# Patient Record
Sex: Female | Born: 1985 | Race: White | Hispanic: No | Marital: Single | Smoking: Former smoker
Health system: Southern US, Community
[De-identification: ages and names within clinical notes are randomized; demographics above are authoritative.]

## PROBLEM LIST (undated history)

## (undated) DIAGNOSIS — N879 Dysplasia of cervix uteri, unspecified: Secondary | ICD-10-CM

## (undated) DIAGNOSIS — IMO0002 Reserved for concepts with insufficient information to code with codable children: Secondary | ICD-10-CM

## (undated) DIAGNOSIS — M1990 Unspecified osteoarthritis, unspecified site: Secondary | ICD-10-CM

## (undated) DIAGNOSIS — N200 Calculus of kidney: Secondary | ICD-10-CM

## (undated) DIAGNOSIS — F32A Depression, unspecified: Secondary | ICD-10-CM

## (undated) DIAGNOSIS — F329 Major depressive disorder, single episode, unspecified: Secondary | ICD-10-CM

## (undated) DIAGNOSIS — D649 Anemia, unspecified: Secondary | ICD-10-CM

## (undated) DIAGNOSIS — B009 Herpesviral infection, unspecified: Secondary | ICD-10-CM

## (undated) DIAGNOSIS — D75839 Thrombocytosis, unspecified: Secondary | ICD-10-CM

## (undated) DIAGNOSIS — F419 Anxiety disorder, unspecified: Secondary | ICD-10-CM

## (undated) HISTORY — DX: Anxiety disorder, unspecified: F41.9

## (undated) HISTORY — DX: Calculus of kidney: N20.0

## (undated) HISTORY — DX: Unspecified osteoarthritis, unspecified site: M19.90

## (undated) HISTORY — DX: Major depressive disorder, single episode, unspecified: F32.9

## (undated) HISTORY — DX: Herpesviral infection, unspecified: B00.9

## (undated) HISTORY — PX: GUM SURGERY: SHX658

## (undated) HISTORY — DX: Reserved for concepts with insufficient information to code with codable children: IMO0002

## (undated) HISTORY — DX: Anemia, unspecified: D64.9

## (undated) HISTORY — DX: Depression, unspecified: F32.A

## (undated) HISTORY — DX: Dysplasia of cervix uteri, unspecified: N87.9

---
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
## 2003-01-27 ENCOUNTER — Other Ambulatory Visit: Admission: RE | Admit: 2003-01-27 | Discharge: 2003-01-27 | Payer: Self-pay | Admitting: Gynecology

## 2004-01-30 ENCOUNTER — Other Ambulatory Visit: Admission: RE | Admit: 2004-01-30 | Discharge: 2004-01-30 | Payer: Self-pay | Admitting: Gynecology

## 2005-02-12 ENCOUNTER — Other Ambulatory Visit: Admission: RE | Admit: 2005-02-12 | Discharge: 2005-02-12 | Payer: Self-pay | Admitting: Gynecology

## 2006-02-13 ENCOUNTER — Other Ambulatory Visit: Admission: RE | Admit: 2006-02-13 | Discharge: 2006-02-13 | Payer: Self-pay | Admitting: Gynecology

## 2006-06-30 ENCOUNTER — Ambulatory Visit: Payer: Self-pay | Admitting: Gastroenterology

## 2006-06-30 ENCOUNTER — Ambulatory Visit: Payer: Self-pay | Admitting: *Deleted

## 2006-06-30 LAB — CONVERTED CEMR LAB
Basophils Relative: 0.1 % (ref 0.0–1.0)
Bilirubin, Direct: 0.1 mg/dL (ref 0.0–0.3)
CO2: 25 meq/L (ref 19–32)
Creatinine, Ser: 0.8 mg/dL (ref 0.4–1.2)
Eosinophils Relative: 0.7 % (ref 0.0–5.0)
GFR calc Af Amer: 114 mL/min
Glucose, Bld: 106 mg/dL — ABNORMAL HIGH (ref 70–99)
HCT: 35.1 % — ABNORMAL LOW (ref 36.0–46.0)
Hemoglobin, Urine: NEGATIVE
Hemoglobin: 12.1 g/dL (ref 12.0–15.0)
Ketones, ur: NEGATIVE mg/dL
Leukocytes, UA: NEGATIVE
Lymphocytes Relative: 36.8 % (ref 12.0–46.0)
Monocytes Absolute: 0.4 10*3/uL (ref 0.2–0.7)
Mucus, UA: NEGATIVE
Neutro Abs: 3.4 10*3/uL (ref 1.4–7.7)
Neutrophils Relative %: 56.3 % (ref 43.0–77.0)
Potassium: 4.1 meq/L (ref 3.5–5.1)
RBC / HPF: NONE SEEN
RDW: 12.1 % (ref 11.5–14.6)
Total Bilirubin: 0.7 mg/dL (ref 0.3–1.2)
Total Protein, Urine: NEGATIVE mg/dL
Total Protein: 6.3 g/dL (ref 6.0–8.3)
Urine Glucose: NEGATIVE mg/dL
Urobilinogen, UA: 0.2 (ref 0.0–1.0)
WBC: 6 10*3/uL (ref 4.5–10.5)
pH: 6.5 (ref 5.0–8.0)

## 2007-03-05 ENCOUNTER — Other Ambulatory Visit: Admission: RE | Admit: 2007-03-05 | Discharge: 2007-03-05 | Payer: Self-pay | Admitting: Gynecology

## 2008-03-10 ENCOUNTER — Encounter: Payer: Self-pay | Admitting: Gynecology

## 2008-03-10 ENCOUNTER — Ambulatory Visit: Payer: Self-pay | Admitting: Gynecology

## 2008-03-10 ENCOUNTER — Other Ambulatory Visit: Admission: RE | Admit: 2008-03-10 | Discharge: 2008-03-10 | Payer: Self-pay | Admitting: Gynecology

## 2008-03-17 ENCOUNTER — Ambulatory Visit: Payer: Self-pay | Admitting: Gynecology

## 2008-03-22 ENCOUNTER — Ambulatory Visit: Payer: Self-pay | Admitting: Gynecology

## 2008-09-14 ENCOUNTER — Encounter: Payer: Self-pay | Admitting: Gynecology

## 2008-09-14 ENCOUNTER — Other Ambulatory Visit: Admission: RE | Admit: 2008-09-14 | Discharge: 2008-09-14 | Payer: Self-pay | Admitting: Gynecology

## 2008-09-14 ENCOUNTER — Ambulatory Visit: Payer: Self-pay | Admitting: Gynecology

## 2009-01-30 ENCOUNTER — Ambulatory Visit: Payer: Self-pay | Admitting: Gynecology

## 2009-03-16 ENCOUNTER — Ambulatory Visit: Payer: Self-pay | Admitting: Gynecology

## 2009-03-16 ENCOUNTER — Other Ambulatory Visit: Admission: RE | Admit: 2009-03-16 | Discharge: 2009-03-16 | Payer: Self-pay | Admitting: Gynecology

## 2009-03-29 ENCOUNTER — Ambulatory Visit: Payer: Self-pay | Admitting: Gynecology

## 2009-08-31 ENCOUNTER — Ambulatory Visit: Payer: Self-pay | Admitting: Gynecology

## 2009-08-31 ENCOUNTER — Other Ambulatory Visit: Admission: RE | Admit: 2009-08-31 | Discharge: 2009-08-31 | Payer: Self-pay | Admitting: Gynecology

## 2009-10-04 ENCOUNTER — Ambulatory Visit: Payer: Self-pay | Admitting: Gynecology

## 2009-10-17 ENCOUNTER — Ambulatory Visit: Payer: Self-pay | Admitting: Gynecology

## 2009-11-20 ENCOUNTER — Ambulatory Visit: Payer: Self-pay | Admitting: Gynecology

## 2010-03-22 ENCOUNTER — Ambulatory Visit: Payer: Self-pay | Admitting: Gynecology

## 2010-03-31 HISTORY — PX: CERVICAL BIOPSY  W/ LOOP ELECTRODE EXCISION: SUR135

## 2010-04-05 ENCOUNTER — Other Ambulatory Visit
Admission: RE | Admit: 2010-04-05 | Discharge: 2010-04-05 | Payer: Self-pay | Source: Home / Self Care | Admitting: Gynecology

## 2010-04-12 ENCOUNTER — Ambulatory Visit
Admission: RE | Admit: 2010-04-12 | Discharge: 2010-04-12 | Payer: Self-pay | Source: Home / Self Care | Attending: Gynecology | Admitting: Gynecology

## 2010-04-25 ENCOUNTER — Other Ambulatory Visit: Payer: Self-pay | Admitting: Gynecology

## 2010-04-25 ENCOUNTER — Ambulatory Visit
Admission: RE | Admit: 2010-04-25 | Discharge: 2010-04-25 | Payer: Self-pay | Source: Home / Self Care | Attending: Gynecology | Admitting: Gynecology

## 2010-04-29 ENCOUNTER — Ambulatory Visit
Admission: RE | Admit: 2010-04-29 | Discharge: 2010-04-29 | Payer: Self-pay | Source: Home / Self Care | Attending: Gynecology | Admitting: Gynecology

## 2010-08-16 NOTE — Assessment & Plan Note (Signed)
[facility]                         GASTROENTEROLOGY OFFICE NOTE   Katie, Green                     MRN:          [id number]  DATE:06/30/2006                            DOB:          02-Dec-1985    REFERRING PHYSICIAN:  Benard Rink. Mackler, D.D.S.   NEW GI CONSULTATION   PRIMARY CARE PHYSICIAN:  Oswaldo Conroy Black, DO   REASON FOR REFERRAL:  Dr. Margaretha Seeds asked me to evaluate Katie Green in  consultation regarding chronic intermittent abdominal pains.   Katie Green is a very pleasant 25 year old woman who has had 3 to 4 years  of intermittent left lower quadrant abdominal pain. She says the pains  are acute, they sometimes double her over. They can last generally for 4  to 5 days. She does not have nausea, vomiting, constipation or diarrhea  around the time of these pains. No fevers or chills. She is not clear if  any certain foods tends to exacerbate it. She has had this looked into  in the past with abdominal ultrasound done November 2007, this was  normal. Pelvic ultrasound done November 2007, was also normal. She tells  me she had a urinalysis at Urgent Care 6 to 8 months ago and that was  negative. During the time of pain, she tries ibuprofen. The pain usually  subsides after 4 to 5 days. There is a suspicion that she may have had  diverticulitis through these, but no definite evidence.   She had a repeat bout of this pain beginning on Saturday. It is her  typical left lower quadrant discomfort, severe pain that caused doubling  over. She was however able to work out the past couple of days including  a lot of abdominal exercises. She has had no fevers, chills, nausea,  vomiting. No constipation or diarrhea. Prior to this visit, she had a  CBC, complete metabolic profile and those were both normal. She had a  stat CT scan done of the abdomen and pelvis with IV and oral contrast  and this was normal without any signs of diverticulitis or  other  abdominal pathology.   She currently says her pain is much improved.   REVIEW OF SYSTEMS:  Is noted for stable weight, but otherwise  essentially normal and is available on her nursing intake sheet.   PAST MEDICAL HISTORY:  Intermittent abdominal pains as above.   CURRENT MEDICATIONS:  Yasmin pill.   ALLERGIES:  No known drug allergies.   SOCIAL HISTORY:  Single. 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 Smokes cigarettes and drinks alcohol periodically.   Last menstrual cycle was one week ago and it was normal.   FAMILY HISTORY:  No colon cancer or colon polyps in family.   PHYSICAL EXAMINATION:  Height 5 feet, 3 inches; 105 pounds. Blood  pressure 100/62, pulse 64.  CONSTITUTIONAL: Generally well-appearing.  NEUROLOGIC: Alert and oriented x3.  EYES: Extraocular movements intact.  MOUTH: Oropharynx moist. No lesions.  NECK: Supple. No lymphadenopathy.  CARDIOVASCULAR: HEART: Regular rate and rhythm.  LUNGS:  Clear to auscultation bilaterally.  ABDOMEN: Soft and nontender,  nondistended. Normal bowel sounds.  EXTREMITIES: No lower extremity edema.  SKIN: No rashes or lesions on visible extremities.   ASSESSMENT/PLAN:  A 25 year old woman with chronic intermittent left  lower quadrant discomfort.   CBC, complete metabolic profile and CT scan today were all normal. I  think it is very unlikely she has anything serious going on and there  are no signs of diverticulitis. She has had pelvic ultrasound that was  essentially normal. Perhaps this is a bowel spasm. What is unusual  though is that she has no other bowel symptoms. She has normal bowel  movement on a daily basis. No constipation, diarrhea, bleeding, nausea  or vomiting. I will try her on Levbid which is a twice daily  antispasmodic medicine to see if that helps. Another possibility is that  she has a left-sided kidney stone. She did have a urinalysis in the  past. She tells me it showed no blood.  I will repeat that for her today.  I think it is likely that these are functional discomforts in the fact  that she was able to do a lot of abdominal exercises with this severe  abdominal pain.     Rachael Fee, MD  Electronically Signed    DPJ/MedQ  DD: 06/30/2006  DT: 06/30/2006  Job #: [id number]   cc:   Carma Leaven, DO

## 2010-10-11 ENCOUNTER — Other Ambulatory Visit: Payer: Self-pay | Admitting: Gynecology

## 2010-10-11 ENCOUNTER — Other Ambulatory Visit (HOSPITAL_COMMUNITY)
Admission: RE | Admit: 2010-10-11 | Discharge: 2010-10-11 | Disposition: A | Discharge status: Home / Self Care | Payer: 59 | Source: Ambulatory Visit | Attending: Gynecology | Admitting: Gynecology

## 2010-10-11 ENCOUNTER — Ambulatory Visit (INDEPENDENT_AMBULATORY_CARE_PROVIDER_SITE_OTHER): Payer: 59 | Admitting: Gynecology

## 2010-10-11 DIAGNOSIS — R87619 Unspecified abnormal cytological findings in specimens from cervix uteri: Secondary | ICD-10-CM | POA: Insufficient documentation

## 2010-10-11 DIAGNOSIS — N871 Moderate cervical dysplasia: Secondary | ICD-10-CM

## 2010-10-11 DIAGNOSIS — Z3041 Encounter for surveillance of contraceptive pills: Secondary | ICD-10-CM

## 2010-10-18 ENCOUNTER — Ambulatory Visit (INDEPENDENT_AMBULATORY_CARE_PROVIDER_SITE_OTHER): Payer: 59 | Admitting: Gynecology

## 2010-10-18 ENCOUNTER — Other Ambulatory Visit: Payer: Self-pay | Admitting: Gynecology

## 2010-10-18 DIAGNOSIS — N9089 Other specified noninflammatory disorders of vulva and perineum: Secondary | ICD-10-CM

## 2010-10-18 DIAGNOSIS — N870 Mild cervical dysplasia: Secondary | ICD-10-CM

## 2010-10-18 DIAGNOSIS — N760 Acute vaginitis: Secondary | ICD-10-CM

## 2010-10-22 ENCOUNTER — Telehealth: Payer: Self-pay | Admitting: *Deleted

## 2010-10-22 NOTE — Telephone Encounter (Signed)
PT INFORMED WITH THE BELOW NOTE AND TO F/U PAP IN . RECALL LETTER IN COMPUTER.

## 2010-10-22 NOTE — Telephone Encounter (Signed)
Message copied by Aura Camps on Tue Oct 22, 2010 10:28 AM ------      Message from: Dara Lords      Created: Tue Oct 22, 2010  9:42 AM       Tell patient that her Pap smear showed low-grade changes and I recommend repeat Pap smear in 6 months.

## 2010-11-07 ENCOUNTER — Telehealth: Payer: Self-pay | Admitting: *Deleted

## 2010-11-07 DIAGNOSIS — B9689 Other specified bacterial agents as the cause of diseases classified elsewhere: Secondary | ICD-10-CM

## 2010-11-07 MED ORDER — TINIDAZOLE 500 MG PO TABS: 2.0000 g | ORAL_TABLET | Freq: Once | ORAL | Status: AC

## 2010-11-07 NOTE — Telephone Encounter (Signed)
Tindamax 2 g daily x2 doses is okay but I do not want to give her refills, I want to know if she is having a recurrence.

## 2010-11-07 NOTE — Telephone Encounter (Signed)
PT INFORMED WITH THE BELOW NOTE. 

## 2010-11-07 NOTE — Telephone Encounter (Signed)
PT CALLED STATING THAT SHE THINKS SHE MAY HAVE AN BV INFECTION C/O BAD ODOR WITH LIGHT BROWNISH DISCHARGE. PT WAS GIVEN TINDAMAX ON LAST OFFICE VISIT FOR THIS ON 10/18/10, PT WOULD LIKE TINDAMAX THIS TIME AS WELL WITH REFILLS IF POSSIBLE PER PT REQUEST PLEASE ADVISE.

## 2011-03-12 ENCOUNTER — Ambulatory Visit (INDEPENDENT_AMBULATORY_CARE_PROVIDER_SITE_OTHER): Payer: 59 | Admitting: Internal Medicine

## 2011-03-12 DIAGNOSIS — M545 Low back pain, unspecified: Secondary | ICD-10-CM

## 2011-03-12 DIAGNOSIS — F341 Dysthymic disorder: Secondary | ICD-10-CM

## 2011-03-12 DIAGNOSIS — M79609 Pain in unspecified limb: Secondary | ICD-10-CM

## 2011-03-12 DIAGNOSIS — G4700 Insomnia, unspecified: Secondary | ICD-10-CM

## 2011-04-07 ENCOUNTER — Other Ambulatory Visit (HOSPITAL_COMMUNITY)
Admission: RE | Admit: 2011-04-07 | Discharge: 2011-04-07 | Disposition: A | Discharge status: Home / Self Care | Payer: BC Managed Care – PPO | Source: Ambulatory Visit | Attending: Gynecology | Admitting: Gynecology

## 2011-04-07 ENCOUNTER — Other Ambulatory Visit: Payer: Self-pay | Admitting: Gynecology

## 2011-04-07 ENCOUNTER — Encounter: Payer: Self-pay | Admitting: Gynecology

## 2011-04-07 ENCOUNTER — Ambulatory Visit (INDEPENDENT_AMBULATORY_CARE_PROVIDER_SITE_OTHER): Payer: BC Managed Care – PPO | Admitting: Gynecology

## 2011-04-07 VITALS — BP 112/66 | Ht 63.0 in | Wt 93.0 lb

## 2011-04-07 DIAGNOSIS — Z01419 Encounter for gynecological examination (general) (routine) without abnormal findings: Secondary | ICD-10-CM | POA: Insufficient documentation

## 2011-04-07 DIAGNOSIS — F419 Anxiety disorder, unspecified: Secondary | ICD-10-CM | POA: Insufficient documentation

## 2011-04-07 DIAGNOSIS — IMO0002 Reserved for concepts with insufficient information to code with codable children: Secondary | ICD-10-CM

## 2011-04-07 DIAGNOSIS — Z3041 Encounter for surveillance of contraceptive pills: Secondary | ICD-10-CM

## 2011-04-07 DIAGNOSIS — N879 Dysplasia of cervix uteri, unspecified: Secondary | ICD-10-CM

## 2011-04-07 DIAGNOSIS — B009 Herpesviral infection, unspecified: Secondary | ICD-10-CM | POA: Insufficient documentation

## 2011-04-07 DIAGNOSIS — Z131 Encounter for screening for diabetes mellitus: Secondary | ICD-10-CM

## 2011-04-07 DIAGNOSIS — R87612 Low grade squamous intraepithelial lesion on cytologic smear of cervix (LGSIL): Secondary | ICD-10-CM

## 2011-04-07 DIAGNOSIS — Z13220 Encounter for screening for lipoid disorders: Secondary | ICD-10-CM

## 2011-04-07 DIAGNOSIS — Z1159 Encounter for screening for other viral diseases: Secondary | ICD-10-CM | POA: Insufficient documentation

## 2011-04-07 LAB — GLUCOSE, RANDOM: Glucose, Bld: 93 mg/dL (ref 70–99)

## 2011-04-07 LAB — URINALYSIS, MICROSCOPIC ONLY
Casts: NONE SEEN
Crystals: NONE SEEN

## 2011-04-07 LAB — URINALYSIS, ROUTINE W REFLEX MICROSCOPIC
Hgb urine dipstick: NEGATIVE
Leukocytes, UA: NEGATIVE
Nitrite: NEGATIVE
Protein, ur: 30 mg/dL — AB
Urobilinogen, UA: 0.2 mg/dL (ref 0.0–1.0)

## 2011-04-07 LAB — LIPID PANEL: Total CHOL/HDL Ratio: 2.5 Ratio

## 2011-04-07 MED ORDER — NORETHINDRONE ACET-ETHINYL EST 1-20 MG-MCG PO TABS: 1.0000 | ORAL_TABLET | Freq: Every day | ORAL | Status: DC

## 2011-04-07 NOTE — Patient Instructions (Signed)
Follow up for Pap smear in 6 months. If any problems switching over to the new birth control pill call the office.

## 2011-04-07 NOTE — Progress Notes (Signed)
Katie Green [birth year] [id number]        26 y.o.  for annual exam.  Several issues noted below.  Past medical history,surgical history, medications, allergies, family history and social history were all reviewed and documented in the EPIC chart. ROS:  Was performed and pertinent positives and negatives are included in the history.  Exam: chaperone present Filed Vitals:   04/07/11 1154  BP: 112/66   General appearance  Normal Skin grossly normal Head/Neck normal with no cervical or supraclavicular adenopathy thyroid normal Lungs  clear Cardiac RR, without RMG Abdominal  soft, nontender, without masses, organomegaly or hernia Breasts  examined lying and sitting without masses, retractions, discharge or axillary adenopathy. Pelvic  Ext/BUS/vagina  normal   Cervix  normal  Pap done  Uterus  anteverted, normal size, shape and contour, midline and mobile nontender   Adnexa  Without masses or tenderness    Anus and perineum  normal     Assessment/Plan:  26 y.o. female for annual exam.    1. Cervical dysplasia. Patient has history of LEEP January 2012 showing high-grade dysplasia with clear margins. Her first follow up Pap smear July showed low-grade SIL. Colposcopy and biopsy showed low-grade SIL. Pap was done today. If normal or low-grade we'll plan expectant management with repeat Pap smear in 6 months the patient knows importance of follow up. 2. Birth control. Patient is on Yasmin. She does continue to smoke. I discussed the thrombosis risk to include stroke heart attack DVT. After a lengthy discussion we both agree to switch to a different pill I prescribed the Loestrin 120 equivalent x1 year. Back up contraception first pack was reviewed. 3. Cigarette smoking. I again discussed stop smoking with her. She is very stressed this point and does not want to consider that. 4. Breast health. SBE monthly reviewed. 5. HSV. Patient is having some outbreaks of HSV have been using  intermittent Valtrex. I recommended daily Valtrex. She has a has a prescription through Dr. Merla Riches and will start on daily suppressive therapy. She continues to have breakthrough outbreaks she'll let me know. 6. Anxiety. She is on Celexa and when necessary Xanax per Dr. Merla Riches and will continue follow up with him in reference to this. 7. Health maintenance. Baseline CBC, glucose, lipid profile, urinalysis ordered. Assuming Pap smear low grade or normal then she will follow up in 6 months for repeat Pap smear.    Dara Lords MD, 12:27 PM 04/07/2011

## 2011-04-08 LAB — CBC WITH DIFFERENTIAL/PLATELET
Basophils Absolute: 0 10*3/uL (ref 0.0–0.1)
HCT: 36.9 % (ref 36.0–46.0)
Lymphocytes Relative: 43 % (ref 12–46)
Monocytes Absolute: 0.4 10*3/uL (ref 0.1–1.0)
Neutro Abs: 4 10*3/uL (ref 1.7–7.7)
RDW: 13.1 % (ref 11.5–15.5)
WBC: 7.6 10*3/uL (ref 4.0–10.5)

## 2011-04-14 ENCOUNTER — Other Ambulatory Visit: Payer: Self-pay | Admitting: Gynecology

## 2011-04-15 ENCOUNTER — Telehealth: Payer: Self-pay | Admitting: *Deleted

## 2011-04-15 ENCOUNTER — Ambulatory Visit: Payer: 59 | Admitting: Sports Medicine

## 2011-04-15 ENCOUNTER — Encounter: Payer: Self-pay | Admitting: Gynecology

## 2011-04-15 NOTE — Telephone Encounter (Signed)
Pt calling wanting recent pap results. Please advise

## 2011-04-15 NOTE — Telephone Encounter (Signed)
Tell patient that Pap smear showed ASCUS with negative high-risk HPV. This is less atypia than her last Pap smear. Recommend repeat Pap smear in 6 months.

## 2011-04-15 NOTE — Telephone Encounter (Signed)
Pt informed with the below note, recall letter in computer. 

## 2011-04-22 ENCOUNTER — Ambulatory Visit: Payer: 59 | Admitting: Sports Medicine

## 2011-05-02 ENCOUNTER — Telehealth: Payer: Self-pay

## 2011-05-02 DIAGNOSIS — M79672 Pain in left foot: Secondary | ICD-10-CM

## 2011-05-02 NOTE — Telephone Encounter (Signed)
.  [facility] PT IN NEED OF HER PERCOCET. PLEASE CALL [phone] WHEN READY FOR P/U

## 2011-05-05 ENCOUNTER — Ambulatory Visit: Payer: Self-pay | Admitting: Sports Medicine

## 2011-05-06 ENCOUNTER — Telehealth: Payer: Self-pay

## 2011-05-06 NOTE — Telephone Encounter (Signed)
PT STATES CALLED Friday 05/01/10 FOR RX REFILL ON PERVOCET, NOW SHE IS COMPLETELY OUT PLEASE CALL WHEN READY

## 2011-05-07 ENCOUNTER — Encounter: Payer: Self-pay | Admitting: Internal Medicine

## 2011-05-07 MED ORDER — OXYCODONE-ACETAMINOPHEN 5-325 MG PO TABS: 1.0000 | ORAL_TABLET | Freq: Two times a day (BID) | ORAL | Status: AC | PRN

## 2011-05-07 NOTE — Telephone Encounter (Signed)
Dr. Merla Riches, This chart is in your box.   Thanks, csj

## 2011-05-07 NOTE — Telephone Encounter (Signed)
Pt has been called that Rx is ready to pick up.

## 2011-05-07 NOTE — Telephone Encounter (Signed)
LMOM THAT RX IS READY FOR PICKUP 

## 2011-05-07 NOTE — Telephone Encounter (Signed)
Written 5/325 #60 left ....have asst leave at front and notify pat

## 2011-05-07 NOTE — Telephone Encounter (Signed)
Please pull this chart. csj 

## 2011-05-12 ENCOUNTER — Ambulatory Visit: Payer: Self-pay | Admitting: Sports Medicine

## 2011-05-12 ENCOUNTER — Other Ambulatory Visit: Payer: Self-pay | Admitting: Family Medicine

## 2011-05-12 MED ORDER — HYDROCODONE-IBUPROFEN 7.5-200 MG PO TABS: 1.0000 | ORAL_TABLET | Freq: Two times a day (BID) | ORAL | Status: DC

## 2011-05-13 ENCOUNTER — Other Ambulatory Visit: Payer: Self-pay | Admitting: Physician Assistant

## 2011-05-13 MED ORDER — HYDROCODONE-IBUPROFEN 7.5-200 MG PO TABS: 1.0000 | ORAL_TABLET | Freq: Two times a day (BID) | ORAL | Status: AC

## 2011-05-16 ENCOUNTER — Telehealth: Payer: Self-pay

## 2011-05-16 DIAGNOSIS — G8929 Other chronic pain: Secondary | ICD-10-CM

## 2011-05-16 NOTE — Telephone Encounter (Signed)
Pt would like a refill on her Vicoprofen

## 2011-05-21 NOTE — Telephone Encounter (Signed)
Patient calling again concerning her rx refill on vicoprofen she say no one has returned her call since last week.

## 2011-05-23 MED ORDER — OXYCODONE-ACETAMINOPHEN 5-325 MG PO TABS: 1.0000 | ORAL_TABLET | ORAL | Status: AC

## 2011-05-23 MED ORDER — HYDROCODONE-IBUPROFEN 5-200 MG PO TABS
1.0000 | ORAL_TABLET | Freq: Four times a day (QID) | ORAL | Status: AC | PRN
Start: 2011-05-23 — End: 2011-06-02

## 2011-05-23 NOTE — Telephone Encounter (Signed)
Dr. Merla Riches last saw this patient 03/12/11.  She was referred for her pain and I do not see that he intended to refill the Vicoprofen and Percocet beyond the refill 03/21/2011.  I'm forwarding this to him for review.

## 2011-05-23 NOTE — Telephone Encounter (Signed)
Pt stated that she received her rx for vicoprofen and the pharmacy sent in an old script for BID and pt is currently taking 1 QID.  Also she wants a refill on her Percocet.  Chart is at nurses station

## 2011-05-23 NOTE — Telephone Encounter (Signed)
LMOM that Rxs ready for p//up

## 2011-05-23 NOTE — Telephone Encounter (Signed)
Hasn't gotten to DrFields yet. appt next week Ok to refill Vicopr at qid and give percocet for after workday but should see me before further meds so tell her to mak appt at 104 I printed 2.

## 2011-05-30 ENCOUNTER — Other Ambulatory Visit: Payer: Self-pay | Admitting: Family Medicine

## 2011-05-30 MED ORDER — ALPRAZOLAM 0.5 MG PO TABS: 0.5000 mg | ORAL_TABLET | Freq: Three times a day (TID) | ORAL | Status: AC | PRN

## 2011-06-11 ENCOUNTER — Ambulatory Visit: Payer: 59 | Admitting: Internal Medicine

## 2011-06-21 ENCOUNTER — Telehealth: Payer: Self-pay

## 2011-06-21 MED ORDER — HYDROCODONE-IBUPROFEN 7.5-200 MG PO TABS: 1.0000 | ORAL_TABLET | Freq: Four times a day (QID) | ORAL | Status: DC | PRN

## 2011-06-21 NOTE — Telephone Encounter (Signed)
One of her prescription refills was sent here on Thursday from the pharmacy and they still have not heard from [place].  Please call

## 2011-06-21 NOTE — Telephone Encounter (Signed)
Advised pt that she need an office visit. Pt has an appt with Dr. Merla Riches on 07/02/11.

## 2011-06-21 NOTE — Telephone Encounter (Signed)
Informed pt that rx is sent in

## 2011-06-27 ENCOUNTER — Telehealth: Payer: Self-pay

## 2011-06-27 NOTE — Telephone Encounter (Signed)
Dr. Merla Riches: Patient came in today to see you but the wait was too long. She has an appt with you on Wednesday but needs to speak with you about a few things before then if possible.

## 2011-06-28 NOTE — Telephone Encounter (Signed)
Pt called back again today states she tried to come in today to be eval. For a broken toe on her other foot and the wait was 3 hours and she could not wait she needs to speak with dr Merla Riches about getting hydrocodone called in for her and would like percocet on top of that because of the pain she is in, she cannot tolerate pain in both feet and needs something stronger than we already called in for her

## 2011-06-29 ENCOUNTER — Telehealth: Payer: Self-pay

## 2011-06-29 MED ORDER — HYDROCODONE-IBUPROFEN 7.5-200 MG PO TABS: 1.0000 | ORAL_TABLET | Freq: Four times a day (QID) | ORAL | Status: DC | PRN

## 2011-06-29 MED ORDER — OXYCODONE-ACETAMINOPHEN 5-325 MG PO TABS: 1.0000 | ORAL_TABLET | Freq: Three times a day (TID) | ORAL | Status: DC | PRN

## 2011-06-29 NOTE — Telephone Encounter (Signed)
She has an appointment in 3 days and so I will give her medication until she can get in for further evaluation/she had been scheduled for sports medicine evaluation and I will check on that as well.

## 2011-07-01 ENCOUNTER — Encounter: Payer: Self-pay | Admitting: Sports Medicine

## 2011-07-01 ENCOUNTER — Ambulatory Visit (INDEPENDENT_AMBULATORY_CARE_PROVIDER_SITE_OTHER): Payer: BC Managed Care – PPO | Admitting: Sports Medicine

## 2011-07-01 VITALS — BP 128/86 | HR 108 | Ht 63.0 in | Wt 96.0 lb

## 2011-07-01 DIAGNOSIS — M79609 Pain in unspecified limb: Secondary | ICD-10-CM

## 2011-07-01 DIAGNOSIS — M2170 Unequal limb length (acquired), unspecified site: Secondary | ICD-10-CM | POA: Insufficient documentation

## 2011-07-01 DIAGNOSIS — M79672 Pain in left foot: Secondary | ICD-10-CM | POA: Insufficient documentation

## 2011-07-01 NOTE — Assessment & Plan Note (Signed)
We will fit her for an arch strap  Sports and soles that provide some cushioning to her arch Her orthotics that were made before felt like they were 2 firm  I would like her to try this for 4-6 weeks and see how the foot is doing at that time.

## 2011-07-01 NOTE — Assessment & Plan Note (Signed)
This may be enough to add some stress to her left foot considering her height  We will add a correction to the sports and soles and she should use some correction for any shoes when she exercises or walks or stands for prolonged time

## 2011-07-01 NOTE — Patient Instructions (Signed)
Please use green insoles as much as possible to correct leg length difference  Use arch strap on left foot over painful area when you are on feet a lot - ok to take strap off if it gets too tight  Please follow up in 6 weeks  Thank you for seeing [place] today!

## 2011-07-01 NOTE — Progress Notes (Signed)
  Subjective:    Patient ID: Katie Green, female    DOB: 22-Feb-1986, 26 y.o.   MRN: [id number]  HPI  Pt presents to clinic for evaluation of lt mid foot pain since 05/2010.   Injured when she twisted and stepped awkwardly going downstairs.  She has been evaluated by Dr. Dion Saucier and [place] [facility] and had x-rays that were negative and MRI showed bone bruise.  Has midfoot pain constantly, worse with increased walking. Works as a Sales executive, on feet all day.  Takes vicoprofen and percocet prn for foot pain.  Unable to do normal exercise activities 2/2 pain.   While this is not as severe as when she first injured the foot she continues to have some swelling more in the lateral dorsum of the midfoot on almost every day that she works   Review of Systems     Objective:   Physical Exam Pleasant, thin and in no acute distress  Mid foot swelling on lt more lat than med Lt leg 1 cm shorter than rt- lt 86cm, rt 85 cm No scoliosis   Lt ankle: Exam normal  AT normal  Mid foot squeeze painful There is some moderate swelling over the lis franc joint laterally Cuboid is not unstable Movement of forefoot while stabilizing the midfoot is painful  Forefoot squeeze non painful Good preservation of arches bilat   MSK ultrasound   in the lis franc joint there is a small calcification and some slight increase of hypoechoic fluid   the lateral TMT joints are normal The fourth metatarsal shaft show some possible callus No sign of stress fracture is noted Ankle ligaments were scanned and are normal as are the tendons medially and laterally      Assessment & Plan:

## 2011-07-02 ENCOUNTER — Ambulatory Visit (INDEPENDENT_AMBULATORY_CARE_PROVIDER_SITE_OTHER): Payer: BC Managed Care – PPO | Admitting: Internal Medicine

## 2011-07-02 VITALS — BP 125/79 | HR 112 | Temp 98.0°F | Resp 22 | Ht 64.0 in | Wt 97.6 lb

## 2011-07-02 DIAGNOSIS — F17200 Nicotine dependence, unspecified, uncomplicated: Secondary | ICD-10-CM | POA: Insufficient documentation

## 2011-07-02 DIAGNOSIS — F341 Dysthymic disorder: Secondary | ICD-10-CM

## 2011-07-02 DIAGNOSIS — F419 Anxiety disorder, unspecified: Secondary | ICD-10-CM

## 2011-07-02 DIAGNOSIS — M79672 Pain in left foot: Secondary | ICD-10-CM

## 2011-07-02 MED ORDER — CITALOPRAM HYDROBROMIDE 40 MG PO TABS: 40.0000 mg | ORAL_TABLET | Freq: Every day | ORAL | Status: DC

## 2011-07-02 MED ORDER — HYDROCODONE-IBUPROFEN 7.5-200 MG PO TABS: 1.0000 | ORAL_TABLET | Freq: Four times a day (QID) | ORAL | Status: AC | PRN

## 2011-07-02 MED ORDER — ALPRAZOLAM 0.5 MG PO TABS: 0.5000 mg | ORAL_TABLET | Freq: Three times a day (TID) | ORAL | Status: DC | PRN

## 2011-07-02 NOTE — Progress Notes (Signed)
  Subjective:    Patient ID: Katie Green, female    DOB: 1985-05-16, 26 y.o.   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 Darrick Penna yesterday who has a diagnosis for her chronic foot pain and has started her on therapy-for this she is very grateful She has a recent toe fracture and some problems with her roommate moving out to represent new stressors but for the most part is in the best shape she's been in over the last year She needs alprazolam occasionally for sleep and occasionally in more stressful daytime events Celexa is still working well She continues with Vicoprofen for her foot pain/she has used occasional Percocet in addition but it is hopeful that now we will get away from pain meds altogether over the next 6 months   Review of Systems     Objective:   Physical Exam Vital signs are stable in particular weight Foot exam is as outlined in Dr. Darrick Penna noted for the left foot The right foot has a resolving third toe fracture that is in good position Mood is stable and affect is appropriate      Assessment & Plan:  Problem #1 depression with anxiety and insomnia Plan refill Celexa 40 #90 one daily with 3 refills Refill alprazolam 0.5 mg #90 one 3 times a day when necessary with 5 refills Increase activities and areas of enjoyment to try to establish new relationships  Problem #2 chronic foot pain Refill Vicoprofen #120 one 4 times a day when necessary with reducing doses as improving refill x2  Followup in 3 months sooner if worse/hopefully we can reduce smoking

## 2011-07-09 ENCOUNTER — Telehealth: Payer: Self-pay

## 2011-07-09 MED ORDER — OXYCODONE-ACETAMINOPHEN 5-325 MG PO TABS: 1.0000 | ORAL_TABLET | Freq: Two times a day (BID) | ORAL | Status: AC | PRN

## 2011-07-09 NOTE — Telephone Encounter (Signed)
Percocet refilled to use one twice a day when necessary in addition to Vicoprofen  Following her toe fracture #30/printed

## 2011-07-09 NOTE — Telephone Encounter (Signed)
Dr. Merla Riches would you please review this request.

## 2011-07-09 NOTE — Telephone Encounter (Signed)
LMOM THAT RX IS READY FOR PICKUP 

## 2011-07-09 NOTE — Telephone Encounter (Signed)
PT IN NEED OF HER PERCOCET. PLEASE CALL [phone] WHEN READY FOR P/U

## 2011-07-23 ENCOUNTER — Telehealth: Payer: Self-pay

## 2011-07-23 NOTE — Telephone Encounter (Signed)
Can we do this rx? 

## 2011-07-23 NOTE — Telephone Encounter (Signed)
Cannot refill without OV, she was given percocet for her foot pain in addition to Vicoprofen (not back pain).

## 2011-07-23 NOTE — Telephone Encounter (Signed)
PT REQUESTING RX REFILL ON PERCOSET, PT BACK IS TERRIBLE SHE HAS APPT TO SEE DR Estill Bamberg.

## 2011-07-24 NOTE — Telephone Encounter (Signed)
Spoke with patient and let her know that we cannot refill the percocet without her rtc.

## 2011-07-24 NOTE — Telephone Encounter (Signed)
LMOM to call back

## 2011-07-28 ENCOUNTER — Telehealth: Payer: Self-pay

## 2011-07-28 NOTE — Telephone Encounter (Signed)
No. No refills without OV. If she is in that much pain she needs evaluation.

## 2011-07-28 NOTE — Telephone Encounter (Signed)
Can we refill this.  I seen on 4/24 that we told her no refills without an office visit?

## 2011-07-28 NOTE — Telephone Encounter (Signed)
Pt requesting refill for percocet,states has an appt with dr Clyda Hurdle around may 10.    Best phone [phone]

## 2011-07-29 NOTE — Telephone Encounter (Signed)
Spoke with patient.  She is going to come see Dr Merla Riches today.

## 2011-07-29 NOTE — Telephone Encounter (Signed)
LMOM to call back

## 2011-08-05 ENCOUNTER — Other Ambulatory Visit: Payer: Self-pay | Admitting: Internal Medicine

## 2011-08-12 ENCOUNTER — Ambulatory Visit: Payer: BC Managed Care – PPO | Admitting: Sports Medicine

## 2011-08-16 ENCOUNTER — Other Ambulatory Visit: Payer: Self-pay | Admitting: Internal Medicine

## 2011-08-16 DIAGNOSIS — G8929 Other chronic pain: Secondary | ICD-10-CM

## 2011-08-16 MED ORDER — OXYCODONE-ACETAMINOPHEN 5-325 MG PO TABS: 1.0000 | ORAL_TABLET | Freq: Every day | ORAL | Status: AC

## 2011-08-16 NOTE — Progress Notes (Signed)
Ongoing evaluation with Dr. Darrick Penna in sports medicine and with PT Ellamae Sia She has one refill left on Vicoprofen Will refill Percocet for use at the end of the day her sleep #30 Followup with me before next meds

## 2011-09-05 ENCOUNTER — Other Ambulatory Visit: Payer: Self-pay | Admitting: Internal Medicine

## 2011-09-11 ENCOUNTER — Ambulatory Visit (INDEPENDENT_AMBULATORY_CARE_PROVIDER_SITE_OTHER): Payer: No Typology Code available for payment source | Admitting: Sports Medicine

## 2011-09-11 VITALS — BP 110/60

## 2011-09-11 DIAGNOSIS — M25579 Pain in unspecified ankle and joints of unspecified foot: Secondary | ICD-10-CM

## 2011-09-11 DIAGNOSIS — G90529 Complex regional pain syndrome I of unspecified lower limb: Secondary | ICD-10-CM

## 2011-09-11 DIAGNOSIS — M79609 Pain in unspecified limb: Secondary | ICD-10-CM

## 2011-09-11 DIAGNOSIS — M79672 Pain in left foot: Secondary | ICD-10-CM

## 2011-09-11 DIAGNOSIS — M7750 Other enthesopathy of unspecified foot: Secondary | ICD-10-CM

## 2011-09-11 NOTE — Progress Notes (Signed)
  Subjective:    Patient ID: Katie Green, female    DOB: 01/21/1986, 26 y.o.   MRN: [id number]  HPI  Katie Green is coming back to f/u on her left foot dorsal pain for 1 1/2 year, she is not better. She has been seen since then by dr. Dion Saucier, had x rays, MRI which showed bone brusing, she has been in a cast as well. She was also seen by Dr. Victorino Dike ( foot / ankle) which recommended her to use insoles. She has been taking vicoprofen 4-5 tablets a day. Dr Darrick Penna saw a calcification on the Lisfranc joint in a previous U/S  Patient Active Problem List  Diagnosis  . Cervical dysplasia  . HSV-2 (herpes simplex virus 2) infection  . Anxiety  . Foot pain, left  . Leg length inequality  . Nicotine dependence   Current Outpatient Prescriptions on File Prior to Visit  Medication Sig Dispense Refill  . ALPRAZolam (XANAX) 0.5 MG tablet Take 1 tablet (0.5 mg total) by mouth 3 (three) times daily as needed.  90 tablet  5  . amphetamine-dextroamphetamine (ADDERALL) 20 MG tablet       . citalopram (CELEXA) 40 MG tablet Take 1 tablet (40 mg total) by mouth daily.  90 tablet  1  . ferrous sulfate 325 (65 FE) MG tablet Take 325 mg by mouth daily with breakfast.        . ibuprofen (ADVIL,MOTRIN) 200 MG tablet Take 200 mg by mouth every 6 (six) hours as needed.        Marland Kitchen LIDODERM 5 % APPLY EVERY DAY AS NEEDED  30 each  1  . Multiple Vitamin (MULTIVITAMIN) capsule Take 1 capsule by mouth daily.        . norethindrone-ethinyl estradiol (MICROGESTIN,JUNEL,LOESTRIN) 1-20 MG-MCG tablet Take 1 tablet by mouth daily.  1 Package  11  . valACYclovir (VALTREX) 500 MG tablet Take 500 mg by mouth daily.        No Known Allergies     Review of Systems  Constitutional: Negative for fever, chills, diaphoresis and fatigue.  Musculoskeletal: Positive for gait problem.  Neurological: Negative for weakness and numbness.       Objective:   Physical Exam  Constitutional: She is oriented to person, place, and time. She  appears well-developed and well-nourished.       BP 110/60   Pulmonary/Chest: Effort normal.  Musculoskeletal:       Left foot pain in the lateral dorsum of the foot. TTP over the sinus tarsi. TTP in the anterior syndesmosis.   Neurological: She is alert and oriented to person, place, and time.  Skin: Skin is warm. No rash noted. No erythema.  Psychiatric: She has a normal mood and affect. Her behavior is normal. Thought content normal.    MSK U/S of both sinus tarsi with no pathology.      Assessment & Plan:   1. Foot pain, left   2. RSD lower limb   3. Sinus tarsi syndrome    amitriptyline 25mg  at bed time for RSD Body hellix ankle strap We could try a sinus tarsi injection if the elavil fails. F/U in 4 weeks.

## 2011-09-12 ENCOUNTER — Telehealth: Payer: Self-pay

## 2011-09-12 NOTE — Telephone Encounter (Signed)
PATIENT WAS REFERRED BY [facility]OL[facility] TO DR. FIELDS FOR HER FOOT.  IT IS STILL NOT BETTER.  DR. FIELDS WOULD RATHER [facility] REFILL HER PERCOCET.  CAN YOU PLEASE REFILL FOR HER.

## 2011-09-15 ENCOUNTER — Encounter: Payer: Self-pay | Admitting: Radiology

## 2011-09-15 ENCOUNTER — Telehealth: Payer: Self-pay

## 2011-09-15 NOTE — Telephone Encounter (Signed)
Patient still waiting for Rx refill. Has been waiting since Friday and is in a lot of pain. Please call asap.

## 2011-09-15 NOTE — Telephone Encounter (Signed)
Dr. 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

## 2011-09-15 NOTE — Telephone Encounter (Signed)
Left message for patient to call back  

## 2011-09-15 NOTE — Telephone Encounter (Signed)
Not getting any relief with the vicoprofen. I did tell her she needs to be stepped off the medications per Dr. Darrick Penna. I did tell her that she will eventually be off all these meds all together. She wonders if there is anything over the counter she can take with the vicoprofen.

## 2011-09-16 NOTE — Telephone Encounter (Signed)
Encounter made in error. 

## 2011-09-17 NOTE — Telephone Encounter (Signed)
LMOM to call back

## 2011-09-17 NOTE — Telephone Encounter (Signed)
With reflex sympathetic dystrophy causing the pain the pain slowly improves as amitriptyline is slowly increased Can add tylenol 2 tabs 4 x a day OTC. Try to be patient-this next period will be a little tough but she should start to see improvement by the time she sees dr fields again

## 2011-09-18 NOTE — Telephone Encounter (Signed)
LMOM TO CB 

## 2011-09-19 NOTE — Telephone Encounter (Signed)
LMOM for pt w/ instr's from Dr Merla Riches and message about when to expect improvement. Asked for CB w/any further ?s/concerns

## 2011-09-19 NOTE — Telephone Encounter (Signed)
Pt CB bc she had not listened to my VM, and agreed to plan although she is still having a lot of pain. Pt asked about RF of Vicoprofen bc when she went to p/up last RF on 5/24, she only got #100 of the #120 d/t ins change. Then when she went to get last RF, she was only given #20 on 09/14/11 and has no further RFs. Verified w/pharmacy that orig Rx was on 4/6 for #120 w/ 2 RFs. Pt got another #120 on 4/29, then #100 on 5/24 and #20 on 6/16, so she has used up her RFs. Dr Merla Riches do you want to write new Rx for more vicoprofen for pt?

## 2011-09-23 MED ORDER — HYDROCODONE-IBUPROFEN 7.5-200 MG PO TABS: 1.0000 | ORAL_TABLET | Freq: Four times a day (QID) | ORAL | Status: DC | PRN

## 2011-09-23 NOTE — Telephone Encounter (Signed)
So next rx by schedule is 7/3 or so I'll print it

## 2011-09-23 NOTE — Addendum Note (Signed)
Addended by: Tonye Pearson on: 09/23/2011 02:14 PM   Modules accepted: Orders

## 2011-09-24 NOTE — Telephone Encounter (Signed)
Okay to call the pharmacy and allow early refill/but only this one time

## 2011-09-24 NOTE — Telephone Encounter (Signed)
Pt CB and apologized for having to CB but she stated that the Rx is written for her to take Q6 hrs, and she is having to take it that often to keep pain level down - she gets up at night to take her dose. So the #20 she got on 6/16 only lasts 5 days and she can not p/up new RF until 7/3. Can we call to allow her to fill now?

## 2011-09-24 NOTE — Telephone Encounter (Signed)
LMOM for pt that Rx is on hold at pharmacy to be filled 7/3 or after.

## 2011-09-25 NOTE — Telephone Encounter (Signed)
LMOM for pt that pharmacy called to give OK to fill early, but only this time - should have enough for a month. [facility] now.

## 2011-10-01 ENCOUNTER — Encounter: Payer: Self-pay | Admitting: Internal Medicine

## 2011-10-01 ENCOUNTER — Ambulatory Visit (INDEPENDENT_AMBULATORY_CARE_PROVIDER_SITE_OTHER): Payer: No Typology Code available for payment source | Admitting: Internal Medicine

## 2011-10-01 VITALS — BP 124/74 | HR 137 | Temp 98.7°F | Resp 16 | Ht 63.0 in | Wt 97.4 lb

## 2011-10-01 DIAGNOSIS — F17200 Nicotine dependence, unspecified, uncomplicated: Secondary | ICD-10-CM

## 2011-10-01 DIAGNOSIS — F419 Anxiety disorder, unspecified: Secondary | ICD-10-CM

## 2011-10-01 DIAGNOSIS — M79673 Pain in unspecified foot: Secondary | ICD-10-CM

## 2011-10-01 DIAGNOSIS — F32A Depression, unspecified: Secondary | ICD-10-CM | POA: Insufficient documentation

## 2011-10-01 DIAGNOSIS — M79672 Pain in left foot: Secondary | ICD-10-CM

## 2011-10-01 DIAGNOSIS — F329 Major depressive disorder, single episode, unspecified: Secondary | ICD-10-CM

## 2011-10-01 DIAGNOSIS — F411 Generalized anxiety disorder: Secondary | ICD-10-CM

## 2011-10-01 DIAGNOSIS — F341 Dysthymic disorder: Secondary | ICD-10-CM

## 2011-10-01 DIAGNOSIS — M79609 Pain in unspecified limb: Secondary | ICD-10-CM

## 2011-10-01 MED ORDER — OXYCODONE-ACETAMINOPHEN 5-325 MG PO TABS: 1.0000 | ORAL_TABLET | Freq: Every day | ORAL | Status: DC

## 2011-10-01 MED ORDER — PIROXICAM 20 MG PO CAPS: 20.0000 mg | ORAL_CAPSULE | Freq: Every day | ORAL | Status: AC

## 2011-10-01 MED ORDER — HYDROCODONE-IBUPROFEN 7.5-200 MG PO TABS: 1.0000 | ORAL_TABLET | Freq: Four times a day (QID) | ORAL | Status: DC | PRN

## 2011-10-01 MED ORDER — HYDROCODONE-ACETAMINOPHEN 10-325 MG PO TABS: 1.0000 | ORAL_TABLET | Freq: Four times a day (QID) | ORAL | Status: AC | PRN

## 2011-10-01 MED ORDER — LIDOCAINE 5 % EX PTCH: 1.0000 | MEDICATED_PATCH | CUTANEOUS | Status: DC

## 2011-10-01 NOTE — Progress Notes (Signed)
Here for three-month followup Patient Active Problem List  Diagnosis     . 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 Anxiety-Still requires Xanax 3 times a day even know her new job is much more suited to her and she has had a very good time there  . Chronic foot pain with RSD-see office notes from sports medicine clinic/she is very optimistic about her involvement there and her potential for getting well-she still requires Vicoprofen 3 or 4 times a day to get through the day at work and is in a tremendous amount of pain when she gets home requiring 1 Percocet at bedtime/she has been able to decrease her use of Percocet/we discussed stopping this entirely the cause of the potential for more dependence and with hydrocodone/amitriptyline does leave her sleepy in the morning and she is reluctant to increase her dose  . Leg length inequality  . Nicotine dependence-Now is not the time to completely quit but she is going to try gradual reduction in the cigarettes    -  Depression-reactive-Very difficult time over the past few months because of her grandfathers aortic valve replacement and the family expecting her to pick up all the pieces with his care at night and on the weekends//For the first time in many months she is spending time with friends over the next week-she realizes she has to make a Change in her relationship with her family/they're all very close/for the most part supportive/her sister has become a problem for her it is no longer her best friend/her sister's children are important to her but she has less contact now   She also complains of a lot of back pain and stiffness in other joints both early in the morning and during the  workday  Exam Vital signs including weight stable Lots of tears during the exam as we discussed her depression and anxiety revolving around recent family events/mood is improved however and affect is appropriate Neurological is  intact   Impression #1 depression and anxiety-continue Celexa and alprazolam/she understands the risk for dependence We'll continue to increase in involvement and positive activities/  #2 chronic foot pain-continue amitriptyline/when this current combination of ibuprofen and Percocet and 6 at the end of this month we'll change her to Norco and stop the Percocet and also use piroxicam 20 mg/she is considering an injection w/ sports medicine  Meds ordered this encounter  Medications  .       . lidocaine (LIDODERM) 5 %    Sig: Place 1 patch onto the skin daily. Remove & Discard patch within 12 hours or as directed by MD    Dispense:  30 patch    Refill:  5  . She has refills of Celexa and Xanax for the next 3 months/              . piroxicam (FELDENE) 20 MG capsule    Sig: Take 1 capsule (20 mg total) by mouth daily.    Dispense:  30 capsule    Refill:  2      to begin 09/23/2011  . HYDROcodone-acetaminophen (NORCO) 10-325 MG per tablet    Sig: Take 1 tablet by mouth every 6 (six) hours as needed for pain.    Dispense:  120 tablet    Refill:  2        to begin 09/23/2011  . oxyCODONE-acetaminophen (PERCOCET) 5-325 MG per tablet    Sig: Take 1 tablet by mouth at bedtime.  Dispense:  25 tablet    Refill:  0        We'll discontinue this on 09/23/2011   Followup 3 months

## 2011-10-07 ENCOUNTER — Other Ambulatory Visit (HOSPITAL_COMMUNITY)
Admission: RE | Admit: 2011-10-07 | Discharge: 2011-10-07 | Disposition: A | Discharge status: Home / Self Care | Payer: No Typology Code available for payment source | Source: Ambulatory Visit | Attending: Gynecology | Admitting: Gynecology

## 2011-10-07 ENCOUNTER — Encounter: Payer: Self-pay | Admitting: Gynecology

## 2011-10-07 ENCOUNTER — Ambulatory Visit (INDEPENDENT_AMBULATORY_CARE_PROVIDER_SITE_OTHER): Payer: No Typology Code available for payment source | Admitting: Gynecology

## 2011-10-07 DIAGNOSIS — R87610 Atypical squamous cells of undetermined significance on cytologic smear of cervix (ASC-US): Secondary | ICD-10-CM

## 2011-10-07 DIAGNOSIS — IMO0001 Reserved for inherently not codable concepts without codable children: Secondary | ICD-10-CM

## 2011-10-07 DIAGNOSIS — Z01419 Encounter for gynecological examination (general) (routine) without abnormal findings: Secondary | ICD-10-CM | POA: Insufficient documentation

## 2011-10-07 NOTE — Patient Instructions (Addendum)
Office will call you with Pap smear results. Assuming normal or low-grade changes and plan repeat in 6 months at your annual exam.

## 2011-10-07 NOTE — Progress Notes (Signed)
Patient has history of LEEP January 2012 showing high-grade dysplasia with clear margins. Her first follow up Pap smear July showed low-grade SIL. Colposcopy and biopsy showed low-grade SIL.  Follow up Pap smear January 2013 showed ASCUS with negative high-risk HPV. Patient here for follow up Pap smear. She is without complaints.  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 Cervix normal. Uterus normal size midline mobile nontender. Adnexa without masses or tenderness.  Assessment and plan: Low grade dysplasia, persistent. Pap done. Assuming low-grade are normal the and plan expected management with repeat Pap in 6 months at her annual exam. Otherwise triage based on results.

## 2011-10-15 ENCOUNTER — Encounter: Payer: Self-pay | Admitting: *Deleted

## 2011-10-15 NOTE — Progress Notes (Signed)
Patient ID: Katie Green, female   DOB: December 10, 1985, 26 y.o.   MRN: [id number] Pt informed of normal recent pap results.

## 2011-10-16 ENCOUNTER — Other Ambulatory Visit: Payer: Self-pay | Admitting: Family Medicine

## 2011-10-16 MED ORDER — HYDROCODONE-IBUPROFEN 7.5-200 MG PO TABS: 1.0000 | ORAL_TABLET | Freq: Four times a day (QID) | ORAL | Status: AC | PRN

## 2011-10-23 ENCOUNTER — Ambulatory Visit: Payer: No Typology Code available for payment source | Admitting: Sports Medicine

## 2011-11-03 ENCOUNTER — Ambulatory Visit (INDEPENDENT_AMBULATORY_CARE_PROVIDER_SITE_OTHER): Payer: No Typology Code available for payment source | Admitting: Sports Medicine

## 2011-11-03 ENCOUNTER — Encounter: Payer: Self-pay | Admitting: Sports Medicine

## 2011-11-03 VITALS — BP 110/60

## 2011-11-03 DIAGNOSIS — M25579 Pain in unspecified ankle and joints of unspecified foot: Secondary | ICD-10-CM

## 2011-11-03 NOTE — Progress Notes (Signed)
  Subjective:    Patient ID: Katie Green, female    DOB: 07-29-1985, 26 y.o.   MRN: [id number]  HPI patient comes in today for followup on her left ankle. She has been followed by Dr. Darrick Penna for this issue. Patient states she was unable to take the Elavil do to drowsiness. Still having pain across the dorsum of her foot, digitally with activity. It is felt to be related to a mild form of complex regional pain syndrome. She's had x-rays and MRI scans which were inconclusive. She was given a body helix ankle wrap which she found uncomfortable. Most comfortable thing she has found is an off the shelf ankle wrap with heat. She denies any swelling.    Review of Systems     Objective:   Physical Exam Well-developed, well-nourished. Left ankle shows full motion without effusion. No soft tissue swelling. No erythema. Some tenderness diffusely across the dorsum of the ankle but nothing focal. Good ligamentous stability. No tenderness over the sinus tarsi. She is neurovascular intact distally and walking without significant limp.       Assessment & Plan:  Chronic left ankle pain-question mild complex regional pain syndrome  I discussed the possibility of trying oral gabapentin, but we will try a topical combination gabapentin/anti-inflammatory medication. She will apply 3 times daily. Explained to her the importance of continued physical activity as a part of her treatment for CRPS. Followup with me in 3-4 weeks. If topical medication is ineffective, we will reconsider oral gabapentin.

## 2011-11-25 ENCOUNTER — Ambulatory Visit: Payer: No Typology Code available for payment source | Admitting: Family Medicine

## 2011-12-12 ENCOUNTER — Other Ambulatory Visit: Payer: Self-pay | Admitting: Internal Medicine

## 2011-12-23 ENCOUNTER — Ambulatory Visit: Payer: No Typology Code available for payment source | Admitting: Family Medicine

## 2011-12-26 ENCOUNTER — Other Ambulatory Visit: Payer: Self-pay | Admitting: Internal Medicine

## 2011-12-26 NOTE — Telephone Encounter (Signed)
Dr. Merla Riches is this refill ok?

## 2011-12-28 NOTE — Telephone Encounter (Signed)
[facility] database inquiry please

## 2011-12-29 ENCOUNTER — Other Ambulatory Visit: Payer: Self-pay | Admitting: Internal Medicine

## 2011-12-31 ENCOUNTER — Encounter: Payer: Self-pay | Admitting: Internal Medicine

## 2011-12-31 ENCOUNTER — Ambulatory Visit (INDEPENDENT_AMBULATORY_CARE_PROVIDER_SITE_OTHER): Payer: No Typology Code available for payment source | Admitting: Internal Medicine

## 2011-12-31 VITALS — BP 108/72 | HR 91 | Temp 98.8°F | Resp 16 | Ht 62.5 in | Wt 98.0 lb

## 2011-12-31 DIAGNOSIS — F329 Major depressive disorder, single episode, unspecified: Secondary | ICD-10-CM

## 2011-12-31 DIAGNOSIS — F341 Dysthymic disorder: Secondary | ICD-10-CM

## 2011-12-31 DIAGNOSIS — J069 Acute upper respiratory infection, unspecified: Secondary | ICD-10-CM

## 2011-12-31 DIAGNOSIS — Z23 Encounter for immunization: Secondary | ICD-10-CM

## 2011-12-31 DIAGNOSIS — M79672 Pain in left foot: Secondary | ICD-10-CM

## 2011-12-31 DIAGNOSIS — F419 Anxiety disorder, unspecified: Secondary | ICD-10-CM

## 2011-12-31 DIAGNOSIS — F411 Generalized anxiety disorder: Secondary | ICD-10-CM

## 2011-12-31 DIAGNOSIS — M79609 Pain in unspecified limb: Secondary | ICD-10-CM

## 2011-12-31 MED ORDER — CITALOPRAM HYDROBROMIDE 40 MG PO TABS: 40.0000 mg | ORAL_TABLET | Freq: Every day | ORAL | Status: DC

## 2011-12-31 MED ORDER — ALPRAZOLAM 0.5 MG PO TABS: 0.5000 mg | ORAL_TABLET | Freq: Three times a day (TID) | ORAL | Status: DC | PRN

## 2011-12-31 MED ORDER — HYDROCODONE-ACETAMINOPHEN 10-325 MG PO TABS: 1.0000 | ORAL_TABLET | Freq: Four times a day (QID) | ORAL | Status: DC | PRN

## 2011-12-31 NOTE — Progress Notes (Signed)
  Subjective:    Patient ID: Katie Green, female    DOB: Jun 25, 1985, 26 y.o.   MRN: [id number]  HPIProblem #1 chronic pain after foot injury foot pain/walks differently so back pain--often wakes in tears with back and foot Pain without any meds incapacitating 10/10 Pain 3/10 with meds-worse as day goes on C. Recent office visit with Dr. Margaretha Sheffield  Problem #2 depression with anxiety and insomnia Still getting a good response from alprazolam during the daytime plus Celexa Sleep is fair to good and sometimes interrupted by pain but rarely by anxiety New job as a much better situation but she is overloaded personally as usual  Problem #3 woke with sore throat today/slight cough/no fever/no running nose/no history of allergies in the fall    Review of Systems Noncontributory No other recent illnesses    Objective:   Physical Exam Vital signs stable TMs and nose clear Throat slightly red with no exudate/no a.c. Or PC nodes Chest clear to auscultation Back mildly tender over the lumbar area with straight spine/is wearing Lidoderm patch on low back Straight leg raise within normal limits past 90 bilaterally Deep tendon reflexes normal Foot not examined Mood stable Judgment sound       Assessment & Plan:  Problem #1 chronic foot pain with back pain likely secondary to altered gait/muscular in origin There is a role for her inactivity as possible etiology We discussed the eventual referral to a chronic pain treatment facility if being aggressive with a sports medicine approach over the next 6 months does not get her completely off narcotics. She will see Dr. Margaretha Sheffield again soon  Problem #2 depression with anxiety and insomnia stable Meds refilled  Problem #3 viral URI-OTC meds Meds ordered this encounter  Medications         . lidocaine (LIDODERM) 5 %    Sig: Place 1 patch onto the skin daily. NEED REFILLS   Remove & Discard patch within 12 hours or as directed by MD  .  oxyCODONE-acetaminophen (PERCOCET/ROXICET) 5-325 MG per tablet    Sig: Take 1 tablet by mouth at bedtime. NEED REFILLS  . valACYclovir (VALTREX) 1000 MG tablet    She has this/may call for refills  . ALPRAZolam (XANAX) 0.5 MG tablet    Sig: Take 1 tablet (0.5 mg total) by mouth 3 (three) times daily as needed.    Dispense:  90 tablet    Refill:  5  . citalopram (CELEXA) 40 MG tablet    Sig: Take 1 tablet (40 mg total) by mouth daily.    Dispense:  90 tablet    Refill:  3  . HYDROcodone-acetaminophen (NORCO) 10-325 MG per tablet    Sig: Take 1 tablet by mouth every 6 (six) hours as needed for pain.    Dispense:  120 tablet    Refill:  5   Followup 6 months

## 2012-01-07 ENCOUNTER — Telehealth: Payer: Self-pay

## 2012-01-07 NOTE — Telephone Encounter (Signed)
Pt needing a rx refill on her percacits? [phone]

## 2012-01-08 NOTE — Telephone Encounter (Signed)
Patient called today for Percocet, looks like you have changed to Hydrocodone. Please advise.

## 2012-01-09 MED ORDER — PREDNISONE 10 MG PO TABS: 10.0000 mg | ORAL_TABLET | Freq: Every day | ORAL | Status: DC

## 2012-01-09 NOTE — Telephone Encounter (Signed)
She has appt on Monday with Dr Margaretha Sheffield. She states the Hydrocodone not effective. She wants to try the Prednisone, so I have sent this in for her. She can not come in tomorrow because she has family trip planned to [facility].

## 2012-01-09 NOTE — Telephone Encounter (Signed)
Her foot pain that is chronic and now thought to be due to complex regional pain syndrome Consultants(Dr fields and Dr Margaretha Sheffield) have suggested stopping oxycod and beginning the trial of those things that might work to put this in remission Elavil made her too sleepy, so they switched to a topical neurontin If 40mg  a day hydrocodone plus the topical is not effective then the next step is prednisone 10mg  tid for 5days, bid for 5days and then qd for 5 days If it would be better, I can see her Sat 8-2 to go over this (her last appt was cut short by time constraints)

## 2012-01-09 NOTE — Telephone Encounter (Signed)
Left message for her to call me back so I can discuss with her.

## 2012-01-11 NOTE — Telephone Encounter (Signed)
Will you please do a database inquiry?

## 2012-01-12 ENCOUNTER — Ambulatory Visit: Payer: No Typology Code available for payment source | Admitting: Sports Medicine

## 2012-01-12 NOTE — Telephone Encounter (Signed)
Database is in your box

## 2012-01-14 ENCOUNTER — Telehealth: Payer: Self-pay

## 2012-01-14 NOTE — Telephone Encounter (Signed)
Pt is breaking out from the predniSONE (DELTASONE) 10 MG tablet Please call [phone]

## 2012-01-14 NOTE — Telephone Encounter (Signed)
She has facial redness, it feels hot. I advised her this is side effect from the Prednisone, but is not an allergy. She said she is having trouble with this dose, she is advised to only take bid, since she has side effects from the prednisone. Dr Theotis Barrio really wants her to try this medication, to see if it will help her foot and decrease her narcotic use.

## 2012-01-14 NOTE — Telephone Encounter (Signed)
Noted. Please call patient and get an update prior to close.

## 2012-01-14 NOTE — Telephone Encounter (Signed)
Called pt, LMOM to CB. 

## 2012-01-15 NOTE — Telephone Encounter (Signed)
Patient called just after 5 to speak to Amy but she was leaving. Patient would like to just speak to clinical staff about her status.

## 2012-01-15 NOTE — Telephone Encounter (Signed)
Called patient, I left at 5:30 , got message this am. Have left message with patient to see how she is feeling.

## 2012-01-16 ENCOUNTER — Ambulatory Visit (INDEPENDENT_AMBULATORY_CARE_PROVIDER_SITE_OTHER): Payer: No Typology Code available for payment source | Admitting: Sports Medicine

## 2012-01-16 VITALS — BP 130/70 | Ht 63.0 in | Wt 98.0 lb

## 2012-01-16 DIAGNOSIS — M79609 Pain in unspecified limb: Secondary | ICD-10-CM

## 2012-01-16 DIAGNOSIS — M79672 Pain in left foot: Secondary | ICD-10-CM

## 2012-01-16 MED ORDER — GABAPENTIN 100 MG PO CAPS: 100.0000 mg | ORAL_CAPSULE | Freq: Three times a day (TID) | ORAL | Status: DC

## 2012-01-16 NOTE — Telephone Encounter (Signed)
LMOM to CB. 

## 2012-01-16 NOTE — Patient Instructions (Addendum)
Thank you for coming in today. Take the gabapentin at night.  After 1-2 weeks add a lunch time dose.  After 3-4 weeks add the breakfast dose.  You can increase you night time dose to 3 pills at night if you want also.   Let us know how this goes.  Also try ... Capsaicin cream. Dont get it in your eyes.   Come back in 4-6 weeks or sooner if needed.

## 2012-01-16 NOTE — Progress Notes (Signed)
Katie Green is a 26 y.o. female who presents to [facility] today for followup left foot pain.  Chronic dorsal left foot pain following an inversion injury about a year ago.  She has had several MRIs and x-rays all not showing significant structural abnormality.  She is thought to have complex regional pain syndrome.   She has been tried on opiates, amitriptyline and a topical compounded cream containing ketoprofen, baclofen, cyclobenzaprine, gabapentin, and lidocaine.  This helped however she has to apply it frequently and it is inconvenient at work.  She feels well otherwise without any radiating pain weakness or numbness.  Her primary care provider recently started her on a trial of prednisone for this.  She has not had gabapentin or Cymbalta yet.  She is able to work and go about her activities of daily living however it is bothersome.  Activity in contact with her skin worsen her pain.    PMH reviewed. Anxiety History  Substance Use Topics  . Smoking status: Current Every Day Smoker -- 0.5 packs/day    Types: Cigarettes  . Smokeless tobacco: Never Used  . Alcohol Use: Yes     rare   ROS as above otherwise neg   Exam:  BP 130/70  Ht 5\' 3"  (1.6 m)  Wt 98 lb (44.453 kg)  BMI 17.36 kg/m2  LMP 12/10/2011 Gen: Well NAD MSK: Left foot. Normal appearing normal motion mildly tender over the dorsal aspect of her foot.  Skin is sensitive to touch.  We'll foot motion strength capillary refill and pulses.

## 2012-01-16 NOTE — Assessment & Plan Note (Signed)
Discussed options with patient. Diagnosis at this time appears to be slightly uncertain, however leaning to complex regional pain syndrome.   Plan: Gabapentin starting a low dose with titration. Followup in 6 weeks.  Additionally discussed capsaicin cream.   May also consider Cymbalta in the future

## 2012-01-19 ENCOUNTER — Telehealth: Payer: Self-pay | Admitting: Family Medicine

## 2012-01-19 NOTE — Telephone Encounter (Signed)
Left message on machine to call back  

## 2012-01-20 NOTE — Telephone Encounter (Signed)
Pt called and ask to speak with Amy. [phone]

## 2012-01-20 NOTE — Telephone Encounter (Signed)
Called patient again, about the facial flushing with the prednisone. Left detailed message for her to call back if she is still symptomatic.

## 2012-01-21 ENCOUNTER — Telehealth: Payer: Self-pay | Admitting: Family Medicine

## 2012-01-21 NOTE — Telephone Encounter (Signed)
Spoke with patient patient will just come back in to be evaluated because she just cant take the pain any more

## 2012-01-25 ENCOUNTER — Other Ambulatory Visit: Payer: Self-pay | Admitting: Internal Medicine

## 2012-02-04 ENCOUNTER — Telehealth: Payer: Self-pay

## 2012-02-04 NOTE — Telephone Encounter (Signed)
Pt needs pain meds to get her to the ninth.  Insurance will not cover the HYDROcodone-acetaminophen (NORCO) 10-325 MG per tablet It was filled on the 13th, only covered for 25 days.  [facility]      CBN: [id number]

## 2012-02-04 NOTE — Telephone Encounter (Signed)
Left message for patient to return call.

## 2012-02-04 NOTE — Telephone Encounter (Signed)
I am a little confused - she was given #120 on 10/3.  Please clarify for me.

## 2012-02-05 ENCOUNTER — Telehealth: Payer: Self-pay

## 2012-02-05 NOTE — Telephone Encounter (Signed)
Left message for patient to return.

## 2012-02-05 NOTE — Telephone Encounter (Signed)
PATIENT CALLED WANTING TO SPEAK TO AMY ABOUT NEEDING THE DR. TO VERIFY A PRESCRIPTION FILLED AT THE PHARMACY. THANK YOU! PLEASE CALL BACK AS SOON AS YOU CAN.

## 2012-02-05 NOTE — Telephone Encounter (Signed)
Gave pt info and she will call pharmacy back to see if they misquoted her the price and/or if the rest of Rx could be transferred to another pharmacy that wouldn't cost so much to pay OOP. Pt stated she will CB if she can't pay OOP and ask [place] to send request to Dr Merla Riches to see if he will Rx another lower strength or anything else that ins might cover for the next two days.

## 2012-02-05 NOTE — Telephone Encounter (Signed)
Advised pt of 2 options and she is not able to pay the $90 + it was going to cost to pay OOP for the rest. 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 If it takes a day or two to get a quantity override done it won't help her tonight and tomorrow. Pt asks if there is any other medication or different strength that we could call in for her for the two days?

## 2012-02-05 NOTE — Telephone Encounter (Signed)
Branded Norco should cost about $70 for #30, generic about $18 for #30.  She should find another pharmacy, or question hers.  Sounds like their charges are out of line.

## 2012-02-05 NOTE — Telephone Encounter (Signed)
Pt Katie Green and said that it will only cost her about $12 for #15 tablets and thought that we would have to call in another Rx for that. I advised pt that pharmacy should be able to fill up to #20 tablets for her off of the Rx they already have since they only gave her #100 of them. Pt thanked [place] and will contact them. She will Katie Green if they can not do that.

## 2012-02-05 NOTE — Telephone Encounter (Signed)
Pt returned call and wanted [place] to document that she was still having prescription issues.    [phone]

## 2012-02-05 NOTE — Telephone Encounter (Signed)
Pt called back and LM on nurse VM explaining that her insurance only covers #100 hydrocodone at a time so that is all she gets out of the #120 we Rx, which only lasts her 25 days. Her insurance will not cover her next RF until 02/07/12 and she stated that she can not go that long w/out some type of pain medication and that she is barely able to tolerate the pain w/the medication. Pt requests that we send in something for her to help her pain until the 9th. [facility] and verified that all of this information is correct.

## 2012-02-05 NOTE — Telephone Encounter (Signed)
1. She can pay out of pocket for the remaining 20 tabs 2. She can contact her insurance to see if we can get authorization for a higher quantity than they allow.

## 2012-02-06 NOTE — Telephone Encounter (Signed)
Pt LM on nurse VM stating that she tried to get the pharm to fill some of the remaining tablets of hydrocodone that was on her Rx and pay OOP for them and was told that we needed to call and give them permission. [facility] and gave them permission. See notes under last phone message. Notified pt on VM

## 2012-02-09 ENCOUNTER — Other Ambulatory Visit: Payer: Self-pay | Admitting: Radiology

## 2012-02-25 ENCOUNTER — Ambulatory Visit: Payer: No Typology Code available for payment source | Admitting: Sports Medicine

## 2012-03-04 ENCOUNTER — Ambulatory Visit (INDEPENDENT_AMBULATORY_CARE_PROVIDER_SITE_OTHER): Payer: No Typology Code available for payment source | Admitting: Family Medicine

## 2012-03-04 VITALS — BP 114/72 | HR 108 | Temp 98.4°F | Resp 14 | Ht 63.0 in | Wt 103.8 lb

## 2012-03-04 DIAGNOSIS — S37019A Minor contusion of unspecified kidney, initial encounter: Secondary | ICD-10-CM

## 2012-03-04 DIAGNOSIS — S37011A Minor contusion of right kidney, initial encounter: Secondary | ICD-10-CM

## 2012-03-04 DIAGNOSIS — R319 Hematuria, unspecified: Secondary | ICD-10-CM

## 2012-03-04 DIAGNOSIS — M549 Dorsalgia, unspecified: Secondary | ICD-10-CM

## 2012-03-04 LAB — POCT URINALYSIS DIPSTICK
Ketones, UA: NEGATIVE
Leukocytes, UA: NEGATIVE
pH, UA: 6.5

## 2012-03-04 LAB — POCT UA - MICROSCOPIC ONLY
Casts, Ur, LPF, POC: NEGATIVE
Crystals, Ur, HPF, POC: NEGATIVE
Mucus, UA: NEGATIVE
Yeast, UA: NEGATIVE

## 2012-03-04 MED ORDER — OXYCODONE-ACETAMINOPHEN 5-325 MG PO TABS: ORAL_TABLET | ORAL | Status: DC
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
## 2012-03-04 NOTE — Patient Instructions (Addendum)
Percocet for severe pain  Drink lots of fluids to keep lotion your kidneys out well  Renal ultrasound will be scheduled for you as soon as we can.

## 2012-03-05 ENCOUNTER — Ambulatory Visit
Admission: RE | Admit: 2012-03-05 | Discharge: 2012-03-05 | Disposition: A | Discharge status: Home / Self Care | Payer: No Typology Code available for payment source | Source: Ambulatory Visit | Attending: Family Medicine | Admitting: Family Medicine

## 2012-03-05 DIAGNOSIS — S37011A Minor contusion of right kidney, initial encounter: Secondary | ICD-10-CM

## 2012-03-05 DIAGNOSIS — R319 Hematuria, unspecified: Secondary | ICD-10-CM

## 2012-03-05 DIAGNOSIS — M549 Dorsalgia, unspecified: Secondary | ICD-10-CM

## 2012-03-09 ENCOUNTER — Telehealth: Payer: Self-pay

## 2012-03-09 DIAGNOSIS — R319 Hematuria, unspecified: Secondary | ICD-10-CM

## 2012-03-09 NOTE — Telephone Encounter (Signed)
Left message for her to call back, advised [place] normal she needs to return, AND call me

## 2012-03-09 NOTE — Telephone Encounter (Signed)
PT STATES SHE HAD AN ULTRA SOUND DONE AND WOULD LIKE TO KNOW RESULTS PLEASE CALL [phone]. STATES SHE IS STILL IN PAIN

## 2012-03-09 NOTE — Telephone Encounter (Signed)
PATIENT CALLING us BACK PLEASE CALL HER AT [phone]

## 2012-03-09 NOTE — Telephone Encounter (Signed)
Call patient. U/S is normal.  RTC if not improving.  To ER if pain has worsened.

## 2012-03-10 NOTE — Telephone Encounter (Signed)
Dr Alwyn Ren, pt called back and reports that her back pain is about the same. She has not seen any blood in her urine lately, but since [place] was normal, she is requesting that we go ahead and refer her to a specialist to have this checked as soon as possible. Your OV notes had indicated that you wanted to do this, do you want to refer her to a urologist?

## 2012-03-11 ENCOUNTER — Telehealth: Payer: Self-pay

## 2012-03-11 DIAGNOSIS — R319 Hematuria, unspecified: Secondary | ICD-10-CM

## 2012-03-11 NOTE — Telephone Encounter (Signed)
The patient called, very upset, at the way her case has been handled.  The patient stated that she had been seen on 03/04/12 and was sent for renal utlrasound.  She stated she was not called with results, she had to call to get results herself, then she has not heard anything regarding any referrals and she is still in pain.  The patient wants pain medication to treat this pain and she wants the necessary referrals.  Please call the patient at [phone].  The patient states she does not feel that her original issues have been addressed.

## 2012-03-11 NOTE — Telephone Encounter (Signed)
Can someone else please address this message (see phone message from 12/10 also)? I sent message to Dr Alwyn Ren yesterday, but he has not responded yet.

## 2012-03-13 NOTE — Telephone Encounter (Signed)
Per Dr. Frederik Pear note in telephone encounter, pt to be referred for urology consult.  Not sure if this order was put into epic, so I have done so.  Please let pt know to expect a phone call about scheduling this

## 2012-03-13 NOTE — Telephone Encounter (Signed)
Yes.  Refer on to urology.

## 2012-03-15 NOTE — Telephone Encounter (Signed)
Patient has been advised of referral to urology, I have left message, she does not answer the phone. I will be happy to call her back or speak to her if she calls back.

## 2012-03-15 NOTE — Telephone Encounter (Signed)
Called pt advised Urology referral pending

## 2012-03-17 ENCOUNTER — Telehealth: Payer: Self-pay | Admitting: Radiology

## 2012-03-17 NOTE — Telephone Encounter (Signed)
I would like patient rechecked before more is prescribed, either here or at urology.

## 2012-03-17 NOTE — Telephone Encounter (Signed)
I have spoken to patient again. She states she is still painful. I have given her the information to call [facility]. She is asking for a refill of Percocet, I advised her this is normally a one time only prescription, for severe pain. I advised her Dr Alwyn Ren is not in the office and this request must go to  Him only. Please advise.   She states she will call urology.

## 2012-03-18 NOTE — Telephone Encounter (Signed)
patient returned call and notified. She voiced understanding.

## 2012-03-18 NOTE — Telephone Encounter (Signed)
I have called her back to advise, have left message for her to call me back.

## 2012-03-19 ENCOUNTER — Other Ambulatory Visit: Payer: Self-pay | Admitting: Gynecology

## 2012-04-16 ENCOUNTER — Encounter: Payer: Self-pay | Admitting: Gynecology

## 2012-04-16 ENCOUNTER — Other Ambulatory Visit (HOSPITAL_COMMUNITY)
Admission: RE | Admit: 2012-04-16 | Discharge: 2012-04-16 | Disposition: A | Discharge status: Home / Self Care | Payer: No Typology Code available for payment source | Source: Ambulatory Visit | Attending: Gynecology | Admitting: Gynecology

## 2012-04-16 ENCOUNTER — Ambulatory Visit (INDEPENDENT_AMBULATORY_CARE_PROVIDER_SITE_OTHER): Payer: No Typology Code available for payment source | Admitting: Gynecology

## 2012-04-16 VITALS — BP 120/78 | Ht 63.0 in | Wt 98.0 lb

## 2012-04-16 DIAGNOSIS — Z113 Encounter for screening for infections with a predominantly sexual mode of transmission: Secondary | ICD-10-CM

## 2012-04-16 DIAGNOSIS — N879 Dysplasia of cervix uteri, unspecified: Secondary | ICD-10-CM

## 2012-04-16 DIAGNOSIS — Z01419 Encounter for gynecological examination (general) (routine) without abnormal findings: Secondary | ICD-10-CM

## 2012-04-16 DIAGNOSIS — Z13220 Encounter for screening for lipoid disorders: Secondary | ICD-10-CM

## 2012-04-16 DIAGNOSIS — Z23 Encounter for immunization: Secondary | ICD-10-CM

## 2012-04-16 DIAGNOSIS — Z131 Encounter for screening for diabetes mellitus: Secondary | ICD-10-CM

## 2012-04-16 LAB — CBC WITH DIFFERENTIAL/PLATELET
Basophils Absolute: 0 10*3/uL (ref 0.0–0.1)
Basophils Relative: 1 % (ref 0–1)
Eosinophils Absolute: 0.1 10*3/uL (ref 0.0–0.7)
Eosinophils Relative: 1 % (ref 0–5)
HCT: 40.2 % (ref 36.0–46.0)
MCH: 31.8 pg (ref 26.0–34.0)
MCHC: 33.8 g/dL (ref 30.0–36.0)
MCV: 93.9 fL (ref 78.0–100.0)
Monocytes Absolute: 0.4 10*3/uL (ref 0.1–1.0)
Neutro Abs: 5.4 10*3/uL (ref 1.7–7.7)
RDW: 13.4 % (ref 11.5–15.5)

## 2012-04-16 MED ORDER — NORETHINDRONE ACET-ETHINYL EST 1-20 MG-MCG PO TABS: 1.0000 | ORAL_TABLET | Freq: Every day | ORAL | Status: DC

## 2012-04-16 NOTE — Patient Instructions (Signed)
Office will contact you with Pap smear results and then we'll determine when follow up as needed. Otherwise call if you need anything this coming year.

## 2012-04-16 NOTE — Addendum Note (Signed)
Addended by: Dayna Barker on: 04/16/2012 12:44 PM   Modules accepted: Orders

## 2012-04-16 NOTE — Progress Notes (Addendum)
Katie Green [birth year] [id number]        27 y.o.  G0P0 for annual exam.  Several issues noted below.  Past medical history,surgical history, medications, allergies, family history and social history were all reviewed and documented in the EPIC chart. ROS:  Was performed and pertinent positives and negatives are included in the history.  Exam: Kim assistant Filed Vitals:   04/16/12 1149  BP: 120/78  Height: 5\' 3"  (1.6 m)  Weight: 98 lb (44.453 kg)   General appearance  Normal Skin grossly normal Head/Neck normal with no cervical or supraclavicular adenopathy thyroid normal Lungs  clear Cardiac RR, without RMG Abdominal  soft, nontender, without masses, organomegaly or hernia Breasts  examined lying and sitting without masses, retractions, discharge or axillary adenopathy. Pelvic  Ext/BUS/vagina  normal   Cervix  normal Pap  Uterus  anteverted, normal size, shape and contour, midline and mobile nontender   Adnexa  Without masses or tenderness    Anus and perineum  normal      Assessment/Plan:  27 y.o. G0P0 female for annual exam.   1. History of cervical dysplasia.  LEEP January 2012 showing high-grade dysplasia with clear margins. Her first follow up Pap smear July showed low-grade SIL. Colposcopy and biopsy showed low-grade SIL. Follow up Pap smear January 2013 showed ASCUS with negative high-risk HPV.  Pap July 2013 should benign changes.  Pap done today. Follow up for results. 2. Birth control. Patient on Junel doing well and wants to continue. Risks of stroke heart attack DVT reviewed and accepted. Refill x1 year. 3. STD screening. GC Chlamydia screen done at her request. No known exposure. Serum screening declined. 4. Breast health periods SBE monthly reviewed. 5. 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 Will call if she needs refills. 6. Stop smoking. I again encouraged her to stop smoking and reviewed strategies. The association with cervical dysplasia again  reviewed. 7. Health maintenance. Baseline CBC lipid profile glucose urinalysis ordered. Follow up 4 Pap smear results. We'll triage follow up based on these results.    Dara Lords MD, 12:21 PM 04/16/2012

## 2012-04-17 LAB — LIPID PANEL
LDL Cholesterol: 113 mg/dL — ABNORMAL HIGH (ref 0–99)
VLDL: 32 mg/dL (ref 0–40)

## 2012-04-17 LAB — URINALYSIS W MICROSCOPIC + REFLEX CULTURE
Bilirubin Urine: NEGATIVE
Casts: NONE SEEN
Crystals: NONE SEEN
Glucose, UA: NEGATIVE mg/dL
Leukocytes, UA: NEGATIVE
Specific Gravity, Urine: 1.016 (ref 1.005–1.030)
Squamous Epithelial / LPF: NONE SEEN
pH: 7 (ref 5.0–8.0)

## 2012-04-18 LAB — GC/CHLAMYDIA PROBE AMP: GC Probe RNA: NEGATIVE

## 2012-04-19 ENCOUNTER — Other Ambulatory Visit: Payer: Self-pay | Admitting: Gynecology

## 2012-04-19 DIAGNOSIS — E785 Hyperlipidemia, unspecified: Secondary | ICD-10-CM

## 2012-04-23 ENCOUNTER — Telehealth: Payer: Self-pay

## 2012-04-23 NOTE — Telephone Encounter (Signed)
Patient called for pap results. Informed pap normal/negative.

## 2012-05-07 ENCOUNTER — Other Ambulatory Visit: Payer: Self-pay | Admitting: Gynecology

## 2012-06-09 ENCOUNTER — Ambulatory Visit: Payer: No Typology Code available for payment source | Admitting: Internal Medicine

## 2012-06-16 ENCOUNTER — Ambulatory Visit: Payer: No Typology Code available for payment source | Admitting: Internal Medicine

## 2012-07-03 ENCOUNTER — Other Ambulatory Visit: Payer: Self-pay | Admitting: Internal Medicine

## 2012-07-04 NOTE — Telephone Encounter (Signed)
Needs f/u Ok 1 month Last seen 10/13 Meds ordered this encounter  Medications  . HYDROcodone-acetaminophen (NORCO) 10-325 MG per tablet    Sig: TAKE 1 TABLET BY MOUTH EVERY 6 HOURS AS NEEDED FOR PAIN    Dispense:  120 tablet    Refill:  0

## 2012-07-05 NOTE — Telephone Encounter (Signed)
i am not aware that she is on adderall RPD

## 2012-07-05 NOTE — Telephone Encounter (Signed)
Dr Merla Riches, have you done the refill of Adderall, or do you need to wait until you are in office tomorrow?

## 2012-07-05 NOTE — Telephone Encounter (Signed)
Called [facility] and they advised that they do not have the RF req for Adderall in their system and thinks it must have been put in in error and then taken out. I will refuse RF.

## 2012-07-09 ENCOUNTER — Telehealth: Payer: Self-pay

## 2012-07-09 NOTE — Telephone Encounter (Signed)
Called her, she should probably come in for this.. Left message, she is advised to call me back.

## 2012-07-09 NOTE — Telephone Encounter (Signed)
Spoke to patient, she has increased foot pain, swelling, getting worse since last week. States she is scheduled for April 23rd. Stepped wrong last night, now severe. States hydrocodone no relief, states she got fired from her job yesterday. She is tearful, upset.

## 2012-07-09 NOTE — Telephone Encounter (Signed)
PT STATES SHE HAVE HURT HER FOOT AND WOULD LIKE DR DOOLITTLE TO CALL HER IN SOME PERCOCET. PLEASE CALL [phone]   [facility] [address]

## 2012-07-10 ENCOUNTER — Telehealth: Payer: Self-pay

## 2012-07-10 NOTE — Telephone Encounter (Signed)
Implies new injury to old problem/needs evaluation , possible xray

## 2012-07-10 NOTE — Telephone Encounter (Signed)
PATIENT WANTS DOOLITTLE TO KNOW THAT ITS NOT A NEW INJURY TO AN OLD PRE-EXISTING ONE. SHE WANTS TO KNOW IF SHE NEEDS TO COME IN OR HAVE HER RX FILLED? CAN SOMEONE LET HER KNOW. THANK YOU!

## 2012-07-12 NOTE — Telephone Encounter (Signed)
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

## 2012-07-12 NOTE — Telephone Encounter (Signed)
Called her to advise. Left message for her to call me back.  

## 2012-07-13 NOTE — Telephone Encounter (Signed)
She called again , after our conversation, called back left message for her, unsure why she does not understand need for office visit. She is asked about referral to pain management. She hopefully will call back about this.

## 2012-07-13 NOTE — Telephone Encounter (Signed)
Spoke to her, again

## 2012-07-14 ENCOUNTER — Telehealth: Payer: Self-pay | Admitting: *Deleted

## 2012-07-14 DIAGNOSIS — G894 Chronic pain syndrome: Secondary | ICD-10-CM

## 2012-07-14 NOTE — Telephone Encounter (Signed)
Pt returned call and stated that she will go to pain management

## 2012-07-21 ENCOUNTER — Encounter: Payer: Self-pay | Admitting: Internal Medicine

## 2012-07-21 ENCOUNTER — Ambulatory Visit (INDEPENDENT_AMBULATORY_CARE_PROVIDER_SITE_OTHER): Payer: No Typology Code available for payment source | Admitting: Internal Medicine

## 2012-07-21 VITALS — BP 116/76 | HR 101 | Temp 98.4°F | Resp 18 | Ht 63.5 in | Wt 99.0 lb

## 2012-07-21 DIAGNOSIS — F411 Generalized anxiety disorder: Secondary | ICD-10-CM

## 2012-07-21 DIAGNOSIS — IMO0002 Reserved for concepts with insufficient information to code with codable children: Secondary | ICD-10-CM

## 2012-07-21 DIAGNOSIS — F329 Major depressive disorder, single episode, unspecified: Secondary | ICD-10-CM

## 2012-07-21 DIAGNOSIS — F17200 Nicotine dependence, unspecified, uncomplicated: Secondary | ICD-10-CM

## 2012-07-21 DIAGNOSIS — G589 Mononeuropathy, unspecified: Secondary | ICD-10-CM

## 2012-07-21 DIAGNOSIS — M79672 Pain in left foot: Secondary | ICD-10-CM

## 2012-07-21 DIAGNOSIS — F419 Anxiety disorder, unspecified: Secondary | ICD-10-CM

## 2012-07-21 DIAGNOSIS — F341 Dysthymic disorder: Secondary | ICD-10-CM

## 2012-07-21 MED ORDER — ALPRAZOLAM 0.5 MG PO TABS: 0.5000 mg | ORAL_TABLET | Freq: Three times a day (TID) | ORAL | Status: DC | PRN

## 2012-07-21 MED ORDER — CITALOPRAM HYDROBROMIDE 40 MG PO TABS: 40.0000 mg | ORAL_TABLET | Freq: Every day | ORAL | Status: DC

## 2012-07-21 MED ORDER — HYDROCODONE-ACETAMINOPHEN 10-325 MG PO TABS: 1.0000 | ORAL_TABLET | Freq: Four times a day (QID) | ORAL | Status: DC | PRN

## 2012-07-21 MED ORDER — GABAPENTIN 100 MG PO CAPS: ORAL_CAPSULE | ORAL | Status: DC

## 2012-07-21 MED ORDER — OXYCODONE-ACETAMINOPHEN 10-325 MG PO TABS: 1.0000 | ORAL_TABLET | Freq: Every evening | ORAL | Status: DC | PRN

## 2012-07-22 DIAGNOSIS — IMO0002 Reserved for concepts with insufficient information to code with codable children: Secondary | ICD-10-CM | POA: Insufficient documentation

## 2012-07-22 DIAGNOSIS — G5640 Causalgia of unspecified upper limb: Secondary | ICD-10-CM | POA: Insufficient documentation

## 2012-07-22 NOTE — Progress Notes (Signed)
F/u Patient Active Problem List  Diagnosis  . Cervical dysplasia--see gyn note  . HSV-2 (herpes simplex virus 2) infection--none recent  . Anxiety  . Foot pain, left-see sports med notes/rx/now advanced to neurontin 100tid for last 2 months and still no effect on chronic foot pain--their likely dx=Complex regional pain syndrome///also on HC qid with some relief but can't sleep in recent 8 weeks due to pain and burning at night  . Leg length inequality--see sp med note??  . 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 Depression, reactive--incr sxtoms since job loss 2 weeks ago  .    Stable very good new relationship No new health complaints  Exam BP 116/76  Pulse 101  Temp(Src) 98.4 F (36.9 C)  Resp 18  Ht 5' 3.5" (1.613 m)  Wt 99 lb (44.906 kg)  BMI 17.26 kg/m2 HEENT clear Mood fragile/tears at times Affect down NoSI/HI Thought content appropr  IMP Needs eval by Dr Katheran Awe for potential CRPS to find better pain relief Will incr neurontin  To 100/100/300hs in meantime and allow percoct at hs prn for next 2 weeks  Meds ordered this encounter  Medications  . ALPRAZolam (XANAX) 0.5 MG tablet    Sig: Take 1 tablet (0.5 mg total) by mouth 3 (three) times daily as needed.    Dispense:  90 tablet    Refill:  5  . citalopram (CELEXA) 40 MG tablet    Sig: Take 1 tablet (40 mg total) by mouth daily.    Dispense:  90 tablet    Refill:  3  . HYDROcodone-acetaminophen (NORCO) 10-325 MG per tablet    Sig: Take 1 tablet by mouth every 6 (six) hours as needed for pain.    Dispense:  120 tablet    Refill:  5  . gabapentin (NEURONTIN) 100 MG capsule    Sig: 1 am, 1 miday and 3 at hs    Dispense:  150 capsule    Refill:  5  . oxyCODONE-acetaminophen (PERCOCET) 10-325 MG per tablet    Sig: Take 1 tablet by mouth at bedtime as needed for pain.    Dispense:  14 tablet    Refill:  0

## 2012-07-28 ENCOUNTER — Other Ambulatory Visit: Payer: Self-pay | Admitting: Internal Medicine

## 2012-07-28 NOTE — Telephone Encounter (Signed)
Done at appt 4/23

## 2012-07-28 NOTE — Telephone Encounter (Signed)
Done at appt last week.

## 2012-07-29 ENCOUNTER — Other Ambulatory Visit: Payer: Self-pay | Admitting: Internal Medicine

## 2012-08-10 ENCOUNTER — Telehealth: Payer: Self-pay

## 2012-08-10 DIAGNOSIS — IMO0002 Reserved for concepts with insufficient information to code with codable children: Secondary | ICD-10-CM

## 2012-08-10 NOTE — Telephone Encounter (Signed)
Will you check on the referral for pain management?

## 2012-08-10 NOTE — Telephone Encounter (Signed)
PT STATES SHE WAS TO GO [facility], BUT DR DOOLITTLE WANTED TO SEND HER SOMEWHERE ELSE AND SHE HASN'T HEARD FROM ANYONE. WANTED TO KNOW IF SHE NEED TO GO TO HEGE. IS ALSO OUT OF HER PERCOCET AND WANTED TO GET SOME CALLED IN SINCE SHE ISN'T SURE WHEN APPT IS PLEASE CALL [phone]    [facility] [address]

## 2012-08-13 NOTE — Telephone Encounter (Signed)
Pt called today to follow up on a call made back on 05.13.14. I informed her that we are researching the referral for pain management. She then stated that she would like a call back at [phone]. Thanks!

## 2012-08-18 NOTE — Telephone Encounter (Signed)
ok 

## 2012-08-18 NOTE — Telephone Encounter (Signed)
Pt CB to check on status of referral to pain clinic and also request for RF of Percocet to cover her until her appt. I checked w/Susie in Referrals and she reported that the referral had been sent to Dr Vear Clock at [facility] on 07/22/12 and they were supposed to call pt to set up appt. I gave pt phone # to [facility] and advised her to call and see if they can go ahead and schedule her appt. Asked pt to CB to give [place] date of appt if she has success in setting up appt today.  Dr Merla Riches, do you want to RF pt's Percocet?

## 2012-08-20 MED ORDER — OXYCODONE-ACETAMINOPHEN 10-325 MG PO TABS: 1.0000 | ORAL_TABLET | Freq: Every evening | ORAL | Status: DC | PRN

## 2012-08-20 NOTE — Telephone Encounter (Signed)
Pt also reported that she tried to call [facility] to see if she can set up the appt since she has not heard from them yet, and they were closed. She will try to call them back again Tue after the holiday.  LMOM for pt that Rx is ready for p/up.  Dr Merla Riches, [place] Childes. Also I could not close this encounter because your note below was incomplete.

## 2012-08-20 NOTE — Telephone Encounter (Signed)
Ok I sent the Rx per Dr Merla Riches order.

## 2012-08-20 NOTE — Telephone Encounter (Signed)
Dr Florence Canner this Rx but did not print and sign it. Can anyone else do this w/his documented authorization?

## 2012-09-16 ENCOUNTER — Other Ambulatory Visit: Payer: Self-pay

## 2012-09-16 MED ORDER — NORETHINDRONE ACET-ETHINYL EST 1-20 MG-MCG PO TABS: ORAL_TABLET | ORAL | Status: DC

## 2012-09-26 ENCOUNTER — Encounter: Payer: Self-pay | Admitting: Internal Medicine

## 2012-09-27 ENCOUNTER — Telehealth: Payer: Self-pay

## 2012-09-27 NOTE — Telephone Encounter (Signed)
Pt LM again on Vm and stated that it was the original [facility] in [place] that had tried to reach pt, but Dr Merla Riches had decided to send her to Dr Vear Clock at [facility] instead, and this is the office that has not returned pt's calls. Checked w/Donna who will call and ask them to return pt's call to schedule appt.  Dr Merla Riches, please still advise on Percocet RF.

## 2012-09-27 NOTE — Telephone Encounter (Signed)
Pt called and LM on my VM stating that she has called the pain clinic 4-5 times a week and they have not returned her call to set up an appt. I checked w/Referrals and Lupita Leash reported that the pain clinic faxed [place] a notice stating they have not been able to reach the pt, but Lupita Leash stated that the [facility] location is the one that schedules appts for the [place] locations as well. LMOM for pt w/[place]'s phone # to call to see if she can get the appt set up, and asked for CB w/date of appt.  Dr Merla Riches, pt asked if she can have another RF of the Percocet since she has not had any luck getting into the pain clinic yet?

## 2012-09-28 MED ORDER — OXYCODONE-ACETAMINOPHEN 10-325 MG PO TABS: 1.0000 | ORAL_TABLET | Freq: Every evening | ORAL | Status: DC | PRN

## 2012-09-28 NOTE — Telephone Encounter (Signed)
Patient advised; Rx at front desk

## 2012-09-28 NOTE — Telephone Encounter (Signed)
Meds ordered this encounter  Medications  . oxyCODONE-acetaminophen (PERCOCET) 10-325 MG per tablet    Sig: Take 1 tablet by mouth at bedtime as needed for pain.    Dispense:  14 tablet    Refill:  0   Dr Vear Clock would be best

## 2012-09-30 ENCOUNTER — Other Ambulatory Visit: Payer: Self-pay

## 2012-09-30 DIAGNOSIS — G894 Chronic pain syndrome: Secondary | ICD-10-CM

## 2012-10-13 ENCOUNTER — Telehealth: Payer: Self-pay | Admitting: *Deleted

## 2012-10-13 NOTE — Telephone Encounter (Signed)
Pt asked did you want her to come back in for 6 month pap recheck? Annual in Jan 2014, she is scheduled on 10/27/12 for 6 month follow up pap. She couldn't remember if you told her to continue every 6 months. Please advise

## 2012-10-13 NOTE — Telephone Encounter (Signed)
Pap smear January 2015 should be fine one year from her last Pap smear which was normal.

## 2012-10-14 NOTE — Telephone Encounter (Signed)
Left message on pt voicemail, appt. Will be canceled for 10/27/12

## 2012-10-19 ENCOUNTER — Encounter: Payer: Self-pay | Admitting: Internal Medicine

## 2012-10-20 ENCOUNTER — Other Ambulatory Visit: Payer: Self-pay | Admitting: Internal Medicine

## 2012-10-21 MED ORDER — OXYCODONE-ACETAMINOPHEN 10-325 MG PO TABS: 1.0000 | ORAL_TABLET | Freq: Every evening | ORAL | Status: DC | PRN

## 2012-10-26 MED ORDER — OXYCODONE-ACETAMINOPHEN 10-325 MG PO TABS: 1.0000 | ORAL_TABLET | Freq: Every evening | ORAL | Status: DC | PRN

## 2012-10-27 ENCOUNTER — Ambulatory Visit: Payer: No Typology Code available for payment source | Admitting: Gynecology

## 2012-11-02 ENCOUNTER — Other Ambulatory Visit: Payer: Self-pay | Admitting: Radiology

## 2012-11-02 MED ORDER — CITALOPRAM HYDROBROMIDE 40 MG PO TABS: 40.0000 mg | ORAL_TABLET | Freq: Every day | ORAL | Status: DC

## 2012-11-02 NOTE — Telephone Encounter (Signed)
[facility]

## 2012-11-20 ENCOUNTER — Other Ambulatory Visit: Payer: Self-pay | Admitting: Internal Medicine

## 2012-11-22 ENCOUNTER — Encounter: Payer: Self-pay | Admitting: Internal Medicine

## 2012-11-22 NOTE — Telephone Encounter (Signed)
Pended please advise.  

## 2012-11-23 ENCOUNTER — Telehealth: Payer: Self-pay

## 2012-11-23 MED ORDER — OXYCODONE-ACETAMINOPHEN 10-325 MG PO TABS: 1.0000 | ORAL_TABLET | Freq: Every evening | ORAL | Status: DC | PRN

## 2012-11-23 NOTE — Telephone Encounter (Signed)
Please check on status of pain management referral.

## 2012-11-23 NOTE — Telephone Encounter (Signed)
Left message on machine Rx is ready for pick up.

## 2012-12-14 ENCOUNTER — Encounter: Payer: Self-pay | Admitting: Internal Medicine

## 2012-12-15 MED ORDER — OXYCODONE-ACETAMINOPHEN 10-325 MG PO TABS: 1.0000 | ORAL_TABLET | Freq: Three times a day (TID) | ORAL | Status: DC | PRN

## 2012-12-15 NOTE — Telephone Encounter (Signed)
Meds ordered this encounter  Medications  . oxyCODONE-acetaminophen (PERCOCET) 10-325 MG per tablet    Sig: Take 1 tablet by mouth every 8 (eight) hours as needed for pain.    Dispense:  60 tablet    Refill:  0

## 2012-12-16 ENCOUNTER — Telehealth: Payer: Self-pay | Admitting: Radiology

## 2012-12-16 NOTE — Telephone Encounter (Signed)
Katie Green will you check status of the pain management?

## 2012-12-17 NOTE — Telephone Encounter (Signed)
Dr. Merla Riches would like to know why we have not gotten her into pain management yet?  He said he would chang to a different place if needed.  Please send message back to Dr. Merla Riches.

## 2012-12-21 ENCOUNTER — Other Ambulatory Visit: Payer: Self-pay | Admitting: Physician Assistant

## 2013-01-04 ENCOUNTER — Telehealth: Payer: Self-pay | Admitting: Internal Medicine

## 2013-01-04 DIAGNOSIS — IMO0002 Reserved for concepts with insufficient information to code with codable children: Secondary | ICD-10-CM

## 2013-01-04 DIAGNOSIS — M79672 Pain in left foot: Secondary | ICD-10-CM

## 2013-01-04 MED ORDER — OXYCODONE-ACETAMINOPHEN 10-325 MG PO TABS: 1.0000 | ORAL_TABLET | Freq: Three times a day (TID) | ORAL | Status: DC | PRN

## 2013-01-04 NOTE — Telephone Encounter (Signed)
Meds ordered this encounter  Medications  . oxyCODONE-acetaminophen (PERCOCET) 10-325 MG per tablet    Sig: Take 1 tablet by mouth every 8 (eight) hours as needed for pain.    Dispense:  60 tablet    Refill:  0   Complex regional pain syndrome  Foot pain, left  ? Chronic pain eval set yet

## 2013-01-05 NOTE — Telephone Encounter (Signed)
Please advise on status of pain management

## 2013-01-10 ENCOUNTER — Encounter: Payer: Self-pay | Admitting: Internal Medicine

## 2013-01-11 NOTE — Telephone Encounter (Signed)
Will review at appt 10/22

## 2013-01-19 ENCOUNTER — Ambulatory Visit (INDEPENDENT_AMBULATORY_CARE_PROVIDER_SITE_OTHER): Payer: No Typology Code available for payment source | Admitting: Internal Medicine

## 2013-01-19 VITALS — BP 116/82 | HR 105 | Temp 99.5°F | Resp 16 | Ht 63.5 in | Wt 100.0 lb

## 2013-01-19 DIAGNOSIS — Z23 Encounter for immunization: Secondary | ICD-10-CM

## 2013-01-19 DIAGNOSIS — H6121 Impacted cerumen, right ear: Secondary | ICD-10-CM

## 2013-01-19 DIAGNOSIS — F411 Generalized anxiety disorder: Secondary | ICD-10-CM

## 2013-01-19 DIAGNOSIS — F341 Dysthymic disorder: Secondary | ICD-10-CM

## 2013-01-19 DIAGNOSIS — F329 Major depressive disorder, single episode, unspecified: Secondary | ICD-10-CM

## 2013-01-19 DIAGNOSIS — G589 Mononeuropathy, unspecified: Secondary | ICD-10-CM

## 2013-01-19 DIAGNOSIS — F419 Anxiety disorder, unspecified: Secondary | ICD-10-CM

## 2013-01-19 DIAGNOSIS — H6120 Impacted cerumen, unspecified ear: Secondary | ICD-10-CM

## 2013-01-19 DIAGNOSIS — IMO0002 Reserved for concepts with insufficient information to code with codable children: Secondary | ICD-10-CM

## 2013-01-19 DIAGNOSIS — M79672 Pain in left foot: Secondary | ICD-10-CM

## 2013-01-19 MED ORDER — HYDROCODONE-ACETAMINOPHEN 10-325 MG PO TABS: 1.0000 | ORAL_TABLET | Freq: Four times a day (QID) | ORAL | Status: DC | PRN

## 2013-01-19 MED ORDER — OXYCODONE-ACETAMINOPHEN 10-325 MG PO TABS: 1.0000 | ORAL_TABLET | Freq: Three times a day (TID) | ORAL | Status: DC | PRN

## 2013-01-19 MED ORDER — GABAPENTIN 300 MG PO CAPS: 300.0000 mg | ORAL_CAPSULE | Freq: Every day | ORAL | Status: DC

## 2013-01-19 NOTE — Progress Notes (Signed)
  Subjective:    Patient ID: Katie Green, female    DOB: February 06, 1986, 27 y.o.   MRN: [id number]  HPI Patient presents for follow up. Patient Active Problem List   Diagnosis Date Noted  . Complex regional pain syndrome 07/22/2012  . Depression, reactive 10/01/2011  . Nicotine dependence 07/02/2011  . Foot pain, left 07/01/2011  . 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 Cervical dysplasia   . HSV-2 (herpes simplex virus 2) infection   . Anxiety     Still looking for employment. Was offered job at lower pay and after 4 weeks, was very unhappy and resigned. 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   Has not been able to get an appointment with Dr. Vear Clock- has not heard from their office. Continues to need daily pain meds and 4x10mg  HC not adequate--using 2 x 10 oxy in addition  Incr of gabapentin to tid had no effect  Has low grade fever today and feels hot. Right ear with fullness, feeling of water for 2 days.  Grandfather and uncles with fatal brain aneurysm. She is requesting CT angiogram for screening.  anx stable on meds/depr also  Review of Systems     Objective:   Physical Exam BP 116/82  Pulse 105  Temp(Src) 99.5 F (37.5 C)  Resp 16  Ht 5' 3.5" (1.613 m)  Wt 100 lb (45.36 kg)  BMI 17.43 kg/m2 HEENT clear exc R canal full of wax No nodes or thyromeg Mood good /aff appr  Irrigation resolved cerumen    Assessment & Plan:  Cerumen impaction, right  Complex regional pain syndrome -Foot pain, left      Plan:needs CPM consult//  decr gaba to hs only  Cont others  Need for prophylactic vaccination and inoculation against influenza - Plan: Flu Vaccine QUAD 36+ mos IM  Anxiety  Depression, reactive   Meds ordered this encounter  Medications  . HYDROcodone-acetaminophen (NORCO) 10-325 MG per tablet    Sig: Take 1 tablet by mouth every 6 (six) hours as needed for pain.    Dispense:  120 tablet    Refill:  0  .  oxyCODONE-acetaminophen (PERCOCET) 10-325 MG per tablet    Sig: Take 1 tablet by mouth every 8 (eight) hours as needed for pain.    Dispense:  60 tablet    Refill:  0  . gabapentin (NEURONTIN) 300 MG capsule    Sig: Take 1 capsule (300 mg total) by mouth at bedtime. 1 am, 1 miday and 3 at hs    Dispense:  30 capsule    Refill:  5  Call if out of others//f/u 6 mos

## 2013-01-23 ENCOUNTER — Other Ambulatory Visit: Payer: Self-pay | Admitting: Internal Medicine

## 2013-01-25 ENCOUNTER — Other Ambulatory Visit: Payer: Self-pay | Admitting: Internal Medicine

## 2013-02-07 ENCOUNTER — Other Ambulatory Visit: Payer: Self-pay | Admitting: Family Medicine

## 2013-02-09 ENCOUNTER — Other Ambulatory Visit: Payer: Self-pay | Admitting: Internal Medicine

## 2013-02-09 ENCOUNTER — Encounter: Payer: Self-pay | Admitting: Internal Medicine

## 2013-02-09 ENCOUNTER — Other Ambulatory Visit: Payer: Self-pay | Admitting: Family Medicine

## 2013-02-09 NOTE — Telephone Encounter (Signed)
PCP is Merla Riches; will forward request to him.

## 2013-02-10 ENCOUNTER — Encounter: Payer: Self-pay | Admitting: Internal Medicine

## 2013-02-10 MED ORDER — OXYCODONE-ACETAMINOPHEN 10-325 MG PO TABS: 1.0000 | ORAL_TABLET | Freq: Three times a day (TID) | ORAL | Status: DC | PRN

## 2013-02-10 NOTE — Telephone Encounter (Signed)
Meds ordered this encounter  Medications  . oxyCODONE-acetaminophen (PERCOCET) 10-325 MG per tablet    Sig: Take 1 tablet by mouth every 8 (eight) hours as needed for pain.    Dispense:  60 tablet    Refill:  0

## 2013-02-10 NOTE — Telephone Encounter (Signed)
Lupita Leash will check on pain management referral, was sent to [facility].

## 2013-02-22 ENCOUNTER — Other Ambulatory Visit: Payer: Self-pay | Admitting: Internal Medicine

## 2013-02-22 ENCOUNTER — Other Ambulatory Visit: Payer: Self-pay | Admitting: Family Medicine

## 2013-02-22 DIAGNOSIS — IMO0002 Reserved for concepts with insufficient information to code with codable children: Secondary | ICD-10-CM

## 2013-02-23 MED ORDER — OXYCODONE-ACETAMINOPHEN 10-325 MG PO TABS: 1.0000 | ORAL_TABLET | Freq: Three times a day (TID) | ORAL | Status: DC | PRN

## 2013-02-23 MED ORDER — HYDROCODONE-ACETAMINOPHEN 10-325 MG PO TABS: 1.0000 | ORAL_TABLET | Freq: Four times a day (QID) | ORAL | Status: DC | PRN

## 2013-02-23 NOTE — Telephone Encounter (Signed)
Meds ordered this encounter  Medications  . oxyCODONE-acetaminophen (PERCOCET) 10-325 MG per tablet    Sig: Take 1 tablet by mouth every 8 (eight) hours as needed for pain.    Dispense:  60 tablet    Refill:  0  . HYDROcodone-acetaminophen (NORCO) 10-325 MG per tablet    Sig: Take 1 tablet by mouth every 6 (six) hours as needed.    Dispense:  120 tablet    Refill:  0    

## 2013-02-25 NOTE — Telephone Encounter (Signed)
Patient not known to me; Dr. Merla Riches is PCP; will forward to him.

## 2013-02-26 NOTE — Telephone Encounter (Signed)
Did both last week

## 2013-03-07 ENCOUNTER — Other Ambulatory Visit: Payer: Self-pay | Admitting: Internal Medicine

## 2013-03-08 ENCOUNTER — Other Ambulatory Visit: Payer: Self-pay

## 2013-03-15 ENCOUNTER — Telehealth: Payer: Self-pay | Admitting: Internal Medicine

## 2013-03-15 DIAGNOSIS — IMO0002 Reserved for concepts with insufficient information to code with codable children: Secondary | ICD-10-CM

## 2013-03-16 MED ORDER — OXYCODONE-ACETAMINOPHEN 10-325 MG PO TABS: 1.0000 | ORAL_TABLET | Freq: Three times a day (TID) | ORAL | Status: DC | PRN

## 2013-03-16 MED ORDER — HYDROCODONE-ACETAMINOPHEN 10-325 MG PO TABS: 1.0000 | ORAL_TABLET | Freq: Four times a day (QID) | ORAL | Status: DC | PRN

## 2013-03-16 NOTE — Telephone Encounter (Signed)
Meds ordered this encounter  Medications  . oxyCODONE-acetaminophen (PERCOCET) 10-325 MG per tablet    Sig: Take 1 tablet by mouth every 8 (eight) hours as needed for pain. For 03/23/13 or after    Dispense:  60 tablet    Refill:  0  . HYDROcodone-acetaminophen (NORCO) 10-325 MG per tablet    Sig: Take 1 tablet by mouth every 6 (six) hours as needed. For 03/23/13 or after    Dispense:  120 tablet    Refill:  0

## 2013-03-17 NOTE — Telephone Encounter (Signed)
Patient has returned Rx she wants to fill this now.

## 2013-03-18 ENCOUNTER — Other Ambulatory Visit: Payer: Self-pay

## 2013-03-18 DIAGNOSIS — IMO0002 Reserved for concepts with insufficient information to code with codable children: Secondary | ICD-10-CM

## 2013-03-18 NOTE — Telephone Encounter (Signed)
Patient calling to speak to someone to have her RX's written date fixed because her  insurance only fills 100 for 25 day supply- hydrocodone and for the Oxy - 20 day supply. Both fill dates are supposed to be written for the 20th of December not the 24th.  Please advise. Patient was on hold for someone clinical but no one had called her from our office today that Im aware of.   [phone]

## 2013-03-18 NOTE — Telephone Encounter (Signed)
Can change if verified that this is what is needed--call pharm if necessary---25 d supply unusual and I don't remember doing this before///has appt dr Mickie Bail '14

## 2013-03-19 NOTE — Telephone Encounter (Signed)
Pt came in to check on Rx. She states Amy took the original Rx and she needs another written Rx to be written for 25 day supply. Can you re-write this, pharmacy will not let me change because she gave the Rx back to Amy.

## 2013-03-20 NOTE — Telephone Encounter (Signed)
rx given to amy is in my box-- Call her pharmacy to see why the 25 d restriction I'll be in at 8am monday

## 2013-03-21 MED ORDER — OXYCODONE-ACETAMINOPHEN 10-325 MG PO TABS: 1.0000 | ORAL_TABLET | Freq: Three times a day (TID) | ORAL | Status: DC | PRN

## 2013-03-21 MED ORDER — HYDROCODONE-ACETAMINOPHEN 10-325 MG PO TABS: 1.0000 | ORAL_TABLET | Freq: Four times a day (QID) | ORAL | Status: DC | PRN

## 2013-03-21 NOTE — Telephone Encounter (Signed)
[facility]. Patient now using [facility], called them, and she received #60 oxycodone on 02/28/13 #100 of the Hydrocodone on 02/23/13. She indicates #100 is the maximum she can get with her insurance pended both, without a date restriction. She does state she is okay with Oxycodone but ran out of hydrocodone.

## 2013-03-21 NOTE — Telephone Encounter (Signed)
Meds ordered this encounter  Medications  . oxyCODONE-acetaminophen (PERCOCET) 10-325 MG per tablet    Sig: Take 1 tablet by mouth every 8 (eight) hours as needed for pain.    Dispense:  60 tablet    Refill:  0  . HYDROcodone-acetaminophen (NORCO) 10-325 MG per tablet    Sig: Take 1 tablet by mouth every 6 (six) hours as needed.    Dispense:  100 tablet    Refill:  0

## 2013-03-23 ENCOUNTER — Other Ambulatory Visit: Payer: Self-pay | Admitting: Gynecology

## 2013-04-11 ENCOUNTER — Other Ambulatory Visit: Payer: Self-pay | Admitting: Internal Medicine

## 2013-04-11 DIAGNOSIS — IMO0002 Reserved for concepts with insufficient information to code with codable children: Secondary | ICD-10-CM

## 2013-04-13 ENCOUNTER — Other Ambulatory Visit: Payer: Self-pay | Admitting: Internal Medicine

## 2013-04-13 DIAGNOSIS — IMO0002 Reserved for concepts with insufficient information to code with codable children: Secondary | ICD-10-CM

## 2013-04-14 MED ORDER — HYDROCODONE-ACETAMINOPHEN 10-325 MG PO TABS: 1.0000 | ORAL_TABLET | Freq: Four times a day (QID) | ORAL | Status: DC | PRN

## 2013-04-14 MED ORDER — OXYCODONE-ACETAMINOPHEN 10-325 MG PO TABS: 1.0000 | ORAL_TABLET | Freq: Three times a day (TID) | ORAL | Status: DC | PRN

## 2013-04-14 NOTE — Telephone Encounter (Signed)
I have contacted patient through my chart.  It appears that the Rx is a little earlier than 1 month but I have filled in his absence.  Please put the Rx up front for the patient to pick up.

## 2013-04-20 ENCOUNTER — Encounter: Payer: No Typology Code available for payment source | Admitting: Gynecology

## 2013-04-26 ENCOUNTER — Encounter: Payer: Self-pay | Admitting: Gynecology

## 2013-04-26 ENCOUNTER — Other Ambulatory Visit (HOSPITAL_COMMUNITY)
Admission: RE | Admit: 2013-04-26 | Payer: No Typology Code available for payment source | Source: Ambulatory Visit | Admitting: Gynecology

## 2013-04-26 ENCOUNTER — Ambulatory Visit (INDEPENDENT_AMBULATORY_CARE_PROVIDER_SITE_OTHER): Payer: No Typology Code available for payment source | Admitting: Gynecology

## 2013-04-26 VITALS — BP 116/70 | Ht 63.0 in | Wt 102.0 lb

## 2013-04-26 DIAGNOSIS — Z01419 Encounter for gynecological examination (general) (routine) without abnormal findings: Secondary | ICD-10-CM | POA: Insufficient documentation

## 2013-04-26 DIAGNOSIS — Z113 Encounter for screening for infections with a predominantly sexual mode of transmission: Secondary | ICD-10-CM

## 2013-04-26 DIAGNOSIS — Z1151 Encounter for screening for human papillomavirus (HPV): Secondary | ICD-10-CM | POA: Insufficient documentation

## 2013-04-26 DIAGNOSIS — Z309 Encounter for contraceptive management, unspecified: Secondary | ICD-10-CM

## 2013-04-26 LAB — CBC WITH DIFFERENTIAL/PLATELET
Basophils Absolute: 0 10*3/uL (ref 0.0–0.1)
Basophils Relative: 1 % (ref 0–1)
Eosinophils Absolute: 0.1 10*3/uL (ref 0.0–0.7)
Eosinophils Relative: 1 % (ref 0–5)
HCT: 38.6 % (ref 36.0–46.0)
Hemoglobin: 13 g/dL (ref 12.0–15.0)
LYMPHS ABS: 3 10*3/uL (ref 0.7–4.0)
LYMPHS PCT: 46 % (ref 12–46)
MCH: 32.7 pg (ref 26.0–34.0)
MCHC: 33.7 g/dL (ref 30.0–36.0)
MCV: 97.2 fL (ref 78.0–100.0)
MONOS PCT: 9 % (ref 3–12)
Monocytes Absolute: 0.6 10*3/uL (ref 0.1–1.0)
NEUTROS ABS: 2.9 10*3/uL (ref 1.7–7.7)
NEUTROS PCT: 43 % (ref 43–77)
PLATELETS: 375 10*3/uL (ref 150–400)
RBC: 3.97 MIL/uL (ref 3.87–5.11)
RDW: 13.5 % (ref 11.5–15.5)
WBC: 6.5 10*3/uL (ref 4.0–10.5)

## 2013-04-26 MED ORDER — NORETHINDRONE ACET-ETHINYL EST 1-20 MG-MCG PO TABS: ORAL_TABLET | ORAL | Status: DC

## 2013-04-26 NOTE — Progress Notes (Signed)
Katie MainsMelissa A Green 23-Apr-1985 [id number]        28 y.o.  G0P0 for annual exam.  Several issues noted below.  Past medical history,surgical history, problem list, medications, allergies, family history and social history were all reviewed and documented in the EPIC chart.  ROS:  Performed and pertinent positives and negatives are included in the history, assessment and plan .  Exam: Kim assistant Filed Vitals:   04/26/13 1450  BP: 116/70  Height: 5\' 3"  (1.6 m)  Weight: 102 lb (46.267 kg)   General appearance  Normal Skin grossly normal Head/Neck normal with no cervical or supraclavicular adenopathy thyroid normal Lungs  clear Cardiac RR, without RMG Abdominal  soft, nontender, without masses, organomegaly or hernia Breasts  examined lying and sitting without masses, retractions, discharge or axillary adenopathy. Pelvic  Ext/BUS/vagina  Normal  Cervix  Normal. Pap/HPV, GC/Chlamydia done  Uterus  anteverted, normal size, shape and contour, midline and mobile nontender   Adnexa  Without masses or tenderness    Anus and perineum  Normal   Rectovaginal  Normal sphincter tone without palpated masses or tenderness.    Assessment/Plan:  28 y.o. G0P0 female for annual exam regular menses, oral contraceptives..   #1  Pap smear 03/2012. Pap/HPV today.  History of: January 2012 LEEP  showing high-grade dysplasia with clear margins.  July 2012  Pap smear showed low-grade SIL. Colposcopy and biopsy showed low-grade SIL.  January 2013  Pap smear January 2013 showed ASCUS with negative high-risk HPV.   July 2013  Pap smear showed benign reactive changes. January 2014 Pap smear normal #2 Contraceptive management. Patient on Loestrin 120 equivalent and wants to continue. Doing well without complaints. Refill x1 year. #3 Breast health. SBE monthly reviewed. #4 STD screening. Patient requests GC/chlamydia screening. No known exposure but wants to be screened. Condoms to help decrease STD risks  despite being on birth control pills. #5 HSV 2. Was having increased outbreaks and started Valtrex 500 mg daily per Dr. Merla Richesoolittle. Will continue to do so now as having good results with no outbreaks. #6 Stop smoking strategies reviewed and encouraged. #7 Health maintenance.  Baseline CBC comprehensive metabolic panel lipid profile urinalysis ordered. Followup one year, sooner as needed.      Note: This document was prepared with digital dictation and possible smart phrase technology. Any transcriptional errors that result from this process are unintentional.   Dara LordsFONTAINE,Owen Pratte P MD, 3:29 PM 04/26/2013

## 2013-04-26 NOTE — Addendum Note (Signed)
Addended by: Dayna BarkerGARDNER, KIMBERLY K on: 04/26/2013 03:43 PM   Modules accepted: Orders

## 2013-04-26 NOTE — Patient Instructions (Signed)
Followup in one year for annual exam, sooner as needed. 

## 2013-04-27 ENCOUNTER — Other Ambulatory Visit: Payer: Self-pay | Admitting: Gynecology

## 2013-04-27 DIAGNOSIS — R3129 Other microscopic hematuria: Secondary | ICD-10-CM

## 2013-04-27 LAB — COMPREHENSIVE METABOLIC PANEL
ALT: 18 U/L (ref 0–35)
AST: 19 U/L (ref 0–37)
Albumin: 4.4 g/dL (ref 3.5–5.2)
Alkaline Phosphatase: 32 U/L — ABNORMAL LOW (ref 39–117)
BILIRUBIN TOTAL: 0.2 mg/dL — AB (ref 0.3–1.2)
BUN: 8 mg/dL (ref 6–23)
CHLORIDE: 101 meq/L (ref 96–112)
CO2: 25 meq/L (ref 19–32)
Calcium: 9.2 mg/dL (ref 8.4–10.5)
Creat: 0.68 mg/dL (ref 0.50–1.10)
Glucose, Bld: 89 mg/dL (ref 70–99)
POTASSIUM: 4.2 meq/L (ref 3.5–5.3)
SODIUM: 136 meq/L (ref 135–145)
TOTAL PROTEIN: 6.6 g/dL (ref 6.0–8.3)

## 2013-04-27 LAB — URINALYSIS W MICROSCOPIC + REFLEX CULTURE
BILIRUBIN URINE: NEGATIVE
Bacteria, UA: NONE SEEN
CASTS: NONE SEEN
CRYSTALS: NONE SEEN
Glucose, UA: NEGATIVE mg/dL
KETONES UR: NEGATIVE mg/dL
Leukocytes, UA: NEGATIVE
NITRITE: NEGATIVE
PH: 6.5 (ref 5.0–8.0)
Protein, ur: NEGATIVE mg/dL
RBC / HPF: >50 RBC/hpf — AB (ref <3)
SQUAMOUS EPITHELIAL / LPF: NONE SEEN
Specific Gravity, Urine: 1.024 (ref 1.005–1.030)
UROBILINOGEN UA: 0.2 mg/dL (ref 0.0–1.0)

## 2013-04-27 LAB — LIPID PANEL
Cholesterol: 143 mg/dL (ref 0–200)
HDL: 46 mg/dL (ref >39)
LDL Cholesterol: 72 mg/dL (ref 0–99)
Total CHOL/HDL Ratio: 3.1 Ratio
Triglycerides: 125 mg/dL (ref <150)
VLDL: 25 mg/dL (ref 0–40)

## 2013-04-27 LAB — GC/CHLAMYDIA PROBE AMP
CT Probe RNA: NEGATIVE
GC Probe RNA: NEGATIVE

## 2013-04-28 LAB — URINE CULTURE: Colony Count: 3000

## 2013-05-03 ENCOUNTER — Encounter: Payer: Self-pay | Admitting: Internal Medicine

## 2013-05-03 ENCOUNTER — Other Ambulatory Visit: Payer: Self-pay | Admitting: Physician Assistant

## 2013-05-03 DIAGNOSIS — IMO0002 Reserved for concepts with insufficient information to code with codable children: Secondary | ICD-10-CM

## 2013-05-04 ENCOUNTER — Encounter: Payer: Self-pay | Admitting: Internal Medicine

## 2013-05-04 ENCOUNTER — Encounter: Payer: No Typology Code available for payment source | Admitting: Internal Medicine

## 2013-05-04 MED ORDER — HYDROCODONE-ACETAMINOPHEN 10-325 MG PO TABS: 1.0000 | ORAL_TABLET | Freq: Four times a day (QID) | ORAL | Status: DC | PRN

## 2013-05-04 MED ORDER — OXYCODONE-ACETAMINOPHEN 10-325 MG PO TABS: 1.0000 | ORAL_TABLET | Freq: Three times a day (TID) | ORAL | Status: DC | PRN

## 2013-05-04 NOTE — Telephone Encounter (Signed)
Meds ordered this encounter  Medications  . oxyCODONE-acetaminophen (PERCOCET) 10-325 MG per tablet    Sig: Take 1 tablet by mouth every 8 (eight) hours as needed for pain.    Dispense:  60 tablet    Refill:  0  . HYDROcodone-acetaminophen (NORCO) 10-325 MG per tablet    Sig: Take 1 tablet by mouth every 6 (six) hours as needed.    Dispense:  120 tablet    Refill:  0

## 2013-05-09 NOTE — Telephone Encounter (Signed)
Pt picked up.

## 2013-05-11 ENCOUNTER — Other Ambulatory Visit: Payer: No Typology Code available for payment source

## 2013-05-11 DIAGNOSIS — R3129 Other microscopic hematuria: Secondary | ICD-10-CM

## 2013-05-12 ENCOUNTER — Encounter: Payer: Self-pay | Admitting: Gynecology

## 2013-05-12 LAB — URINALYSIS W MICROSCOPIC + REFLEX CULTURE
Bacteria, UA: NONE SEEN
Bilirubin Urine: NEGATIVE
CRYSTALS: NONE SEEN
Casts: NONE SEEN
Glucose, UA: NEGATIVE mg/dL
Hgb urine dipstick: NEGATIVE
Ketones, ur: NEGATIVE mg/dL
LEUKOCYTES UA: NEGATIVE
NITRITE: NEGATIVE
PH: 6.5 (ref 5.0–8.0)
Protein, ur: 30 mg/dL — AB
SPECIFIC GRAVITY, URINE: 1.023 (ref 1.005–1.030)
SQUAMOUS EPITHELIAL / LPF: NONE SEEN
Urobilinogen, UA: 0.2 mg/dL (ref 0.0–1.0)

## 2013-05-23 ENCOUNTER — Other Ambulatory Visit: Payer: Self-pay | Admitting: Internal Medicine

## 2013-05-23 DIAGNOSIS — IMO0002 Reserved for concepts with insufficient information to code with codable children: Secondary | ICD-10-CM

## 2013-05-24 MED ORDER — OXYCODONE-ACETAMINOPHEN 10-325 MG PO TABS: 1.0000 | ORAL_TABLET | Freq: Three times a day (TID) | ORAL | Status: DC | PRN

## 2013-05-24 MED ORDER — HYDROCODONE-ACETAMINOPHEN 10-325 MG PO TABS: 1.0000 | ORAL_TABLET | Freq: Four times a day (QID) | ORAL | Status: DC | PRN

## 2013-05-25 NOTE — Telephone Encounter (Signed)
Notified pt on VM Rx is ready. 

## 2013-06-13 ENCOUNTER — Other Ambulatory Visit: Payer: Self-pay | Admitting: Pain Medicine

## 2013-06-13 DIAGNOSIS — M545 Low back pain, unspecified: Secondary | ICD-10-CM

## 2013-06-16 ENCOUNTER — Other Ambulatory Visit: Payer: No Typology Code available for payment source

## 2013-06-20 ENCOUNTER — Ambulatory Visit
Admission: RE | Admit: 2013-06-20 | Discharge: 2013-06-20 | Disposition: A | Discharge status: Home / Self Care | Payer: No Typology Code available for payment source | Source: Ambulatory Visit | Attending: Pain Medicine | Admitting: Pain Medicine

## 2013-06-20 DIAGNOSIS — M545 Low back pain, unspecified: Secondary | ICD-10-CM

## 2013-06-22 ENCOUNTER — Ambulatory Visit: Payer: Self-pay | Admitting: Internal Medicine

## 2013-07-01 ENCOUNTER — Other Ambulatory Visit: Payer: Self-pay | Admitting: Internal Medicine

## 2013-07-01 DIAGNOSIS — IMO0002 Reserved for concepts with insufficient information to code with codable children: Secondary | ICD-10-CM

## 2013-07-01 NOTE — Telephone Encounter (Signed)
Patient stated Dr. Merla Richesoolittle referred her to a main management facility. Patient called facility on Monday to request refill on Percocet 1 tablet by mouth daily. She stated no one has contacted her about refill request. She is requesting for us to refill the medication. Best contact number [phone].

## 2013-07-04 NOTE — Telephone Encounter (Signed)
Call pat--we need to talk to dr Jordan LikesSpivey because if they are starting her treatment, it would be a violation of the contract with them if we were to fill her meds

## 2013-07-05 NOTE — Telephone Encounter (Signed)
Left message on machine to call back  

## 2013-07-06 NOTE — Telephone Encounter (Signed)
If we fill this it will violate pain contract---call spivey to get permission --let her know of this problem

## 2013-07-07 NOTE — Telephone Encounter (Signed)
LMOM again for pt to CB. 

## 2013-07-11 NOTE — Telephone Encounter (Signed)
Whatever happened here??

## 2013-07-11 NOTE — Telephone Encounter (Signed)
LMOM that we have been trying to reach her re: pain med request, and that I am assuming bc she has not been returning our calls that she has already gotten this taken care of by pain mgmt clinic. Asked for CB if meds still needed. Dr Merla Richesoolittle, I'm closing this enc and wanted you to be aware of status.

## 2013-07-11 NOTE — Telephone Encounter (Signed)
Thanks-disregard my message 2 minutes ago

## 2013-07-28 ENCOUNTER — Other Ambulatory Visit: Payer: Self-pay | Admitting: Internal Medicine

## 2013-07-28 NOTE — Telephone Encounter (Signed)
Pt CB and stated she should have plenty until after appt so will wait until OV. Sent note w/denial to exp scripts

## 2013-07-28 NOTE — Telephone Encounter (Signed)
LMOM for pt to CB to let me know if she has enough tablets to last until OV next week, or if she needs RF sent in now, in which case I will send for approval of a 90 day supply.

## 2013-08-03 ENCOUNTER — Ambulatory Visit (INDEPENDENT_AMBULATORY_CARE_PROVIDER_SITE_OTHER): Payer: No Typology Code available for payment source | Admitting: Internal Medicine

## 2013-08-03 ENCOUNTER — Encounter: Payer: Self-pay | Admitting: Internal Medicine

## 2013-08-03 VITALS — BP 110/60 | HR 106 | Temp 98.7°F | Resp 16 | Ht 63.0 in | Wt 100.6 lb

## 2013-08-03 DIAGNOSIS — F329 Major depressive disorder, single episode, unspecified: Secondary | ICD-10-CM

## 2013-08-03 DIAGNOSIS — F341 Dysthymic disorder: Secondary | ICD-10-CM

## 2013-08-03 MED ORDER — CITALOPRAM HYDROBROMIDE 40 MG PO TABS: 40.0000 mg | ORAL_TABLET | Freq: Every day | ORAL | Status: DC

## 2013-08-03 MED ORDER — ALPRAZOLAM 0.5 MG PO TABS: ORAL_TABLET | ORAL | Status: DC

## 2013-08-03 NOTE — Progress Notes (Signed)
   Subjective:    Patient ID: Katie Green, female    DOB: 04/30/1985, 28 y.o.   MRN: [id number] This chart was scribed for Katie P. Merla Richesoolittle, MD by Marica OtterNusrat Rahman, ED Scribe. This patient was seen in room 25 and the patient's care was started at 2:45 PM.   Chief Complaint  Patient presents with  . Anxiety    HPI HPI Comments: Katie Green is a 28 y.o. female who presents to the Emergency Department complaining of depression and associated anxiety. Pt reports she is currently looking for a job and reports that she has had a rough six months. Pt reports that she has been on several interviews and is waiting for an answer. Pt states that she is very anxious and depressed because of her lack of income and her recent breakup end of a romantic relationship. Pt reports her grandfather has been supporting her financially. Pt reports that at times it is difficult for her to leave the house due to her depression and anxiety. Pt also complains of associated difficulty falling asleep.   Pt also requests medication refills.   Pt reports her chronic right foot pain has responded to rx at [facility]-see scanned notes   Pt is a current smoker who smokes .5 ppd. Pt is also an alcohol user. Not ready to change  Review of Systems  Constitutional:       PT is upset and teary as she speaks about her recent breakup and unemployment.   Psychiatric/Behavioral: Positive for sleep disturbance (difficulty falling asleep).       PT is upset and teary as she speaks about her recent breakup and unemployment.    Objective:   Physical Exam  Nursing note and vitals reviewed. Constitutional: She is oriented to person, place, and time. She appears well-developed and well-nourished. No distress.  HENT:  Head: Normocephalic and atraumatic.  Eyes: EOM are normal.  Neck: Neck supple.  Cardiovascular: Normal rate.   Pulmonary/Chest: Effort normal. No respiratory distress.  Musculoskeletal: Normal range of motion.    Neurological: She is alert and oriented to person, place, and time.  Skin: Skin is warm and dry.  Psychiatric:  Pt is teary and sad  she has good sense of future/no SI BP 110/60  Pulse 106  Temp(Src) 98.7 F (37.1 C) (Oral)  Resp 16  Ht 5\' 3"  (1.6 m)  Wt 100 lb 9.6 oz (45.632 kg)  BMI 17.83 kg/m2  SpO2 99%  LMP 07/11/2013   Assessment & Plan:    I have completed the patient encounter in its entirety as documented by the scribe, with editing by me where necessary. Katie Green, M.D.  Depression, reactive with anxiety CRPS--cont PM  Meds ordered this encounter  Medications  . citalopram (CELEXA) 40 MG tablet    Sig: Take 1 tablet (40 mg total) by mouth daily.    Dispense:  90 tablet    Refill:  3  . ALPRAZolam (XANAX) 0.5 MG tablet    Sig: TAKE 1 TABLET 3 TIMES A DAY AS NEEDED    Dispense:  90 tablet    Refill:  5   Will follow closely No $ for couns//not good at opening up F/u 6 mos

## 2014-02-01 ENCOUNTER — Ambulatory Visit (INDEPENDENT_AMBULATORY_CARE_PROVIDER_SITE_OTHER): Payer: No Typology Code available for payment source | Admitting: Internal Medicine

## 2014-02-01 ENCOUNTER — Encounter: Payer: Self-pay | Admitting: Internal Medicine

## 2014-02-01 VITALS — BP 110/74 | HR 100 | Temp 98.6°F | Resp 16 | Ht 66.0 in | Wt 96.0 lb

## 2014-02-01 DIAGNOSIS — Z23 Encounter for immunization: Secondary | ICD-10-CM

## 2014-02-01 DIAGNOSIS — F341 Dysthymic disorder: Secondary | ICD-10-CM

## 2014-02-01 DIAGNOSIS — F329 Major depressive disorder, single episode, unspecified: Secondary | ICD-10-CM

## 2014-02-01 DIAGNOSIS — F419 Anxiety disorder, unspecified: Secondary | ICD-10-CM

## 2014-02-01 MED ORDER — ALPRAZOLAM 1 MG PO TABS
ORAL_TABLET | ORAL | Status: DC
Start: 2014-02-01 — End: 2014-08-02

## 2014-02-01 NOTE — Progress Notes (Signed)
Subjective:    Patient ID: Katie MainsMelissa A Green, female    DOB: 05-10-1985, 28 y.o.   MRN: [id number]  Tonye PearsonOLITTLE, ROBERT P, MD  Chief Complaint  Patient presents with  . Medication Refill   Medications, allergies, past medical history, surgical history, family history, social history and problem list reviewed and updated.  HPI  [age] yof with pmh reactive depression and anxiety presents for 6 month f/u and med refill.   She has been doing ok since we lat saw her in May 2015. 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 She is now in the process of interviewing for another assistant job. She is volunteering at a local preschool.   She has had a recent breakup of a new relationship which she thought was "the one". Very difficult to be alone again. This followers imilar pattern of the pastrelationships.  She feels her anxiety is worse since we last saw her. She is having a hard time dealing with all the stress in her life (jobs, finances, relationship issues). She is still taking the celexa 40 mg qd. She has increased the xanax to three times a day, this is up from 1-2 times a day a few months ago.   She is not interested in counseling as she feels it is not beneficial for her and she would rather just be seen here.   Insomnia - Moreso trouble falling asleep than waking up during sleep. She mentions today that for the past few months she has had episodes where she stays up for 3-4 straight days at a time. She thinks she does this as she enjoys being up at night because she feels like the rest of the world is asleep at this time. She doesn't feel as if she is full of energy during these episodes, she just chooses to stay up rather than sleep. Does not do anything that she later regrets. When she eventually sleeps she'll be out for an entire day. She doesn't feel like this affects her day to day life.   Complex  regional pain syndrome - Sees pain management. Just saw them 11/2103. They manage her medications for this. She feels that the pain has slowly improved recently and is no longer taking the gabapentin.   Review of Systems No CP, SOB, fever, chills.     Objective:   Physical Exam  Constitutional: She is oriented to person, place, and time. She appears well-developed and well-nourished.  BP 110/74 mmHg  Pulse 100  Temp(Src) 98.6 F (37 C)  Resp 16  Ht 5\' 6"  (1.676 m)  Wt 96 lb (43.545 kg)  BMI 15.50 kg/m2  SpO2 99%   Neurological: She is alert and oriented to person, place, and time.  Psychiatric: She has a normal mood and affect. Her speech is normal and behavior is normal. Judgment and thought content normal. Cognition and memory are normal.  Somewhat teary and sad briefly while discussing recent job loss. Otherwise normal mood and affect throughout appt.   also very teary-eyed discussion recent end of relationship    Assessment & Plan:   [age] yof with pmh reactive depression and anxiety presents for 6 month f/u and med refill.   Need for prophylactic vaccination and inoculation against influenza - Plan: Flu Vaccine QUAD 36+ mos IM  Depression, reactive Anxiety --Stable on current regimen --Can go up temporarily on xanax from 0.5 mg tid to  1mg  tid as needed while current stressors resolve --Lengthy discussion focusing on stressors in her life and ways to cope with them --rtc 6 months  Donnajean Lopesodd M. Deshauna Cayson, PA-C Physician Assistant-Certified [facility]  02/01/2014 8:15 PM  Meds ordered this encounter  Medications  . ALPRAZolam (XANAX) 1 MG tablet    Sig: TAKE 1 TABLET 3 TIMES A DAY AS NEEDED    Dispense:  90 tablet    Refill:  5  Will increase dose to see if it helps her through the current psychological crisis   I have participated in the care of this patient with the Advanced Practice Provider and agree with Diagnosis and Plan as  documented. [name], M.D.

## 2014-02-22 ENCOUNTER — Other Ambulatory Visit: Payer: Self-pay

## 2014-02-22 MED ORDER — VALACYCLOVIR HCL 1 G PO TABS: 1000.0000 mg | ORAL_TABLET | Freq: Every day | ORAL | Status: DC

## 2014-03-08 ENCOUNTER — Other Ambulatory Visit: Payer: Self-pay | Admitting: Gynecology

## 2014-04-27 ENCOUNTER — Ambulatory Visit (INDEPENDENT_AMBULATORY_CARE_PROVIDER_SITE_OTHER): Payer: No Typology Code available for payment source | Admitting: Gynecology

## 2014-04-27 ENCOUNTER — Encounter: Payer: Self-pay | Admitting: Gynecology

## 2014-04-27 ENCOUNTER — Other Ambulatory Visit (HOSPITAL_COMMUNITY)
Admission: RE | Admit: 2014-04-27 | Discharge: 2014-04-27 | Disposition: A | Discharge status: Home / Self Care | Payer: No Typology Code available for payment source | Source: Ambulatory Visit | Attending: Gynecology | Admitting: Gynecology

## 2014-04-27 VITALS — BP 114/70 | Ht 63.0 in | Wt 98.0 lb

## 2014-04-27 DIAGNOSIS — Z01419 Encounter for gynecological examination (general) (routine) without abnormal findings: Secondary | ICD-10-CM | POA: Insufficient documentation

## 2014-04-27 DIAGNOSIS — Z113 Encounter for screening for infections with a predominantly sexual mode of transmission: Secondary | ICD-10-CM

## 2014-04-27 LAB — CBC WITH DIFFERENTIAL/PLATELET
BASOS PCT: 0 % (ref 0–1)
Basophils Absolute: 0 10*3/uL (ref 0.0–0.1)
EOS ABS: 0.1 10*3/uL (ref 0.0–0.7)
Eosinophils Relative: 2 % (ref 0–5)
HCT: 37.7 % (ref 36.0–46.0)
HEMOGLOBIN: 12.7 g/dL (ref 12.0–15.0)
LYMPHS PCT: 45 % (ref 12–46)
Lymphs Abs: 3 10*3/uL (ref 0.7–4.0)
MCH: 31.4 pg (ref 26.0–34.0)
MCHC: 33.7 g/dL (ref 30.0–36.0)
MCV: 93.3 fL (ref 78.0–100.0)
MPV: 8.8 fL (ref 8.6–12.4)
Monocytes Absolute: 0.4 10*3/uL (ref 0.1–1.0)
Monocytes Relative: 6 % (ref 3–12)
Neutro Abs: 3.1 10*3/uL (ref 1.7–7.7)
Neutrophils Relative %: 47 % (ref 43–77)
PLATELETS: 403 10*3/uL — AB (ref 150–400)
RBC: 4.04 MIL/uL (ref 3.87–5.11)
RDW: 14.1 % (ref 11.5–15.5)
WBC: 6.7 10*3/uL (ref 4.0–10.5)

## 2014-04-27 LAB — COMPREHENSIVE METABOLIC PANEL
ALBUMIN: 4.5 g/dL (ref 3.5–5.2)
ALT: 10 U/L (ref 0–35)
AST: 15 U/L (ref 0–37)
Alkaline Phosphatase: 31 U/L — ABNORMAL LOW (ref 39–117)
BUN: 15 mg/dL (ref 6–23)
CO2: 28 meq/L (ref 19–32)
CREATININE: 0.75 mg/dL (ref 0.50–1.10)
Calcium: 9.4 mg/dL (ref 8.4–10.5)
Chloride: 100 mEq/L (ref 96–112)
GLUCOSE: 79 mg/dL (ref 70–99)
Potassium: 4.4 mEq/L (ref 3.5–5.3)
SODIUM: 136 meq/L (ref 135–145)
TOTAL PROTEIN: 6.7 g/dL (ref 6.0–8.3)
Total Bilirubin: 0.3 mg/dL (ref 0.2–1.2)

## 2014-04-27 MED ORDER — NORETHINDRONE ACET-ETHINYL EST 1-20 MG-MCG PO TABS: 1.0000 | ORAL_TABLET | Freq: Every day | ORAL | Status: DC

## 2014-04-27 NOTE — Patient Instructions (Signed)
You may obtain a copy of any labs that were done today by logging onto MyChart as outlined in the instructions provided with your AVS (after visit summary). The office will not call with normal lab results but certainly if there are any significant abnormalities then we will contact you.   Health Maintenance, Female A healthy lifestyle and preventative care can promote health and wellness.  Maintain regular health, dental, and eye exams.  Eat a healthy diet. Foods like vegetables, fruits, whole grains, low-fat dairy products, and lean protein foods contain the nutrients you need without too many calories. Decrease your intake of foods high in solid fats, added sugars, and salt. Get information about a proper diet from your caregiver, if necessary.  Regular physical exercise is one of the most important things you can do for your health. Most adults should get at least 150 minutes of moderate-intensity exercise (any activity that increases your heart rate and causes you to sweat) each week. In addition, most adults need muscle-strengthening exercises on 2 or more days a week.   Maintain a healthy weight. The body mass index (BMI) is a screening tool to identify possible weight problems. It provides an estimate of body fat based on height and weight. Your caregiver can help determine your BMI, and can help you achieve or maintain a healthy weight. For adults 20 years and older:  A BMI below 18.5 is considered underweight.  A BMI of 18.5 to 24.9 is normal.  A BMI of 25 to 29.9 is considered overweight.  A BMI of 30 and above is considered obese.  Maintain normal blood lipids and cholesterol by exercising and minimizing your intake of saturated fat. Eat a balanced diet with plenty of fruits and vegetables. Blood tests for lipids and cholesterol should begin at age 61 and be repeated every 5 years. If your lipid or cholesterol levels are high, you are over 50, or you are a high risk for heart  disease, you may need your cholesterol levels checked more frequently.Ongoing high lipid and cholesterol levels should be treated with medicines if diet and exercise are not effective.  If you smoke, find out from your caregiver how to quit. If you do not use tobacco, do not start.  Lung cancer screening is recommended for adults aged 33 80 years who are at high risk for developing lung cancer because of a history of smoking. Yearly low-dose computed tomography (CT) is recommended for people who have at least a 30-pack-year history of smoking and are a current smoker or have quit within the past 15 years. A pack year of smoking is smoking an average of 1 pack of cigarettes a day for 1 year (for example: 1 pack a day for 30 years or 2 packs a day for 15 years). Yearly screening should continue until the smoker has stopped smoking for at least 15 years. Yearly screening should also be stopped for people who develop a health problem that would prevent them from having lung cancer treatment.  If you are pregnant, do not drink alcohol. If you are breastfeeding, be very cautious about drinking alcohol. If you are not pregnant and choose to drink alcohol, do not exceed 1 drink per day. One drink is considered to be 12 ounces (355 mL) of beer, 5 ounces (148 mL) of wine, or 1.5 ounces (44 mL) of liquor.  Avoid use of street drugs. Do not share needles with anyone. Ask for help if you need support or instructions about stopping  the use of drugs.  High blood pressure causes heart disease and increases the risk of stroke. Blood pressure should be checked at least every 1 to 2 years. Ongoing high blood pressure should be treated with medicines, if weight loss and exercise are not effective.  If you are 59 to 31 years old, ask your caregiver if you should take aspirin to prevent strokes.  Diabetes screening involves taking a blood sample to check your fasting blood sugar level. This should be done once every 3  years, after age 91, if you are within normal weight and without risk factors for diabetes. Testing should be considered at a younger age or be carried out more frequently if you are overweight and have at least 1 risk factor for diabetes.  Breast cancer screening is essential preventative care for women. You should practice "breast self-awareness." This means understanding the normal appearance and feel of your breasts and may include breast self-examination. Any changes detected, no matter how small, should be reported to a caregiver. Women in their 66s and 30s should have a clinical breast exam (CBE) by a caregiver as part of a regular health exam every 1 to 3 years. After age 101, women should have a CBE every year. Starting at age 100, women should consider having a mammogram (breast X-ray) every year. Women who have a family history of breast cancer should talk to their caregiver about genetic screening. Women at a high risk of breast cancer should talk to their caregiver about having an MRI and a mammogram every year.  Breast cancer gene (BRCA)-related cancer risk assessment is recommended for women who have family members with BRCA-related cancers. BRCA-related cancers include breast, ovarian, tubal, and peritoneal cancers. Having family members with these cancers may be associated with an increased risk for harmful changes (mutations) in the breast cancer genes BRCA1 and BRCA2. Results of the assessment will determine the need for genetic counseling and BRCA1 and BRCA2 testing.  The Pap test is a screening test for cervical cancer. Women should have a Pap test starting at age 57. Between ages 25 and 35, Pap tests should be repeated every 2 years. Beginning at age 37, you should have a Pap test every 3 years as long as the past 3 Pap tests have been normal. If you had a hysterectomy for a problem that was not cancer or a condition that could lead to cancer, then you no longer need Pap tests. If you are  between ages 50 and 76, and you have had normal Pap tests going back 10 years, you no longer need Pap tests. If you have had past treatment for cervical cancer or a condition that could lead to cancer, you need Pap tests and screening for cancer for at least 20 years after your treatment. If Pap tests have been discontinued, risk factors (such as a new sexual partner) need to be reassessed to determine if screening should be resumed. Some women have medical problems that increase the chance of getting cervical cancer. In these cases, your caregiver may recommend more frequent screening and Pap tests.  The human papillomavirus (HPV) test is an additional test that may be used for cervical cancer screening. The HPV test looks for the virus that can cause the cell changes on the cervix. The cells collected during the Pap test can be tested for HPV. The HPV test could be used to screen women aged 44 years and older, and should be used in women of any age  who have unclear Pap test results. After the age of 55, women should have HPV testing at the same frequency as a Pap test.  Colorectal cancer can be detected and often prevented. Most routine colorectal cancer screening begins at the age of 44 and continues through age 20. However, your caregiver may recommend screening at an earlier age if you have risk factors for colon cancer. On a yearly basis, your caregiver may provide home test kits to check for hidden blood in the stool. Use of a small camera at the end of a tube, to directly examine the colon (sigmoidoscopy or colonoscopy), can detect the earliest forms of colorectal cancer. Talk to your caregiver about this at age 86, when routine screening begins. Direct examination of the colon should be repeated every 5 to 10 years through age 13, unless early forms of pre-cancerous polyps or small growths are found.  Hepatitis C blood testing is recommended for all people born from [birth year] through 1967 and any  individual with known risks for hepatitis C.  Practice safe sex. Use condoms and avoid high-risk sexual practices to reduce the spread of sexually transmitted infections (STIs). Sexually active women aged 36 and younger should be checked for Chlamydia, which is a common sexually transmitted infection. Older women with new or multiple partners should also be tested for Chlamydia. Testing for other STIs is recommended if you are sexually active and at increased risk.  Osteoporosis is a disease in which the bones lose minerals and strength with aging. This can result in serious bone fractures. The risk of osteoporosis can be identified using a bone density scan. Women ages 20 and over and women at risk for fractures or osteoporosis should discuss screening with their caregivers. Ask your caregiver whether you should be taking a calcium supplement or vitamin D to reduce the rate of osteoporosis.  Menopause can be associated with physical symptoms and risks. Hormone replacement therapy is available to decrease symptoms and risks. You should talk to your caregiver about whether hormone replacement therapy is right for you.  Use sunscreen. Apply sunscreen liberally and repeatedly throughout the day. You should seek shade when your shadow is shorter than you. Protect yourself by wearing long sleeves, pants, a wide-brimmed hat, and sunglasses year round, whenever you are outdoors.  Notify your caregiver of new moles or changes in moles, especially if there is a change in shape or color. Also notify your caregiver if a mole is larger than the size of a pencil eraser.  Stay current with your immunizations. Document Released: 09/30/2010 Document Revised: 07/12/2012 Document Reviewed: 09/30/2010 [facility] Patient Information 2014 [employer].

## 2014-04-27 NOTE — Progress Notes (Signed)
Katie Green 01-Dec-1985 [id number]        29 y.o.  G0P0 for annual exam.  Doing well.  Past medical history,surgical history, problem list, medications, allergies, family history and social history were all reviewed and documented as reviewed in the EPIC chart.  ROS:  Performed with pertinent positives and negatives included in the history, assessment and plan.   Additional significant findings :  none   Exam: [facility] personassistant Filed Vitals:   04/27/14 1419  BP: 114/70  Height: 5\' 3"  (1.6 m)  Weight: 98 lb (44.453 kg)   General appearance:  Normal affect, orientation and appearance. Skin: Grossly normal HEENT: Without gross lesions.  No cervical or supraclavicular adenopathy. Thyroid normal.  Lungs:  Clear without wheezing, rales or rhonchi Cardiac: RR, without RMG Abdominal:  Soft, nontender, without masses, guarding, rebound, organomegaly or hernia Breasts:  Examined lying and sitting without masses, retractions, discharge or axillary adenopathy. Pelvic:  Ext/BUS/vagina normal  Cervix normal. Pap done. GC/Chlamydia  Uterus anteverted, normal size, shape and contour, midline and mobile nontender   Adnexa  Without masses or tenderness    Anus and perineum  Normal   Rectovaginal  Normal sphincter tone without palpated masses or tenderness.    Assessment/Plan:  29 y.o. G0P0 female for annual exam.   1. Oral contraceptives. Patient doing well on Loestrin 1/20 equivalent. Once to continue. We've discussed the risks particularly with smoking of stroke heart attack DVT. Patient understands and accepts. Refill 1 year provided. 2. Pap smear/HPV negative 03/2013. 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 Pap smear 2013 ascus with negative high-risk HPV. Follow up Pap smear 2013 benign reactive changes. Pap smear 2014 normal. Pap/HPV 2015 normal. Pap smear done today. If normal then discussed possible increasing  screening interval.  Will readdress on annual basis. 3. STD screening. GC/Chlamydia done at her request. 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 Has supply at home. 5. Breast health. SBE monthly reviewed. 6. Stop smoking reviewed. 7. Health maintenance. Baseline CBC comprehensive metabolic panel urinalysis ordered. Lipid profile last year normal with cholesterol 143 LDL 72 HDL 46 triglyceride 125. Follow up in one year, sooner as needed.     Dara LordsFONTAINE,Astor Gentle P MD, 2:52 PM 04/27/2014

## 2014-04-27 NOTE — Addendum Note (Signed)
Addended by: Dayna BarkerGARDNER, KIMBERLY K on: 04/27/2014 03:03 PM   Modules accepted: Orders

## 2014-04-28 LAB — URINALYSIS W MICROSCOPIC + REFLEX CULTURE
Bacteria, UA: NONE SEEN
Bilirubin Urine: NEGATIVE
Casts: NONE SEEN
Crystals: NONE SEEN
Glucose, UA: NEGATIVE mg/dL
Hgb urine dipstick: NEGATIVE
Ketones, ur: NEGATIVE mg/dL
Leukocytes, UA: NEGATIVE
Nitrite: NEGATIVE
PH: 6.5 (ref 5.0–8.0)
Protein, ur: NEGATIVE mg/dL
Specific Gravity, Urine: 1.02 (ref 1.005–1.030)
UROBILINOGEN UA: 0.2 mg/dL (ref 0.0–1.0)

## 2014-04-28 LAB — GC/CHLAMYDIA PROBE AMP
CT Probe RNA: NEGATIVE
GC Probe RNA: NEGATIVE

## 2014-05-01 LAB — CYTOLOGY - PAP

## 2014-05-30 ENCOUNTER — Telehealth: Payer: Self-pay

## 2014-05-30 MED ORDER — CITALOPRAM HYDROBROMIDE 40 MG PO TABS: 40.0000 mg | ORAL_TABLET | Freq: Every day | ORAL | Status: DC

## 2014-05-30 NOTE — Telephone Encounter (Signed)
We are receiving a  fax from [facility] about her prescription citalopram (CELEXA) 40 MG tablet [[id number]][[id number]] and this is  an urgent matter. She also would like us to give her a 7/day refill until this urgent matter goes through. [facility]. CB#[phone]

## 2014-05-30 NOTE — Telephone Encounter (Signed)
Tell her I sent in a month supply

## 2014-05-30 NOTE — Telephone Encounter (Signed)
Dr Merla Richesoolittle, can I do this? I cannot authorize because she has not been seen for this since May. Please advise.

## 2014-05-31 NOTE — Telephone Encounter (Signed)
Left message on pt's voicemail that Rx was [facility] [address].

## 2014-06-02 ENCOUNTER — Other Ambulatory Visit: Payer: Self-pay

## 2014-06-02 MED ORDER — CITALOPRAM HYDROBROMIDE 40 MG PO TABS: 40.0000 mg | ORAL_TABLET | Freq: Every day | ORAL | Status: DC

## 2014-06-30 NOTE — Progress Notes (Signed)
This encounter was created in error - please disregard.

## 2014-08-02 ENCOUNTER — Other Ambulatory Visit: Payer: Self-pay | Admitting: Internal Medicine

## 2014-08-02 ENCOUNTER — Ambulatory Visit: Payer: No Typology Code available for payment source | Admitting: Internal Medicine

## 2014-08-02 NOTE — Telephone Encounter (Signed)
insur req chg from xanax

## 2014-08-03 NOTE — Telephone Encounter (Signed)
Faxed lorazepam. 

## 2014-08-09 ENCOUNTER — Ambulatory Visit: Payer: Self-pay | Admitting: Internal Medicine

## 2014-08-14 ENCOUNTER — Other Ambulatory Visit: Payer: Self-pay | Admitting: Internal Medicine

## 2014-08-28 ENCOUNTER — Other Ambulatory Visit: Payer: Self-pay | Admitting: Internal Medicine

## 2014-08-29 NOTE — Telephone Encounter (Signed)
See my comments

## 2014-08-29 NOTE — Telephone Encounter (Signed)
Has missed f/u so no ref Can resched as add on  Or set up w/ [facility]

## 2014-08-29 NOTE — Telephone Encounter (Signed)
Missed f/u. ? What's up

## 2014-08-30 NOTE — Telephone Encounter (Signed)
Printed and taken to [place] to sign. Faxed Rx to pharm

## 2014-08-30 NOTE — Telephone Encounter (Signed)
Dr Merla Richesoolittle, pt has appt re-scheduled for 09/27/14. There is a note on appt that pt "got message that she missed her appt on 08/02/14, that she had it written down for 08/09/14". Appears it was a mistake re: correct date of appt.

## 2014-08-30 NOTE — Telephone Encounter (Signed)
i've asked todd to sirn--I'm back Sunday

## 2014-08-30 NOTE — Telephone Encounter (Signed)
Would you refill requested meds for 1 month

## 2014-08-31 ENCOUNTER — Other Ambulatory Visit: Payer: Self-pay | Admitting: Internal Medicine

## 2014-09-01 ENCOUNTER — Other Ambulatory Visit: Payer: Self-pay | Admitting: Internal Medicine

## 2014-09-27 ENCOUNTER — Encounter: Payer: Self-pay | Admitting: Internal Medicine

## 2014-09-27 ENCOUNTER — Ambulatory Visit (INDEPENDENT_AMBULATORY_CARE_PROVIDER_SITE_OTHER): Payer: No Typology Code available for payment source | Admitting: Internal Medicine

## 2014-09-27 VITALS — BP 121/75 | HR 100 | Temp 98.1°F | Resp 16 | Ht 63.0 in | Wt 97.0 lb

## 2014-09-27 DIAGNOSIS — Z23 Encounter for immunization: Secondary | ICD-10-CM

## 2014-09-27 DIAGNOSIS — F419 Anxiety disorder, unspecified: Secondary | ICD-10-CM

## 2014-09-27 DIAGNOSIS — F329 Major depressive disorder, single episode, unspecified: Secondary | ICD-10-CM

## 2014-09-27 DIAGNOSIS — F341 Dysthymic disorder: Secondary | ICD-10-CM

## 2014-09-27 MED ORDER — LORAZEPAM 1 MG PO TABS: 1.0000 mg | ORAL_TABLET | Freq: Three times a day (TID) | ORAL | Status: DC | PRN

## 2014-09-27 MED ORDER — CITALOPRAM HYDROBROMIDE 40 MG PO TABS: 40.0000 mg | ORAL_TABLET | Freq: Every day | ORAL | Status: DC

## 2014-09-27 NOTE — Progress Notes (Signed)
Patient Active Problem List   Diagnosis Date Noted  . Complex regional pain syndrome-paim mgmt stable 07/22/2012  . 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 Nicotine dependence--self rx 07/02/2011  . Foot pain, left 07/01/2011  . 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 Cervical dysplasia   . HSV-2 (herpes simplex virus 2) infection   . Anxiety--made worse w/ depr     Current outpatient prescriptions:  .  citalopram (CELEXA) 40 MG tablet, TAKE 1 TABLET DAILY, Disp: 90 tablet, Rfl: 0 .  HORIZANT 600 MG TBCR(gabapentin) .  LORazepam (ATIVAN) 1 MG tablet, TAKE 1 TABLET BY MOUTH EVERY 8 HOURS AS NEEDED FOR ANXIETY, Disp: 90 tablet, Rfl: 0 .  Multiple Vitamin (MULTIVITAMIN) capsule, Take 1 capsule by mouth daily.  , Disp: , Rfl:  .  norethindrone-ethinyl estradiol (MICROGESTIN) 1-20 MG-MCG tablet, Take 1 tablet by mouth daily., Disp: 3 Package, Rfl: 4 .  oxyCODONE-acetaminophen (PERCOCET) 7.5-325 MG per tablet, , Disp: , Rfl: 0 .  valACYclovir (VALTREX) 1000 MG tablet, Take 1 tablet (1,000 mg total) by mouth daily., Disp: 30 tablet, Rfl: 5   Sis to [place] for next 3 weeks  2GFs w/ babies-too busy to be friends busy  Anxiety--incl social is paralyzing  Ex- BP 121/75 mmHg  Pulse 100  Temp(Src) 98.1 F (36.7 C)  Resp 16  Ht 5\' 3"  (1.6 m)  Wt 97 lb (43.999 kg)  BMI 17.19 kg/m2  SpO2 99% In tears thru disc Not suicid Not out of touch w/ reality discouraged  I/P Need for Tdap vaccination  Depression, reactive  Anxiety  Meds ordered this encounter  Medications  . citalopram (CELEXA) 40 MG tablet    Sig: Take 1 tablet (40 mg total) by mouth daily.    Dispense:  90 tablet    Refill:  3  . LORazepam (ATIVAN) 1 MG tablet    Sig: Take 1 tablet (1  mg total) by mouth every 8 (eight) hours as needed. for anxiety    Dispense:  90 tablet    Refill:  5   Disc couns//outside involkv etc

## 2014-11-08 ENCOUNTER — Encounter: Payer: Self-pay | Admitting: Internal Medicine

## 2014-11-10 ENCOUNTER — Telehealth: Payer: Self-pay

## 2014-11-10 MED ORDER — OXYCODONE-ACETAMINOPHEN 7.5-325 MG PO TABS: 1.0000 | ORAL_TABLET | Freq: Two times a day (BID) | ORAL | Status: DC | PRN

## 2014-11-10 NOTE — Telephone Encounter (Signed)
Rx in pick up draw. 

## 2014-11-10 NOTE — Telephone Encounter (Signed)
Patient is calling to request a refill for percocet. She sent an email to Dr. Merla Riches two days ago and she states that she will be going out of town tomorrow

## 2014-11-10 NOTE — Telephone Encounter (Signed)
i can't tell if this is what they were doing from the record Meds ordered this encounter  Medications  . oxyCODONE-acetaminophen (PERCOCET) 7.5-325 MG per tablet    Sig: Take 1 tablet by mouth every 12 (twelve) hours as needed for severe pain.    Dispense:  60 tablet    Refill:  0   She may need a new pain mgmt center

## 2014-11-11 ENCOUNTER — Other Ambulatory Visit: Payer: Self-pay | Admitting: Internal Medicine

## 2014-11-21 ENCOUNTER — Other Ambulatory Visit: Payer: Self-pay | Admitting: Internal Medicine

## 2014-11-21 ENCOUNTER — Encounter: Payer: Self-pay | Admitting: Internal Medicine

## 2014-11-24 ENCOUNTER — Encounter: Payer: Self-pay | Admitting: Internal Medicine

## 2014-11-24 ENCOUNTER — Other Ambulatory Visit: Payer: Self-pay | Admitting: Internal Medicine

## 2014-11-29 ENCOUNTER — Other Ambulatory Visit: Payer: Self-pay | Admitting: Internal Medicine

## 2014-12-03 ENCOUNTER — Other Ambulatory Visit: Payer: Self-pay | Admitting: Internal Medicine

## 2014-12-05 ENCOUNTER — Other Ambulatory Visit: Payer: Self-pay | Admitting: Internal Medicine

## 2014-12-05 ENCOUNTER — Telehealth: Payer: Self-pay

## 2014-12-05 MED ORDER — CITALOPRAM HYDROBROMIDE 40 MG PO TABS: 40.0000 mg | ORAL_TABLET | Freq: Every day | ORAL | Status: DC

## 2014-12-05 NOTE — Telephone Encounter (Signed)
[facility]ain[facility]

## 2014-12-05 NOTE — Telephone Encounter (Signed)
Pt states she picked up her Celexa from [facility] that was suppose to be sent to [facility] and we need To follow up with [facility]   Best phone for pt is [phone]

## 2014-12-06 MED ORDER — OXYCODONE-ACETAMINOPHEN 7.5-325 MG PO TABS: 1.0000 | ORAL_TABLET | Freq: Four times a day (QID) | ORAL | Status: DC | PRN

## 2014-12-06 NOTE — Addendum Note (Signed)
Addended by: Tonye Pearson on: 12/06/2014 11:07 AM   Modules accepted: Orders

## 2014-12-06 NOTE — Telephone Encounter (Signed)
Call to pick up Meds ordered this encounter  Medications  . oxyCODONE-acetaminophen (PERCOCET) 7.5-325 MG per tablet    Sig: Take 1 tablet by mouth every 6 (six) hours as needed for severe pain.    Dispense:  120 tablet    Refill:  0

## 2014-12-26 ENCOUNTER — Telehealth: Payer: Self-pay | Admitting: Internal Medicine

## 2014-12-26 ENCOUNTER — Other Ambulatory Visit: Payer: Self-pay | Admitting: Internal Medicine

## 2014-12-27 MED ORDER — OXYCODONE-ACETAMINOPHEN 7.5-325 MG PO TABS: 1.0000 | ORAL_TABLET | Freq: Four times a day (QID) | ORAL | Status: DC | PRN

## 2014-12-27 NOTE — Telephone Encounter (Signed)
See mess my ch Meds ordered this encounter  Medications  . oxyCODONE-acetaminophen (PERCOCET) 7.5-325 MG tablet    Sig: Take 1 tablet by mouth every 6 (six) hours as needed for severe pain.    Dispense:  120 tablet    Refill:  0

## 2015-01-13 ENCOUNTER — Other Ambulatory Visit: Payer: Self-pay | Admitting: Internal Medicine

## 2015-01-15 MED ORDER — OXYCODONE-ACETAMINOPHEN 7.5-325 MG PO TABS: 1.0000 | ORAL_TABLET | Freq: Four times a day (QID) | ORAL | Status: DC | PRN

## 2015-01-15 NOTE — Telephone Encounter (Signed)
Meds ordered this encounter  Medications  . oxyCODONE-acetaminophen (PERCOCET) 7.5-325 MG tablet    Sig: Take 1 tablet by mouth every 6 (six) hours as needed for severe pain.    Dispense:  100 tablet    Refill:  0    Written for 100 as # dispensed is capped by insurance at 100 tabs    Order Specific Question:  Supervising Provider    Answer:  Ellamae SiaOLITTLE, Amiracle Neises P [[id number]]

## 2015-01-15 NOTE — Addendum Note (Signed)
Addended by: Tonye PearsonOLITTLE, Rushawn Capshaw P on: 01/15/2015 01:17 PM   Modules accepted: Orders

## 2015-01-18 ENCOUNTER — Encounter: Payer: Self-pay | Admitting: Internal Medicine

## 2015-02-05 ENCOUNTER — Telehealth: Payer: Self-pay | Admitting: Internal Medicine

## 2015-02-05 MED ORDER — OXYCODONE-ACETAMINOPHEN 7.5-325 MG PO TABS: ORAL_TABLET | ORAL | Status: DC

## 2015-02-05 NOTE — Addendum Note (Signed)
Addended by: Tonye PearsonOLITTLE, Yolander Goodie P on: 02/05/2015 04:22 PM   Modules accepted: Orders, Medications

## 2015-02-05 NOTE — Telephone Encounter (Signed)
Done Meds ordered this encounter  Medications  . oxyCODONE-acetaminophen (PERCOCET) 7.5-325 MG tablet    Sig: One every 4-6hrs as needed for pain    Dispense:  100 tablet    Refill:  0    Written for 100 as # dispensed is capped by insurance at 100 tabs

## 2015-02-19 ENCOUNTER — Telehealth: Payer: Self-pay | Admitting: Family Medicine

## 2015-02-19 NOTE — Telephone Encounter (Signed)
lmom to call reschedule pt appt in December with [facility]   

## 2015-02-21 ENCOUNTER — Other Ambulatory Visit: Payer: Self-pay | Admitting: Internal Medicine

## 2015-02-21 MED ORDER — OXYCODONE-ACETAMINOPHEN 7.5-325 MG PO TABS: ORAL_TABLET | ORAL | Status: DC

## 2015-02-21 NOTE — Telephone Encounter (Signed)
Meds ordered this encounter  Medications  . oxyCODONE-acetaminophen (PERCOCET) 7.5-325 MG tablet    Sig: One every 4-6hrs as needed for pain    Dispense:  100 tablet    Refill:  0    Written for 100 as # dispensed is capped by insurance at 100 tabs

## 2015-02-26 ENCOUNTER — Encounter: Payer: Self-pay | Admitting: Internal Medicine

## 2015-03-07 ENCOUNTER — Other Ambulatory Visit: Payer: Self-pay | Admitting: Internal Medicine

## 2015-03-09 ENCOUNTER — Ambulatory Visit: Payer: No Typology Code available for payment source | Admitting: Internal Medicine

## 2015-03-11 ENCOUNTER — Telehealth: Payer: Self-pay | Admitting: Internal Medicine

## 2015-03-12 ENCOUNTER — Other Ambulatory Visit: Payer: Self-pay | Admitting: Internal Medicine

## 2015-03-13 ENCOUNTER — Encounter: Payer: Self-pay | Admitting: Internal Medicine

## 2015-03-13 MED ORDER — OXYCODONE-ACETAMINOPHEN 7.5-325 MG PO TABS: ORAL_TABLET | ORAL | Status: DC

## 2015-03-13 NOTE — Telephone Encounter (Signed)
Meds ordered this encounter  Medications  . oxyCODONE-acetaminophen (PERCOCET) 7.5-325 MG tablet    Sig: One every 4-6hrs as needed for pain    Dispense:  100 tablet    Refill:  0    Written for 100 as # dispensed is capped by insurance at 100 tabs    

## 2015-03-13 NOTE — Addendum Note (Signed)
Addended by: Tonye PearsonOLITTLE, Teesha Ohm P on: 03/13/2015 11:47 PM   Modules accepted: Orders

## 2015-03-14 NOTE — Telephone Encounter (Signed)
Rx in drawer. Pt notified on mychart.

## 2015-03-16 ENCOUNTER — Encounter: Payer: Self-pay | Admitting: Internal Medicine

## 2015-03-16 ENCOUNTER — Ambulatory Visit (INDEPENDENT_AMBULATORY_CARE_PROVIDER_SITE_OTHER): Payer: No Typology Code available for payment source | Admitting: Internal Medicine

## 2015-03-16 VITALS — BP 133/86 | HR 102 | Temp 98.3°F | Resp 18 | Ht 63.0 in | Wt 96.0 lb

## 2015-03-16 DIAGNOSIS — G9050 Complex regional pain syndrome I, unspecified: Secondary | ICD-10-CM | POA: Diagnosis not present

## 2015-03-16 DIAGNOSIS — F329 Major depressive disorder, single episode, unspecified: Secondary | ICD-10-CM

## 2015-03-16 DIAGNOSIS — Z23 Encounter for immunization: Secondary | ICD-10-CM | POA: Diagnosis not present

## 2015-03-16 DIAGNOSIS — F419 Anxiety disorder, unspecified: Secondary | ICD-10-CM | POA: Diagnosis not present

## 2015-03-16 DIAGNOSIS — IMO0002 Reserved for concepts with insufficient information to code with codable children: Secondary | ICD-10-CM

## 2015-03-16 DIAGNOSIS — F341 Dysthymic disorder: Secondary | ICD-10-CM

## 2015-03-16 MED ORDER — LORAZEPAM 1 MG PO TABS: 1.0000 mg | ORAL_TABLET | Freq: Three times a day (TID) | ORAL | Status: DC | PRN

## 2015-03-16 MED ORDER — OXYCODONE HCL 10 MG PO TABS: 10.0000 mg | ORAL_TABLET | Freq: Four times a day (QID) | ORAL | Status: DC | PRN

## 2015-03-16 MED ORDER — MIRTAZAPINE 15 MG PO TBDP: 15.0000 mg | ORAL_TABLET | Freq: Every day | ORAL | Status: DC

## 2015-03-16 NOTE — Patient Instructions (Addendum)
Cut celexa in half Start remeron 15 at bedtime  After 1 week , cut celexa into 4ths and take 1/4 tab for one week before quitting Double remeron when cut to 1/4 celea(30mg  remeron) When you stop celexa got to 45 remeron Take remeron at bedtime   [employer]ie whitt or [employer] kirby counselors

## 2015-03-18 NOTE — Progress Notes (Signed)
Subjective:    Patient ID: Katie Green, female    DOB: 09-Oct-1985, 29 y.o.   MRN: [id number]  HPI  Patient Active Problem List   Diagnosis Date Noted  . Complex regional pain syndrome 07/22/2012  . Depression, reactive 10/01/2011  . Nicotine dependence 07/02/2011  . Foot pain, left 07/01/2011  . 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 Cervical dysplasia   . HSV-2 (herpes simplex virus 2) infection   . Anxiety    Returns for refills/reeval In tears the entire OV as last OV Continues to be depressed and very isolated. Her niece and nephew are her reason for being! But her sis, who used to be best friend and confidant has no time for her-shows no compassion, love anymore and she is devastated.  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 Never went to counseling Depression screen [facility] 2/9 03/16/2015 09/27/2014  Decreased Interest 3 2  Down, Depressed, Hopeless 3 2  PHQ - 2 Score 6 4  Altered sleeping 3 1  Tired, decreased energy 1 0  Change in appetite 0 0  Feeling bad or failure about yourself  1 2  Trouble concentrating 1 3  Moving slowly or fidgety/restless 1 1  Suicidal thoughts 3 1  PHQ-9 Score 16 12  Difficult doing work/chores Somewhat difficult -      Still needs meds for CRPS--foot   Review of Systems  Constitutional: Negative for fever, activity change, appetite change and unexpected weight change.  Eyes: Negative for visual disturbance.  Respiratory: Negative for shortness of breath.   Cardiovascular: Negative for chest pain and palpitations.  Gastrointestinal: Negative for abdominal pain.  Genitourinary: Negative for menstrual problem.  Neurological: Negative for headaches.  Hematological: Negative for adenopathy. Does not bruise/bleed  easily.  Psychiatric/Behavioral: Negative for self-injury.       Objective:   Physical Exam  Constitutional: She is oriented to person, place, and time. She appears well-developed and well-nourished. No distress.  HENT:  Head: Normocephalic and atraumatic.  Eyes: Pupils are equal, round, and reactive to light.  Neck: Normal range of motion.  Cardiovascular: Normal rate and regular rhythm.   Pulmonary/Chest: Effort normal. No respiratory distress.  Musculoskeletal: Normal range of motion.  Neurological: She is alert and oriented to person, place, and time.  Skin: Skin is warm and dry.  Psychiatric: She has a normal mood and affect. Her behavior is normal.  Mood sad/depr affect Can't see way forward   Nursing note and vitals reviewed.  BP 133/86 mmHg  Pulse 102  Temp(Src) 98.3 F (36.8 C)  Resp 18  Ht  (1.6 m)  Wt 96 lb (43.545 kg)  BMI 17.01 kg/m2      Assessment & Plan:  Need for immunization against influenza - Plan: Flu Vaccine QUAD 36+ mos IM (Fluarix)  Depression, reactive  Anxiety  Complex regional pain syndrome  Patient Instructions  Cut celexa in half Start remeron 15 at bedtime  After 1 week , cut celexa into 4ths and take 1/4 tab for one week before quitting Double remeron when cut to 1/4 celea(30mg  remeron) When you stop celexa got to 45 remeron Take remeron at bedtime   Raynelle Fanning whitt or [employer] counselors   Meds ordered this encounter  Medications  .  LORazepam (ATIVAN) 1 MG tablet    Sig: Take 1 tablet (1 mg total) by mouth every 8 (eight) hours as needed. for anxiety    Dispense:  90 tablet    Refill:  5  . Oxycodone HCl 10 MG TABS    Sig: Take 1 tablet (10 mg total) by mouth 4 (four) times daily as needed.    Dispense:  120 tablet    Refill:  0  . mirtazapine (REMERON SOL-TAB) 15 MG disintegrating tablet    Sig: Take 1 tablet (15 mg total) by mouth at bedtime. For 7 days and then increas to 2 at hs. After 7 more days increase to 3  at hs.    Dispense:  90 tablet    Refill:  0   Fu 1 mo meds changes sooner if worse

## 2015-03-28 ENCOUNTER — Ambulatory Visit: Payer: No Typology Code available for payment source | Admitting: Internal Medicine

## 2015-04-08 ENCOUNTER — Other Ambulatory Visit: Payer: Self-pay | Admitting: Internal Medicine

## 2015-04-09 ENCOUNTER — Other Ambulatory Visit: Payer: Self-pay | Admitting: Internal Medicine

## 2015-04-09 MED ORDER — OXYCODONE HCL 10 MG PO TABS: 10.0000 mg | ORAL_TABLET | Freq: Four times a day (QID) | ORAL | Status: DC | PRN

## 2015-04-09 NOTE — Progress Notes (Signed)
Meds ordered this encounter  Medications  . Oxycodone HCl 10 MG TABS    Sig: Take 1 tablet (10 mg total) by mouth 4 (four) times daily as needed.    Dispense:  100 tablet    Refill:  0  . Oxycodone HCl 10 MG TABS    Sig: Take 1 tablet (10 mg total) by mouth 4 (four) times daily as needed. Ok to fill in 25 days after signed    Dispense:  100 tablet    Refill:  0

## 2015-04-24 ENCOUNTER — Other Ambulatory Visit: Payer: Self-pay | Admitting: Internal Medicine

## 2015-04-24 NOTE — Telephone Encounter (Signed)
Please call the patient she was given 2 Rx at the last visit -  One was for 1/25 that she should be able to fill tomorrow.

## 2015-04-25 ENCOUNTER — Other Ambulatory Visit: Payer: Self-pay | Admitting: Internal Medicine

## 2015-04-27 ENCOUNTER — Ambulatory Visit: Payer: No Typology Code available for payment source | Admitting: Internal Medicine

## 2015-04-30 ENCOUNTER — Encounter: Payer: No Typology Code available for payment source | Admitting: Gynecology

## 2015-05-16 MED ORDER — OXYCODONE HCL 10 MG PO TABS: 10.0000 mg | ORAL_TABLET | Freq: Four times a day (QID) | ORAL | Status: DC | PRN

## 2015-05-16 NOTE — Addendum Note (Signed)
Addended by: Tonye Pearson on: 05/16/2015 09:27 AM   Modules accepted: Orders

## 2015-05-16 NOTE — Telephone Encounter (Signed)
Meds ordered this encounter  Medications  . Oxycodone HCl 10 MG TABS    Sig: Take 1 tablet (10 mg total) by mouth 4 (four) times daily as needed.    Dispense:  100 tablet    Refill:  0

## 2015-05-16 NOTE — Telephone Encounter (Signed)
Notified pt on VM and also Mychart.

## 2015-05-28 ENCOUNTER — Ambulatory Visit (INDEPENDENT_AMBULATORY_CARE_PROVIDER_SITE_OTHER): Payer: BLUE CROSS/BLUE SHIELD | Admitting: Gynecology

## 2015-05-28 ENCOUNTER — Other Ambulatory Visit (HOSPITAL_COMMUNITY)
Admission: RE | Admit: 2015-05-28 | Discharge: 2015-05-28 | Disposition: A | Discharge status: Home / Self Care | Payer: BLUE CROSS/BLUE SHIELD | Source: Ambulatory Visit | Attending: Gynecology | Admitting: Gynecology

## 2015-05-28 ENCOUNTER — Encounter: Payer: Self-pay | Admitting: Gynecology

## 2015-05-28 VITALS — BP 118/64 | Ht 63.0 in | Wt 100.0 lb

## 2015-05-28 DIAGNOSIS — Z01419 Encounter for gynecological examination (general) (routine) without abnormal findings: Secondary | ICD-10-CM | POA: Diagnosis present

## 2015-05-28 DIAGNOSIS — Z113 Encounter for screening for infections with a predominantly sexual mode of transmission: Secondary | ICD-10-CM

## 2015-05-28 DIAGNOSIS — N898 Other specified noninflammatory disorders of vagina: Secondary | ICD-10-CM

## 2015-05-28 LAB — CBC WITH DIFFERENTIAL/PLATELET
BASOS PCT: 1 % (ref 0–1)
Basophils Absolute: 0.1 10*3/uL (ref 0.0–0.1)
EOS PCT: 1 % (ref 0–5)
Eosinophils Absolute: 0.1 10*3/uL (ref 0.0–0.7)
HCT: 36.2 % (ref 36.0–46.0)
Hemoglobin: 12.3 g/dL (ref 12.0–15.0)
Lymphocytes Relative: 61 % — ABNORMAL HIGH (ref 12–46)
Lymphs Abs: 5.3 10*3/uL — ABNORMAL HIGH (ref 0.7–4.0)
MCH: 31.5 pg (ref 26.0–34.0)
MCHC: 34 g/dL (ref 30.0–36.0)
MCV: 92.6 fL (ref 78.0–100.0)
MONO ABS: 0.4 10*3/uL (ref 0.1–1.0)
MPV: 9.2 fL (ref 8.6–12.4)
Monocytes Relative: 5 % (ref 3–12)
NEUTROS ABS: 2.8 10*3/uL (ref 1.7–7.7)
Neutrophils Relative %: 32 % — ABNORMAL LOW (ref 43–77)
PLATELETS: 351 10*3/uL (ref 150–400)
RBC: 3.91 MIL/uL (ref 3.87–5.11)
RDW: 13.2 % (ref 11.5–15.5)
WBC: 8.7 10*3/uL (ref 4.0–10.5)

## 2015-05-28 LAB — WET PREP FOR TRICH, YEAST, CLUE
TRICH WET PREP: NONE SEEN
YEAST WET PREP: NONE SEEN

## 2015-05-28 MED ORDER — METRONIDAZOLE 500 MG PO TABS: 500.0000 mg | ORAL_TABLET | Freq: Two times a day (BID) | ORAL | Status: DC

## 2015-05-28 MED ORDER — NORETHINDRONE ACET-ETHINYL EST 1-20 MG-MCG PO TABS: 1.0000 | ORAL_TABLET | Freq: Every day | ORAL | Status: DC

## 2015-05-28 NOTE — Addendum Note (Signed)
Addended by: Dayna Barker on: 05/28/2015 03:31 PM   Modules accepted: Orders

## 2015-05-28 NOTE — Patient Instructions (Signed)
Take the Flagyl medication twice daily for 1 week. Avoid alcohol while taking.  You may obtain a copy of any labs that were done today by logging onto MyChart as outlined in the instructions provided with your AVS (after visit summary). The office will not call with normal lab results but certainly if there are any significant abnormalities then we will contact you.   Health Maintenance Adopting a healthy lifestyle and getting preventive care can go a long way to promote health and wellness. Talk with your health care provider about what schedule of regular examinations is right for you. This is a good chance for you to check in with your provider about disease prevention and staying healthy. In between checkups, there are plenty of things you can do on your own. Experts have done a lot of research about which lifestyle changes and preventive measures are most likely to keep you healthy. Ask your health care provider for more information. WEIGHT AND DIET  Eat a healthy diet  Be sure to include plenty of vegetables, fruits, low-fat dairy products, and lean protein.  Do not eat a lot of foods high in solid fats, added sugars, or salt.  Get regular exercise. This is one of the most important things you can do for your health.  Most adults should exercise for at least 150 minutes each week. The exercise should increase your heart rate and make you sweat (moderate-intensity exercise).  Most adults should also do strengthening exercises at least twice a week. This is in addition to the moderate-intensity exercise.  Maintain a healthy weight  Body mass index (BMI) is a measurement that can be used to identify possible weight problems. It estimates body fat based on height and weight. Your health care provider can help determine your BMI and help you achieve or maintain a healthy weight.  For females 32 years of age and older:   A BMI below 18.5 is considered underweight.  A BMI of 18.5 to 24.9  is normal.  A BMI of 25 to 29.9 is considered overweight.  A BMI of 30 and above is considered obese.  Watch levels of cholesterol and blood lipids  You should start having your blood tested for lipids and cholesterol at 32 years of age, then have this test every 5 years.  You may need to have your cholesterol levels checked more often if:  Your lipid or cholesterol levels are high.  You are older than 32 years of age.  You are at high risk for heart disease.  CANCER SCREENING   Lung Cancer  Lung cancer screening is recommended for adults 8-39 years old who are at high risk for lung cancer because of a history of smoking.  A yearly low-dose CT scan of the lungs is recommended for people who:  Currently smoke.  Have quit within the past 15 years.  Have at least a 30-pack-year history of smoking. A pack year is smoking an average of one pack of cigarettes a day for 1 year.  Yearly screening should continue until it has been 15 years since you quit.  Yearly screening should stop if you develop a health problem that would prevent you from having lung cancer treatment.  Breast Cancer  Practice breast self-awareness. This means understanding how your breasts normally appear and feel.  It also means doing regular breast self-exams. Let your health care provider know about any changes, no matter how small.  If you are in your 32s or 30s, you  should have a clinical breast exam (CBE) by a health care provider every 1-3 years as part of a regular health exam.  If you are 32 or older, have a CBE every year. Also consider having a breast X-ray (mammogram) every year.  If you have a family history of breast cancer, talk to your health care provider about genetic screening.  If you are at high risk for breast cancer, talk to your health care provider about having an MRI and a mammogram every year.  Breast cancer gene (BRCA) assessment is recommended for women who have family  members with BRCA-related cancers. BRCA-related cancers include:  Breast.  Ovarian.  Tubal.  Peritoneal cancers.  Results of the assessment will determine the need for genetic counseling and BRCA1 and BRCA2 testing. Cervical Cancer Routine pelvic examinations to screen for cervical cancer are no longer recommended for nonpregnant women who are considered low risk for cancer of the pelvic organs (ovaries, uterus, and vagina) and who do not have symptoms. A pelvic examination may be necessary if you have symptoms including those associated with pelvic infections. Ask your health care provider if a screening pelvic exam is right for you.   The Pap test is the screening test for cervical cancer for women who are considered at risk.  If you had a hysterectomy for a problem that was not cancer or a condition that could lead to cancer, then you no longer need Pap tests.  If you are older than 32 years, and you have had normal Pap tests for the past 10 years, you no longer need to have Pap tests.  If you have had past treatment for cervical cancer or a condition that could lead to cancer, you need Pap tests and screening for cancer for at least 20 years after your treatment.  If you no longer get a Pap test, assess your risk factors if they change (such as having a new sexual partner). This can affect whether you should start being screened again.  Some women have medical problems that increase their chance of getting cervical cancer. If this is the case for you, your health care provider may recommend more frequent screening and Pap tests.  The human papillomavirus (HPV) test is another test that may be used for cervical cancer screening. The HPV test looks for the virus that can cause cell changes in the cervix. The cells collected during the Pap test can be tested for HPV.  The HPV test can be used to screen women 32 years of age and older. Getting tested for HPV can extend the interval  between normal Pap tests from three to five years.  An HPV test also should be used to screen women of any age who have unclear Pap test results.  After 32 years of age, women should have HPV testing as often as Pap tests.  Colorectal Cancer  This type of cancer can be detected and often prevented.  Routine colorectal cancer screening usually begins at 32 years of age and continues through 32 years of age.  Your health care provider may recommend screening at an earlier age if you have risk factors for colon cancer.  Your health care provider may also recommend using home test kits to check for hidden blood in the stool.  A small camera at the end of a tube can be used to examine your colon directly (sigmoidoscopy or colonoscopy). This is done to check for the earliest forms of colorectal cancer.  Routine screening  usually begins at age 38.  Direct examination of the colon should be repeated every 5-10 years through 32 years of age. However, you may need to be screened more often if early forms of precancerous polyps or small growths are found. Skin Cancer  Check your skin from head to toe regularly.  Tell your health care provider about any new moles or changes in moles, especially if there is a change in a mole's shape or color.  Also tell your health care provider if you have a mole that is larger than the size of a pencil eraser.  Always use sunscreen. Apply sunscreen liberally and repeatedly throughout the day.  Protect yourself by wearing long sleeves, pants, a wide-brimmed hat, and sunglasses whenever you are outside. HEART DISEASE, DIABETES, AND HIGH BLOOD PRESSURE   Have your blood pressure checked at least every 1-2 years. High blood pressure causes heart disease and increases the risk of stroke.  If you are between 81 years and 25 years old, ask your health care provider if you should take aspirin to prevent strokes.  Have regular diabetes screenings. This involves  taking a blood sample to check your fasting blood sugar level.  If you are at a normal weight and have a low risk for diabetes, have this test once every three years after 32 years of age.  If you are overweight and have a high risk for diabetes, consider being tested at a younger age or more often. PREVENTING INFECTION  Hepatitis B  If you have a higher risk for hepatitis B, you should be screened for this virus. You are considered at high risk for hepatitis B if:  You were born in a country where hepatitis B is common. Ask your health care provider which countries are considered high risk.  Your parents were born in a high-risk country, and you have not been immunized against hepatitis B (hepatitis B vaccine).  You have HIV or AIDS.  You use needles to inject street drugs.  You live with someone who has hepatitis B.  You have had sex with someone who has hepatitis B.  You get hemodialysis treatment.  You take certain medicines for conditions, including cancer, organ transplantation, and autoimmune conditions. Hepatitis C  Blood testing is recommended for:  Everyone born from [birth year] through 1967.  Anyone with known risk factors for hepatitis C. Sexually transmitted infections (STIs)  You should be screened for sexually transmitted infections (STIs) including gonorrhea and chlamydia if:  You are sexually active and are younger than 32 years of age.  You are older than 32 years of age and your health care provider tells you that you are at risk for this type of infection.  Your sexual activity has changed since you were last screened and you are at an increased risk for chlamydia or gonorrhea. Ask your health care provider if you are at risk.  If you do not have HIV, but are at risk, it may be recommended that you take a prescription medicine daily to prevent HIV infection. This is called pre-exposure prophylaxis (PrEP). You are considered at risk if:  You are sexually active  and do not regularly use condoms or know the HIV status of your partner(s).  You take drugs by injection.  You are sexually active with a partner who has HIV. Talk with your health care provider about whether you are at high risk of being infected with HIV. If you choose to begin PrEP, you should first be tested  for HIV. You should then be tested every 3 months for as long as you are taking PrEP.  PREGNANCY   If you are premenopausal and you may become pregnant, ask your health care provider about preconception counseling.  If you may become pregnant, take 400 to 800 micrograms (mcg) of folic acid every day.  If you want to prevent pregnancy, talk to your health care provider about birth control (contraception). OSTEOPOROSIS AND MENOPAUSE   Osteoporosis is a disease in which the bones lose minerals and strength with aging. This can result in serious bone fractures. Your risk for osteoporosis can be identified using a bone density scan.  If you are 23 years of age or older, or if you are at risk for osteoporosis and fractures, ask your health care provider if you should be screened.  Ask your health care provider whether you should take a calcium or vitamin D supplement to lower your risk for osteoporosis.  Menopause may have certain physical symptoms and risks.  Hormone replacement therapy may reduce some of these symptoms and risks. Talk to your health care provider about whether hormone replacement therapy is right for you.  HOME CARE INSTRUCTIONS   Schedule regular health, dental, and eye exams.  Stay current with your immunizations.   Do not use any tobacco products including cigarettes, chewing tobacco, or electronic cigarettes.  If you are pregnant, do not drink alcohol.  If you are breastfeeding, limit how much and how often you drink alcohol.  Limit alcohol intake to no more than 1 drink per day for nonpregnant women. One drink equals 12 ounces of beer, 5 ounces of wine,  or 1 ounces of hard liquor.  Do not use street drugs.  Do not share needles.  Ask your health care provider for help if you need support or information about quitting drugs.  Tell your health care provider if you often feel depressed.  Tell your health care provider if you have ever been abused or do not feel safe at home. Document Released: 09/30/2010 Document Revised: 08/01/2013 Document Reviewed: 02/16/2013 [facility] Patient Information 2015 [place], [place]. This information is not intended to replace advice given to you by your health care provider. Make sure you discuss any questions you have with your health care provider.

## 2015-05-28 NOTE — Progress Notes (Signed)
Katie Green [birth year] [id number]        30 y.o.  G0P0  for annual exam.  Doing well without complaints  Past medical history,surgical history, problem list, medications, allergies, family history and social history were all reviewed and documented as reviewed in the EPIC chart.  ROS:  Performed with pertinent positives and negatives included in the history, assessment and plan.   Additional significant findings :  none   Exam: [name] assistant Filed Vitals:   05/28/15 1405  BP: 118/64  Height:  (1.6 m)  Weight: 100 lb (45.36 kg)   General appearance:  Normal affect, orientation and appearance. Skin: Grossly normal HEENT: Without gross lesions.  No cervical or supraclavicular adenopathy. Thyroid normal.  Lungs:  Clear without wheezing, rales or rhonchi Cardiac: RR, without RMG Abdominal:  Soft, nontender, without masses, guarding, rebound, organomegaly or hernia Breasts:  Examined lying and sitting without masses, retractions, discharge or axillary adenopathy. Pelvic:  Ext/BUS/vagina with thick white discharge  Cervix normal Pap smear, GC/chlamydia  Uterus anteverted, normal size, shape and contour, midline and mobile nontender   Adnexa without masses or tenderness    Anus and perineum normal   Rectovaginal normal sphincter tone without palpated masses or tenderness.    Assessment/Plan:  30 y.o. G0P0 female for annual exam with regular menses, oral contraceptives.   1. Oral contraceptives. Patient on Loestrin 1/20 equivalent doing well wants to continue. Refill 1 year provided.  Stop smoking again reviewed and increased risk of thrombosis associated with this discussed. 2. White heavy discharge. Patient without symptoms. Wet prep consistent with bacterial vaginosis. Will cover with Flagyl 500 mg twice a day 7 days. Alcohol avoidance reviewed. 3. History of HSV-2. Taking Valtrex 500 mg daily. Has prescription through Dr. Merla Riches. 4. Pap smear 03/2014 normal.  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 Last for Pap smears have been negative to include a negative HPV. Discussed less frequent screening intervals patients very uncomfortable with this and Pap smear was done today. 5. Breast health. SBE monthly reviewed. 6. STD screening. GC/Chlamydia done at her request. No known exposure but wants to be screened. She declined serum screening 7. Stop smoking again discussed and encouraged. Patient states that she really is trying to cut down and ultimately quit. 8. Health maintenance. Baseline CBC comprehensive metabolic panel urinalysis ordered. Lipid profile 2 years ago normal. Will repeat next year at three-year interval. Follow up in one year, sooner if needed.   Dara Lords MD, 2:46 PM 05/28/2015

## 2015-05-29 ENCOUNTER — Other Ambulatory Visit: Payer: Self-pay | Admitting: Gynecology

## 2015-05-29 DIAGNOSIS — D709 Neutropenia, unspecified: Secondary | ICD-10-CM

## 2015-05-29 DIAGNOSIS — D72820 Lymphocytosis (symptomatic): Secondary | ICD-10-CM

## 2015-05-29 LAB — URINALYSIS W MICROSCOPIC + REFLEX CULTURE
Bilirubin Urine: NEGATIVE
Casts: NONE SEEN [LPF]
Crystals: NONE SEEN [HPF]
GLUCOSE, UA: NEGATIVE
HGB URINE DIPSTICK: NEGATIVE
Ketones, ur: NEGATIVE
Nitrite: NEGATIVE
PROTEIN: NEGATIVE
Specific Gravity, Urine: 1.017 (ref 1.001–1.035)
YEAST: NONE SEEN [HPF]
pH: 6 (ref 5.0–8.0)

## 2015-05-29 LAB — COMPREHENSIVE METABOLIC PANEL
ALBUMIN: 4.5 g/dL (ref 3.6–5.1)
ALK PHOS: 30 U/L — AB (ref 33–115)
ALT: 9 U/L (ref 6–29)
AST: 13 U/L (ref 10–30)
BILIRUBIN TOTAL: 0.3 mg/dL (ref 0.2–1.2)
BUN: 15 mg/dL (ref 7–25)
CHLORIDE: 99 mmol/L (ref 98–110)
CO2: 27 mmol/L (ref 20–31)
CREATININE: 0.95 mg/dL (ref 0.50–1.10)
Calcium: 9.1 mg/dL (ref 8.6–10.2)
Glucose, Bld: 64 mg/dL — ABNORMAL LOW (ref 65–99)
Potassium: 3.6 mmol/L (ref 3.5–5.3)
SODIUM: 137 mmol/L (ref 135–146)
TOTAL PROTEIN: 6.5 g/dL (ref 6.1–8.1)

## 2015-05-29 LAB — GC/CHLAMYDIA PROBE AMP
CT PROBE, AMP APTIMA: NOT DETECTED
GC PROBE AMP APTIMA: NOT DETECTED

## 2015-05-30 ENCOUNTER — Other Ambulatory Visit: Payer: Self-pay | Admitting: Gynecology

## 2015-05-30 LAB — CYTOLOGY - PAP

## 2015-05-30 MED ORDER — SULFAMETHOXAZOLE-TRIMETHOPRIM 800-160 MG PO TABS: 1.0000 | ORAL_TABLET | Freq: Two times a day (BID) | ORAL | Status: DC

## 2015-05-31 LAB — URINE CULTURE

## 2015-06-09 ENCOUNTER — Other Ambulatory Visit: Payer: Self-pay | Admitting: Internal Medicine

## 2015-06-11 ENCOUNTER — Other Ambulatory Visit: Payer: Self-pay | Admitting: Internal Medicine

## 2015-06-13 ENCOUNTER — Telehealth: Payer: Self-pay

## 2015-06-13 MED ORDER — OXYCODONE HCL 10 MG PO TABS: 10.0000 mg | ORAL_TABLET | Freq: Four times a day (QID) | ORAL | Status: DC | PRN

## 2015-06-13 NOTE — Telephone Encounter (Signed)
Meds ordered this encounter  Medications  . Oxycodone HCl 10 MG TABS    Sig: Take 1 tablet (10 mg total) by mouth 4 (four) times daily as needed.    Dispense:  100 tablet    Refill:  0   Pharm limit is 100

## 2015-06-13 NOTE — Telephone Encounter (Signed)
LMVM RX ready to be picked up at front desk of [facility] appt center.

## 2015-06-13 NOTE — Addendum Note (Signed)
Addended by: Tonye PearsonOLITTLE, ROBERT P on: 06/13/2015 11:00 AM   Modules accepted: Orders

## 2015-06-15 MED ORDER — OXYCODONE HCL ER 20 MG PO T12A: 20.0000 mg | EXTENDED_RELEASE_TABLET | Freq: Two times a day (BID) | ORAL | Status: DC

## 2015-06-15 NOTE — Addendum Note (Signed)
Addended by: Tonye PearsonOLITTLE, Abdulkarim Eberlin P on: 06/15/2015 12:41 PM   Modules accepted: Orders

## 2015-06-15 NOTE — Telephone Encounter (Signed)
Trial Meds ordered this encounter  Medications  . Oxycodone HCl 10 MG TABS---can't be filled til 22nd    Sig: Take 1 tablet (10 mg total) by mouth 4 (four) times daily as needed.    Dispense:  100 tablet    Refill:  0  . oxyCODONE (OXYCONTIN) 20 mg 12 hr tablet as a trial    Sig: Take 1 tablet (20 mg total) by mouth every 12 (twelve) hours.    Dispense:  14 tablet    Refill:  0

## 2015-06-15 NOTE — Telephone Encounter (Signed)
Rx is in drawer at 102. I called pt to let her know location change so that she can p/up over the weekend or today when appt center closed.

## 2015-06-26 ENCOUNTER — Other Ambulatory Visit: Payer: BLUE CROSS/BLUE SHIELD

## 2015-06-26 DIAGNOSIS — D72820 Lymphocytosis (symptomatic): Secondary | ICD-10-CM

## 2015-06-26 DIAGNOSIS — D709 Neutropenia, unspecified: Secondary | ICD-10-CM

## 2015-06-26 LAB — CBC WITH DIFFERENTIAL/PLATELET
BASOS ABS: 0.1 10*3/uL (ref 0.0–0.1)
BASOS PCT: 1 % (ref 0–1)
EOS ABS: 0.1 10*3/uL (ref 0.0–0.7)
EOS PCT: 2 % (ref 0–5)
HCT: 34.3 % — ABNORMAL LOW (ref 36.0–46.0)
Hemoglobin: 11.5 g/dL — ABNORMAL LOW (ref 12.0–15.0)
Lymphocytes Relative: 64 % — ABNORMAL HIGH (ref 12–46)
Lymphs Abs: 4.4 10*3/uL — ABNORMAL HIGH (ref 0.7–4.0)
MCH: 31.1 pg (ref 26.0–34.0)
MCHC: 33.5 g/dL (ref 30.0–36.0)
MCV: 92.7 fL (ref 78.0–100.0)
MPV: 9.4 fL (ref 8.6–12.4)
Monocytes Absolute: 0.5 10*3/uL (ref 0.1–1.0)
Monocytes Relative: 7 % (ref 3–12)
NEUTROS PCT: 26 % — AB (ref 43–77)
Neutro Abs: 1.8 10*3/uL (ref 1.7–7.7)
PLATELETS: 342 10*3/uL (ref 150–400)
RBC: 3.7 MIL/uL — AB (ref 3.87–5.11)
RDW: 13.9 % (ref 11.5–15.5)
WBC: 6.8 10*3/uL (ref 4.0–10.5)

## 2015-06-28 ENCOUNTER — Other Ambulatory Visit: Payer: Self-pay | Admitting: Gynecology

## 2015-06-28 DIAGNOSIS — D72820 Lymphocytosis (symptomatic): Secondary | ICD-10-CM

## 2015-06-28 DIAGNOSIS — D708 Other neutropenia: Secondary | ICD-10-CM

## 2015-07-09 ENCOUNTER — Other Ambulatory Visit: Payer: Self-pay | Admitting: Internal Medicine

## 2015-07-11 MED ORDER — OXYCODONE HCL 10 MG PO TABS: ORAL_TABLET | ORAL | Status: DC

## 2015-07-11 NOTE — Addendum Note (Signed)
Addended by: Tonye PearsonOLITTLE, Lyndell Allaire P on: 07/11/2015 11:39 AM   Modules accepted: Orders

## 2015-07-11 NOTE — Telephone Encounter (Signed)
Meds ordered this encounter  Medications  . Oxycodone HCl 10 MG TABS    Sig: q 4-6h prn    Dispense:  100 tablet    Refill:  0   Pharmacy limits to 100

## 2015-07-29 ENCOUNTER — Other Ambulatory Visit: Payer: Self-pay | Admitting: Internal Medicine

## 2015-07-30 MED ORDER — OXYCODONE HCL 10 MG PO TABS: ORAL_TABLET | ORAL | Status: DC

## 2015-07-30 NOTE — Addendum Note (Signed)
Addended by: Tonye PearsonOLITTLE, Nakiya Rallis P on: 07/30/2015 04:10 PM   Modules accepted: Orders

## 2015-08-19 ENCOUNTER — Other Ambulatory Visit: Payer: Self-pay | Admitting: Internal Medicine

## 2015-08-21 ENCOUNTER — Other Ambulatory Visit: Payer: Self-pay | Admitting: Internal Medicine

## 2015-08-21 NOTE — Progress Notes (Signed)
I don't see the med being requested but from Dr Doolittle's message it appears to be for Oxycodone. I will pend this is someone can write it for him as instr'd.

## 2015-08-21 NOTE — Progress Notes (Signed)
She is limited by her insurance to 100 tabs at a time lasting 22-23 days Ok to have someone sign one rx  Where is she inplanning followup. Pain management? One of the PAs as new pcp?

## 2015-08-22 ENCOUNTER — Other Ambulatory Visit: Payer: Self-pay | Admitting: Internal Medicine

## 2015-08-22 ENCOUNTER — Other Ambulatory Visit: Payer: Self-pay | Admitting: Radiology

## 2015-08-22 ENCOUNTER — Telehealth: Payer: Self-pay | Admitting: *Deleted

## 2015-08-22 MED ORDER — OXYCODONE HCL 10 MG PO TABS: ORAL_TABLET | ORAL | Status: DC

## 2015-08-22 NOTE — Telephone Encounter (Signed)
error 

## 2015-08-22 NOTE — Telephone Encounter (Signed)
Patient has requested Lorazepam refill  Pended /rx previous by Dr Merla Richesoolittle

## 2015-08-22 NOTE — Addendum Note (Signed)
Addended by: Fernande BrasJEFFERY, Rubby Barbary S on: 08/22/2015 02:13 PM   Modules accepted: Orders

## 2015-08-22 NOTE — Telephone Encounter (Signed)
Patient notified via My Chart.  Meds ordered this encounter  Medications  . Oxycodone HCl 10 MG TABS    Sig: q 4-6h prn    Dispense:  100 tablet    Refill:  0    Order Specific Question:  Supervising Provider    Answer:  DOOLITTLE, ROBERT P [[id number]]

## 2015-08-22 NOTE — Addendum Note (Signed)
Addended by: Fernande BrasJEFFERY, Lynnett Langlinais S on: 08/22/2015 02:12 PM   Modules accepted: Orders

## 2015-08-23 MED ORDER — LORAZEPAM 1 MG PO TABS: 1.0000 mg | ORAL_TABLET | Freq: Three times a day (TID) | ORAL | Status: DC | PRN

## 2015-08-23 NOTE — Telephone Encounter (Signed)
Patient notified via My Chart

## 2015-08-23 NOTE — Telephone Encounter (Signed)
Meds ordered this encounter  Medications  . LORazepam (ATIVAN) 1 MG tablet    Sig: Take 1 tablet (1 mg total) by mouth every 8 (eight) hours as needed. for anxiety    Dispense:  90 tablet    Refill:  0

## 2015-08-24 NOTE — Telephone Encounter (Signed)
Faxed and notified pt on mychart 

## 2015-08-24 NOTE — Telephone Encounter (Signed)
In drawer

## 2015-09-06 ENCOUNTER — Other Ambulatory Visit: Payer: BLUE CROSS/BLUE SHIELD

## 2015-09-06 DIAGNOSIS — D72820 Lymphocytosis (symptomatic): Secondary | ICD-10-CM

## 2015-09-06 DIAGNOSIS — D708 Other neutropenia: Secondary | ICD-10-CM

## 2015-09-06 LAB — CBC
HCT: 40.1 % (ref 35.0–45.0)
HEMOGLOBIN: 13.4 g/dL (ref 11.7–15.5)
MCH: 31.1 pg (ref 27.0–33.0)
MCHC: 33.4 g/dL (ref 32.0–36.0)
MCV: 93 fL (ref 80.0–100.0)
MPV: 9.3 fL (ref 7.5–12.5)
PLATELETS: 414 10*3/uL — AB (ref 140–400)
RBC: 4.31 MIL/uL (ref 3.80–5.10)
RDW: 13.5 % (ref 11.0–15.0)
WBC: 6.8 10*3/uL (ref 3.8–10.8)

## 2015-09-10 ENCOUNTER — Encounter: Payer: Self-pay | Admitting: Internal Medicine

## 2015-09-11 ENCOUNTER — Other Ambulatory Visit: Payer: Self-pay | Admitting: Physician Assistant

## 2015-09-14 MED ORDER — OXYCODONE HCL 10 MG PO TABS: ORAL_TABLET | ORAL | Status: DC

## 2015-09-14 NOTE — Addendum Note (Signed)
Addended by: Tonye PearsonOLITTLE, Miyoko Hashimi P on: 09/14/2015 05:29 PM   Modules accepted: Orders

## 2015-09-14 NOTE — Telephone Encounter (Signed)
Meds ordered this encounter  Medications  . Oxycodone HCl 10 MG TABS    Sig: q 4-6h prn    Dispense:  100 tablet    Refill:  0    Order Specific Question:  Supervising Provider    Answer:  Judd Mccubbin P [[id number]]

## 2015-09-16 ENCOUNTER — Other Ambulatory Visit: Payer: Self-pay | Admitting: Physician Assistant

## 2015-09-18 NOTE — Telephone Encounter (Signed)
Notified pt on Mychart that Rx is here for p/up if she wants it before we can fax/phone it in due our system being down.

## 2015-09-20 ENCOUNTER — Other Ambulatory Visit: Payer: Self-pay | Admitting: Physician Assistant

## 2015-09-21 ENCOUNTER — Telehealth: Payer: Self-pay | Admitting: *Deleted

## 2015-09-21 ENCOUNTER — Telehealth: Payer: Self-pay

## 2015-09-21 NOTE — Telephone Encounter (Signed)
Called spoke to patient, she will pick up lorazepam Rx today before 6 pm today or tomorrow before 4 pm.

## 2015-09-21 NOTE — Telephone Encounter (Signed)
Pt is checking on status of her refill request about his lorazapan -she talked with Britta Mccreedybarbara earlier in the week regarding this on my chart and was asked by [place] if she wanted it printed out and Britta Mccreedybarbara has not got it printed out and it is not ready  For paitient and she is having panic attacks best number [phone]  Please call patient today

## 2015-10-03 ENCOUNTER — Other Ambulatory Visit: Payer: Self-pay | Admitting: Internal Medicine

## 2015-10-04 MED ORDER — OXYCODONE HCL 10 MG PO TABS: ORAL_TABLET | ORAL | Status: DC

## 2015-10-04 NOTE — Telephone Encounter (Signed)
Meds ordered this encounter  Medications  . Oxycodone HCl 10 MG TABS    Sig: q 4-6h prn    Dispense:  100 tablet    Refill:  0

## 2015-10-04 NOTE — Telephone Encounter (Signed)
Rx in drawer. Notified pt by mychart and asked her Dr Doolittle's question.

## 2015-10-08 NOTE — Telephone Encounter (Signed)
Katie BeechamCynthia spoke to pt, duplicate message.

## 2015-10-18 ENCOUNTER — Encounter: Payer: Self-pay | Admitting: Gynecology

## 2015-10-18 ENCOUNTER — Other Ambulatory Visit: Payer: Self-pay | Admitting: Internal Medicine

## 2015-10-22 ENCOUNTER — Other Ambulatory Visit: Payer: Self-pay | Admitting: Internal Medicine

## 2015-10-22 NOTE — Telephone Encounter (Signed)
Pt called to ask for RF of lorazepam. When Dr Merla Riches wrote her last pain med Rx on 7/5, he wanted me to send message to pt to ask her some questions. I will paste her reply here:  " I will be going to the same pain management office that I was previously going to. That is also who was treating me for my foot. For my new primary care provider I have not decided between the 2 that Dr. Merla Riches recommended"  Please advise on lorazepam RF.

## 2015-10-23 MED ORDER — LORAZEPAM 1 MG PO TABS: 1.0000 mg | ORAL_TABLET | Freq: Three times a day (TID) | ORAL | 0 refills | Status: DC | PRN

## 2015-10-23 NOTE — Telephone Encounter (Signed)
LMOM advising that RF was faxed to pharm and gave her info on Chelle's message. Asked for CB w/any further ?s.

## 2015-10-23 NOTE — Telephone Encounter (Signed)
Meds ordered this encounter  Medications  . LORazepam (ATIVAN) 1 MG tablet    Sig: Take 1 tablet (1 mg total) by mouth every 8 (eight) hours as needed. for anxiety    Dispense:  90 tablet    Refill:  0    Not to exceed 5 additional fills before 02/20/2016    This is the last prescription of this before she establishes with an alternate PCP. Dr. Merla Riches recommended Deliah Boston, PA-C in a previous message (he also recommended Deboraha Sprang, FNP, but she has left our practice). The delays she has experienced will be less once she establishes with a new PCP.  When is her visit with Pain Management ("mid-July"?).

## 2015-10-25 ENCOUNTER — Other Ambulatory Visit: Payer: Self-pay | Admitting: Physician Assistant

## 2015-10-25 MED ORDER — LORAZEPAM 1 MG PO TABS: 1.0000 mg | ORAL_TABLET | Freq: Three times a day (TID) | ORAL | 0 refills | Status: DC | PRN

## 2015-10-25 NOTE — Progress Notes (Signed)
Patient notified via My Chart.  Meds ordered this encounter  Medications  . LORazepam (ATIVAN) 1 MG tablet    Sig: Take 1 tablet (1 mg total) by mouth every 8 (eight) hours as needed. for anxiety    Dispense:  90 tablet    Refill:  0    Not to exceed 5 additional fills before 02/20/2016    Order Specific Question:   Supervising Provider    Answer:   Sherren Mocha [[id number]]

## 2015-10-25 NOTE — Telephone Encounter (Signed)
Faxed. Pt aware by [employer]

## 2015-10-30 ENCOUNTER — Other Ambulatory Visit: Payer: Self-pay | Admitting: Internal Medicine

## 2015-11-01 NOTE — Telephone Encounter (Signed)
Patient needs OV and to establish with new pcp for refills.  Patient notified via My Chart.

## 2015-11-02 MED ORDER — OXYCODONE HCL 10 MG PO TABS: ORAL_TABLET | ORAL | 0 refills | Status: DC

## 2015-11-02 NOTE — Telephone Encounter (Signed)
I will give the patient # 20 pills that she should use until she is able to see Deliah Boston

## 2015-11-05 NOTE — Telephone Encounter (Signed)
Pt has already p/up her Rx.

## 2015-11-08 ENCOUNTER — Encounter: Payer: Self-pay | Admitting: Physician Assistant

## 2015-11-08 ENCOUNTER — Ambulatory Visit (INDEPENDENT_AMBULATORY_CARE_PROVIDER_SITE_OTHER): Payer: BLUE CROSS/BLUE SHIELD | Admitting: Physician Assistant

## 2015-11-08 VITALS — BP 108/76 | HR 98 | Temp 99.2°F | Resp 16 | Ht 63.0 in | Wt 99.0 lb

## 2015-11-08 DIAGNOSIS — F1510 Other stimulant abuse, uncomplicated: Secondary | ICD-10-CM

## 2015-11-08 DIAGNOSIS — F1190 Opioid use, unspecified, uncomplicated: Secondary | ICD-10-CM

## 2015-11-08 DIAGNOSIS — Z79899 Other long term (current) drug therapy: Secondary | ICD-10-CM

## 2015-11-08 DIAGNOSIS — F1590 Other stimulant use, unspecified, uncomplicated: Secondary | ICD-10-CM

## 2015-11-08 NOTE — Patient Instructions (Signed)
     IF you received an x-ray today, you will receive an invoice from [facility]. Please contact [facility] at [phone] with questions or concerns regarding your invoice.   IF you received labwork today, you will receive an invoice from [facility]. Please contact [facility] at [phone] with questions or concerns regarding your invoice.   Our billing staff will not be able to assist you with questions regarding bills from these companies.  You will be contacted with the lab results as soon as they are available. The fastest way to get your results is to activate your My Chart account. Instructions are located on the last page of this paperwork. If you have not heard from us regarding the results in 2 weeks, please contact this office.      

## 2015-11-08 NOTE — Progress Notes (Addendum)
11/08/2015 6:51 PM   DOB: 1985-05-23 / MRN: [id number]  SUBJECTIVE:  Katie Green is a 30 y.o. female presenting for refills of controlled substances.  She has a history of complex regional pain syndrome and reactive depression.  She is taking oxycodone 10 mg tabs every 4-6 hours.  She is taking Ativan 3 times daily.  I have never seen her before. She is under the impression that I will be taking over this medications for her. The mychart correspondence that lead her to me today is as follows:  "I am so sorry that you were given incorrect information. As a result, I will give you enough to take a pill every 6 h for the next 5 days which will get you to Thursday. Be sure to call Wednesday afternoon around 4pm to schedule that appt with Casimiro Needle.    I will let you know that the CDC has encouraged [place] to stop writing both benzos and opiates for patients and I have spoke to Casimiro Needle and he is not willing to continue to write both of those medications (I also do not know anyone else in our office who is willing to do this) but he is willing to help you figure out the next step in your care and if you desire to help you get off one of these medications. I thought you would want to know this so you can plan accordingly.   Maralyn Sago"   Depression screen [facility] 2/9 11/08/2015 03/16/2015 09/27/2014  Decreased Interest 2 3 2   Down, Depressed, Hopeless 2 3 2   PHQ - 2 Score 4 6 4   Altered sleeping 3 3 1   Tired, decreased energy 1 1 0  Change in appetite 0 0 0  Feeling bad or failure about yourself  3 1 2   Trouble concentrating 1 1 3   Moving slowly or fidgety/restless 1 1 1   Suicidal thoughts 2 3 1   PHQ-9 Score 15 16 12   Difficult doing work/chores - Somewhat difficult -     She has No Known Allergies.   She  has a past medical history of Anxiety; Arthritis; Cervical dysplasia; Complex regional pain syndrome; Depression; and HSV-2 (herpes simplex virus 2) infection.    She  reports that she has  been smoking Cigarettes.  She has been smoking about 0.50 packs per day. She has never used smokeless tobacco. She reports that she drinks alcohol. She reports that she does not use drugs. She  reports that she currently engages in sexual activity. She reports using the following method of birth control/protection: Pill. The patient  has a past surgical history that includes Gum surgery and Cervical biopsy w/ loop electrode excision (03/2010).  Her family history includes Heart attack in her father; Hypertension in her mother; Kidney Stones in her mother.  Review of Systems  Psychiatric/Behavioral: Positive for depression (not new, improved per PHQ9) and suicidal ideas ( not new, improved per PHQ9).    The problem list and medications were reviewed and updated by myself where necessary and exist elsewhere in the encounter.   OBJECTIVE:  BP 108/76 (BP Location: Right Arm, Patient Position: Sitting, Cuff Size: Normal)   Pulse 98   Temp 99.2 F (37.3 C) (Oral)   Resp 16   Ht 5\' 3"  (1.6 m)   Wt 99 lb (44.9 kg)   SpO2 100%   BMI 17.54 kg/m   Physical Exam  Constitutional: She is oriented to person, place, and time. She appears well-developed and well-nourished.  Cardiovascular:  Normal rate, regular rhythm and normal heart sounds.   Pulmonary/Chest: Effort normal and breath sounds normal.  Musculoskeletal: Normal range of motion.  Neurological: She is alert and oriented to person, place, and time. No cranial nerve deficit.  Skin: Skin is warm and dry.  Psychiatric: Her mood appears anxious. Her affect is angry. Her affect is not blunt, not labile and not inappropriate. Her speech is not rapid and/or pressured, not delayed, not tangential and not slurred. She is not agitated, not aggressive, not hyperactive, not slowed, not withdrawn, not actively hallucinating and not combative. Thought content is not paranoid and not delusional. Cognition and memory are not impaired. She does not express  impulsivity or inappropriate judgment. She does not exhibit a depressed mood. She is communicative. She is attentive.    No results found for this or any previous visit (from the past 72 hour(s)).  No results found.  ASSESSMENT AND PLAN  Katie Green was seen today for establish care, medication refill. Advised patient that I will not be prescribing controlled substances chronically to her.  She was established with pain management at one point per Dr. Netta Corrigan advise however she stopped this due to the cost of drug screening.She does however need to be treated for pain given her diagnosis. She reports she can get back in with pain management and will call them back tomorrow. I have advised that pain may refuse to see her if she is continued on ativan given the quadrupling risk for overdose death.  She was not aware of this risk. She is taking ativan three times daily. She is not sure how many she has left at this point.  I advised that she call and let me know how much medication she has left so that I may help taper the dose or prescribe a taper so she can take pain medication.  She was not amenable to this. Advised I would be happy to place a psychiatric referral, and that I am happy to help her with her health in anyway that does not involve chronic controlled substances however she is not amenable to this. She may call for a benzo taper ajnd I am happy to provide this for her however she is only taking lorazepam TID so the taper will should be relatively short and I would plan to do so by less than 25% per week until she is off. Despite spending over thirty minutes with her I did not bill her for today's visit.     I have talked with Dr. Neva Seat about today's visit and he is in agreement of the plan, however he did see that she was complaining of suicidal thoughts.  In review this has improved since her last visit.  I did call the patient back in the presence of Dr. Neva Seat and left a voicemail asking  that she call me back tonight or tomorrow so we can discuss her suicidal thoughts is detail, and whether she needs to be seen emergently by psychiatry.   Chronic, continuous use of opioids: See above.   Chronic prescription benzodiazepine use: See above.   Polypharmacy: See above.     The patient is advised to call or return to clinic if she does not see an improvement in symptoms, or to seek the care of the closest emergency department if she worsens with the above plan.   Deliah Boston, MHS, PA-C [facility] 11/08/2015 6:51 PM    Addendum: Patient already has  a psychiatrist and is receiving adderall for [facility]  Today's diagnosis changed to reflect in [facility]. Deliah BostonMichael Delano Frate, MS, PA-C 9:42 AM, 11/09/2015

## 2015-11-08 NOTE — Progress Notes (Signed)
Patient and visit discussed with Mr. Chestine SporeClark. Agree with concern of chronic opioid use along with benzodiazepine use along with CDC guidelines in avoiding this combination.  Agree with option for benzodiazepine taper, psychiatry referral and may need CBT/counseling especially if tapering benzo, and should either return to prior established pain management or can refer to other facility.

## 2015-11-09 ENCOUNTER — Other Ambulatory Visit: Payer: Self-pay | Admitting: Physician Assistant

## 2015-11-11 ENCOUNTER — Other Ambulatory Visit: Payer: Self-pay | Admitting: Physician Assistant

## 2015-12-10 ENCOUNTER — Telehealth: Payer: Self-pay

## 2015-12-10 MED ORDER — CITALOPRAM HYDROBROMIDE 40 MG PO TABS: 40.0000 mg | ORAL_TABLET | Freq: Every day | ORAL | 1 refills | Status: DC

## 2015-12-10 NOTE — Telephone Encounter (Signed)
Pt called requesting RF of citalopram which pharm had faxed a req for instead of sending electronically. Called pt to advise and LMOM.

## 2015-12-17 ENCOUNTER — Other Ambulatory Visit: Payer: Self-pay | Admitting: Family Medicine

## 2015-12-19 ENCOUNTER — Other Ambulatory Visit: Payer: Self-pay

## 2015-12-19 ENCOUNTER — Other Ambulatory Visit: Payer: Self-pay | Admitting: Family Medicine

## 2015-12-19 NOTE — Telephone Encounter (Signed)
Noted; chart reviewed.  Agree with plan.

## 2015-12-19 NOTE — Telephone Encounter (Signed)
Routing to Dr. Katrinka BlazingSmith.  Per my last encounter with her advised I would not be refilling any controlled substances for her.  She was taking benzodiazepines at that time along with moderately high doses of pain medication.  She plans to establish with chronic pain. Patient also with a prescription of adderall from her psychiatrist (which was not listed in her medication list).

## 2015-12-19 NOTE — Telephone Encounter (Signed)
Patient request a refill of LORazepam (ATIVAN) 1 MG tablet  [id number] - [place], [place] - [address]

## 2015-12-20 NOTE — Telephone Encounter (Signed)
Pt called back and reported that she is no longer taking narcotic pain meds, she went through withdrawal from them when she couldn't get in to Pain Clinic and we would not Rx it. She stated she is not taking Adderall either. She stated that she started a new job this week and does not think she will be off and back in town for even a 5:30 appt. [place] and will be back 10/4. Pt reqs RF of xanax until she can get in to est care with Maralyn SagoSarah who she has talked to on mychart in the past. Pt asked me to send this to Maralyn SagoSarah for review since she is not taking these other controlled meds any longer.

## 2015-12-20 NOTE — Addendum Note (Signed)
Addended by: Sheppard PlumberBRIGGS, Catalaya Garr A on: 12/20/2015 04:21 PM   Modules accepted: Orders

## 2015-12-21 ENCOUNTER — Other Ambulatory Visit: Payer: Self-pay | Admitting: Family Medicine

## 2015-12-21 NOTE — Telephone Encounter (Addendum)
When did the patient stop taking Adderall?

## 2015-12-21 NOTE — Addendum Note (Signed)
Addended by: Morrell RiddleWEBER, Ant[place]an Samford L on: 12/21/2015 02:33 PM   Modules accepted: Orders

## 2015-12-22 ENCOUNTER — Telehealth: Payer: Self-pay

## 2015-12-22 NOTE — Telephone Encounter (Signed)
Raynelle FanningJulie is working on this with Maralyn SagoSarah, there are messages open

## 2015-12-22 NOTE — Telephone Encounter (Signed)
2 week supply written.

## 2015-12-22 NOTE — Telephone Encounter (Signed)
Spoke with Maralyn SagoSarah informed patient she will call in 2 weeks worth of medication for her.  She will need to call Dr. Evelene CroonKaur let us know when appointment is.  We will go from there.   Patient voiced understanding.

## 2015-12-22 NOTE — Telephone Encounter (Signed)
LM for patient to let us know when she stopped taking Adderal.

## 2015-12-22 NOTE — Telephone Encounter (Signed)
Patient called to return call. No documentation of call today. Was informed someone would call back to discuss after lunch today. Thank you! °  °[phone] °

## 2015-12-22 NOTE — Telephone Encounter (Signed)
Patient called to return call. No documentation of call today. Was informed someone would call back to discuss after lunch today. Thank you!  [phone]

## 2016-04-21 ENCOUNTER — Other Ambulatory Visit: Payer: Self-pay | Admitting: Gynecology

## 2016-04-21 ENCOUNTER — Other Ambulatory Visit: Payer: Self-pay | Admitting: Physician Assistant

## 2016-04-23 ENCOUNTER — Telehealth: Payer: Self-pay | Admitting: Physician Assistant

## 2016-04-23 NOTE — Telephone Encounter (Signed)
Authorized voicemail that she should follow up with us in office prior to refill.  She likely should have followed up with pain management, and we should make sure this was done, or return for pcp.  Advised to schedule appt with Benny Lennertsarah weber

## 2016-04-23 NOTE — Telephone Encounter (Signed)
Authorized voicemail that she should follow up with us in office prior to refill.  She likely should have followed up with pain management, and we should make sure this was done, or return for pcp.  Advised to schedule appt with sarah weber  

## 2016-05-16 DIAGNOSIS — L723 Sebaceous cyst: Secondary | ICD-10-CM | POA: Diagnosis not present

## 2016-05-30 ENCOUNTER — Encounter: Payer: BLUE CROSS/BLUE SHIELD | Admitting: Gynecology

## 2016-06-06 ENCOUNTER — Ambulatory Visit (INDEPENDENT_AMBULATORY_CARE_PROVIDER_SITE_OTHER): Payer: 59 | Admitting: Gynecology

## 2016-06-06 ENCOUNTER — Encounter: Payer: Self-pay | Admitting: Gynecology

## 2016-06-06 VITALS — BP 116/72 | Ht 64.0 in | Wt 99.0 lb

## 2016-06-06 DIAGNOSIS — Z13220 Encounter for screening for lipoid disorders: Secondary | ICD-10-CM | POA: Diagnosis not present

## 2016-06-06 DIAGNOSIS — Z01419 Encounter for gynecological examination (general) (routine) without abnormal findings: Secondary | ICD-10-CM | POA: Diagnosis not present

## 2016-06-06 DIAGNOSIS — Z113 Encounter for screening for infections with a predominantly sexual mode of transmission: Secondary | ICD-10-CM | POA: Diagnosis not present

## 2016-06-06 DIAGNOSIS — N898 Other specified noninflammatory disorders of vagina: Secondary | ICD-10-CM

## 2016-06-06 LAB — CBC WITH DIFFERENTIAL/PLATELET
BASOS ABS: 66 {cells}/uL (ref 0–200)
Basophils Relative: 1 %
EOS ABS: 132 {cells}/uL (ref 15–500)
Eosinophils Relative: 2 %
HCT: 33.3 % — ABNORMAL LOW (ref 35.0–45.0)
Hemoglobin: 11.1 g/dL — ABNORMAL LOW (ref 11.7–15.5)
LYMPHS PCT: 54 %
Lymphs Abs: 3564 cells/uL (ref 850–3900)
MCH: 32.2 pg (ref 27.0–33.0)
MCHC: 33.3 g/dL (ref 32.0–36.0)
MCV: 96.5 fL (ref 80.0–100.0)
MONOS PCT: 6 %
MPV: 8.9 fL (ref 7.5–12.5)
Monocytes Absolute: 396 cells/uL (ref 200–950)
Neutro Abs: 2442 cells/uL (ref 1500–7800)
Neutrophils Relative %: 37 %
PLATELETS: 444 10*3/uL — AB (ref 140–400)
RBC: 3.45 MIL/uL — ABNORMAL LOW (ref 3.80–5.10)
RDW: 13.8 % (ref 11.0–15.0)
WBC: 6.6 10*3/uL (ref 3.8–10.8)

## 2016-06-06 LAB — COMPREHENSIVE METABOLIC PANEL
ALT: 6 U/L (ref 6–29)
AST: 12 U/L (ref 10–30)
Albumin: 4.5 g/dL (ref 3.6–5.1)
Alkaline Phosphatase: 29 U/L — ABNORMAL LOW (ref 33–115)
BUN: 9 mg/dL (ref 7–25)
CALCIUM: 8.9 mg/dL (ref 8.6–10.2)
CHLORIDE: 106 mmol/L (ref 98–110)
CO2: 22 mmol/L (ref 20–31)
Creat: 0.89 mg/dL (ref 0.50–1.10)
Glucose, Bld: 82 mg/dL (ref 65–99)
POTASSIUM: 4.1 mmol/L (ref 3.5–5.3)
Sodium: 136 mmol/L (ref 135–146)
TOTAL PROTEIN: 6.7 g/dL (ref 6.1–8.1)
Total Bilirubin: 0.3 mg/dL (ref 0.2–1.2)

## 2016-06-06 LAB — LIPID PANEL
CHOL/HDL RATIO: 2.8 ratio (ref <5.0)
Cholesterol: 166 mg/dL (ref <200)
HDL: 60 mg/dL (ref >50)
LDL CALC: 78 mg/dL (ref <100)
TRIGLYCERIDES: 141 mg/dL (ref <150)
VLDL: 28 mg/dL (ref <30)

## 2016-06-06 LAB — WET PREP FOR TRICH, YEAST, CLUE
Trich, Wet Prep: NONE SEEN
YEAST WET PREP: NONE SEEN

## 2016-06-06 MED ORDER — METRONIDAZOLE 500 MG PO TABS: 500.0000 mg | ORAL_TABLET | Freq: Two times a day (BID) | ORAL | 0 refills | Status: DC

## 2016-06-06 MED ORDER — NORETHINDRONE ACET-ETHINYL EST 1-20 MG-MCG PO TABS: 1.0000 | ORAL_TABLET | Freq: Every day | ORAL | 4 refills | Status: DC

## 2016-06-06 NOTE — Progress Notes (Signed)
    Katie MainsMelissa A Green [birth year] [id number]        31 y.o.  G0P0 for annual exam.   Past medical history,surgical history, problem list, medications, allergies, family history and social history were all reviewed and documented as reviewed in the EPIC chart.  ROS:  Performed with pertinent positives and negatives included in the history, assessment and plan.   Additional significant findings :  None   Exam: Kennon PortelaKim Gardner assistant Vitals:   06/06/16 1357  BP: 116/72  Weight: 99 lb (44.9 kg)  Height: 5\' 4"  (1.626 m)   Body mass index is 16.99 kg/m.  General appearance:  Normal affect, orientation and appearance. Skin: Grossly normal HEENT: Without gross lesions.  No cervical or supraclavicular adenopathy. Thyroid normal.  Lungs:  Clear without wheezing, rales or rhonchi Cardiac: RR, without RMG Abdominal:  Soft, nontender, without masses, guarding, rebound, organomegaly or hernia Breasts:  Examined lying and sitting without masses, retractions, discharge or axillary adenopathy. Pelvic:  Ext, BUS, Vagina: With heavy white discharge  Cervix: Normal GC/Chlamydia done  Uterus: Anteverted, normal size, shape and contour, midline and mobile nontender   Adnexa: Without masses or tenderness    Anus and perineum: Normal     Assessment/Plan:  31 y.o. G0P0 female for annual exam regular menses, oral contraceptives..   1. Loestrin 1/20 equivalent. Doing well and wants to continue. Increased risk of thrombosis with age and smoking discussed. Will need to consider alternative birth control you have moved through her 30s. Refill 1 year provided. 2. Heavy white discharge. Wet prep consistent with bacterial vaginosis. Patient without symptoms. Issues of treatment versus nontreatment if she does not really notice any symptoms discussed. Will cover with Flagyl 500 mg twice a day 7 days. Alcohol avoidance reviewed. 3. History of HSV-2. Uses Valtrex intermittently with outbreaks which are rare. Has  supply at home but will call if she needs more. 4. Pap smear 2017. No Pap smear done today. History of LEEP with high-grade dysplasia, free margins 2012. Normal Pap smear/negative HPV 2015. Normal Pap smear 2016 and 2017 5. STD screening. GC/Chlamydia done. No known exposure wants to be screened. Declined serum screening. 6. Stop smoking again reviewed. 7. Breast health. SBE monthly reviewed. Plan screening mammogram at age 31. 8. Health maintenance. Baseline CBC, CMP, lipid profile and urinalysis ordered. Follow up in one year, sooner as needed.   Dara LordsFONTAINE,Raza Bayless P MD, 2:17 PM 06/06/2016

## 2016-06-06 NOTE — Patient Instructions (Signed)
Take the metronidazole antibiotic twice daily for 7 days. Avoid alcohol while taking.   Steps to Quit Smoking Smoking tobacco can be harmful to your health and can affect almost every organ in your body. Smoking puts you, and those around you, at risk for developing many serious chronic diseases. Quitting smoking is difficult, but it is one of the best things that you can do for your health. It is never too late to quit. What are the benefits of quitting smoking? When you quit smoking, you lower your risk of developing serious diseases and conditions, such as:  Lung cancer or lung disease, such as COPD.  Heart disease.  Stroke.  Heart attack.  Infertility.  Osteoporosis and bone fractures. Additionally, symptoms such as coughing, wheezing, and shortness of breath may get better when you quit. You may also find that you get sick less often because your body is stronger at fighting off colds and infections. If you are pregnant, quitting smoking can help to reduce your chances of having a baby of low birth weight. How do I get ready to quit? When you decide to quit smoking, create a plan to make sure that you are successful. Before you quit:  Pick a date to quit. Set a date within the next two weeks to give you time to prepare.  Write down the reasons why you are quitting. Keep this list in places where you will see it often, such as on your bathroom mirror or in your car or wallet.  Identify the people, places, things, and activities that make you want to smoke (triggers) and avoid them. Make sure to take these actions:  Throw away all cigarettes at home, at work, and in your car.  Throw away smoking accessories, such as Set designerashtrays and lighters.  Clean your car and make sure to empty the ashtray.  Clean your home, including curtains and carpets.  Tell your family, friends, and coworkers that you are quitting. Support from your loved ones can make quitting easier.  Talk with your  health care provider about your options for quitting smoking.  Find out what treatment options are covered by your health insurance. What strategies can I use to quit smoking? Talk with your healthcare provider about different strategies to quit smoking. Some strategies include:  Quitting smoking altogether instead of gradually lessening how much you smoke over a period of time. Research shows that quitting "cold [place]key" is more successful than gradually quitting.  Attending in-person counseling to help you build problem-solving skills. You are more likely to have success in quitting if you attend several counseling sessions. Even short sessions of 10 minutes can be effective.  Finding resources and support systems that can help you to quit smoking and remain smoke-free after you quit. These resources are most helpful when you use them often. They can include:  Online chats with a Veterinary surgeoncounselor.  Telephone quitlines.  Printed Materials engineerself-help materials.  Support groups or group counseling.  Text messaging programs.  Mobile phone applications.  Taking medicines to help you quit smoking. (If you are pregnant or breastfeeding, talk with your health care provider first.) Some medicines contain nicotine and some do not. Both types of medicines help with cravings, but the medicines that include nicotine help to relieve withdrawal symptoms. Your health care provider may recommend:  Nicotine patches, gum, or lozenges.  Nicotine inhalers or sprays.  Non-nicotine medicine that is taken by mouth. Talk with your health care provider about combining strategies, such as taking medicines  while you are also receiving in-person counseling. Using these two strategies together makes you more likely to succeed in quitting than if you used either strategy on its own. If you are pregnant or breastfeeding, talk with your health care provider about finding counseling or other support strategies to quit smoking. Do not  take medicine to help you quit smoking unless told to do so by your health care provider. What things can I do to make it easier to quit? Quitting smoking might feel overwhelming at first, but there is a lot that you can do to make it easier. Take these important actions:  Reach out to your family and friends and ask that they support and encourage you during this time. Call telephone quitlines, reach out to support groups, or work with a counselor for support.  Ask people who smoke to avoid smoking around you.  Avoid places that trigger you to smoke, such as bars, parties, or smoke-break areas at work.  Spend time around people who do not smoke.  Lessen stress in your life, because stress can be a smoking trigger for some people. To lessen stress, try:  Exercising regularly.  Deep-breathing exercises.  Yoga.  Meditating.  Performing a body scan. This involves closing your eyes, scanning your body from head to toe, and noticing which parts of your body are particularly tense. Purposefully relax the muscles in those areas.  Download or purchase mobile phone or tablet apps (applications) that can help you stick to your quit plan by providing reminders, tips, and encouragement. There are many free apps, such as QuitGuide from the [employer] developer for Disease Control and Prevention). You can find other support for quitting smoking (smoking cessation) through smokefree.gov and other websites. How will I feel when I quit smoking? Within the first 24 hours of quitting smoking, you may start to feel some withdrawal symptoms. These symptoms are usually most noticeable 2-3 days after quitting, but they usually do not last beyond 2-3 weeks. Changes or symptoms that you might experience include:  Mood swings.  Restlessness, anxiety, or irritation.  Difficulty concentrating.  Dizziness.  Strong cravings for sugary foods in addition to nicotine.  Mild weight  gain.  Constipation.  Nausea.  Coughing or a sore throat.  Changes in how your medicines work in your body.  A depressed mood.  Difficulty sleeping (insomnia). After the first 2-3 weeks of quitting, you may start to notice more positive results, such as:  Improved sense of smell and taste.  Decreased coughing and sore throat.  Slower heart rate.  Lower blood pressure.  Clearer skin.  The ability to breathe more easily.  Fewer sick days. Quitting smoking is very challenging for most people. Do not get discouraged if you are not successful the first time. Some people need to make many attempts to quit before they achieve long-term success. Do your best to stick to your quit plan, and talk with your health care provider if you have any questions or concerns. This information is not intended to replace advice given to you by your health care provider. Make sure you discuss any questions you have with your health care provider. Document Released: 03/11/2001 Document Revised: 11/13/2015 Document Reviewed: 08/01/2014 Elsevier Interactive Patient Education  2017 [employer].

## 2016-06-07 LAB — URINALYSIS W MICROSCOPIC + REFLEX CULTURE
Bacteria, UA: NONE SEEN [HPF]
Bilirubin Urine: NEGATIVE
Casts: NONE SEEN [LPF]
Crystals: NONE SEEN [HPF]
GLUCOSE, UA: NEGATIVE
Ketones, ur: NEGATIVE
LEUKOCYTES UA: NEGATIVE
NITRITE: NEGATIVE
PH: 6 (ref 5.0–8.0)
Protein, ur: NEGATIVE
SPECIFIC GRAVITY, URINE: 1.019 (ref 1.001–1.035)
YEAST: NONE SEEN [HPF]

## 2016-06-08 LAB — URINE CULTURE

## 2016-06-09 LAB — GC/CHLAMYDIA PROBE AMP
CT PROBE, AMP APTIMA: NOT DETECTED
GC PROBE AMP APTIMA: NOT DETECTED

## 2016-07-29 DIAGNOSIS — N200 Calculus of kidney: Secondary | ICD-10-CM

## 2016-07-29 HISTORY — DX: Calculus of kidney: N20.0

## 2016-08-06 DIAGNOSIS — R310 Gross hematuria: Secondary | ICD-10-CM | POA: Diagnosis not present

## 2016-08-06 DIAGNOSIS — N200 Calculus of kidney: Secondary | ICD-10-CM | POA: Diagnosis not present

## 2016-08-06 DIAGNOSIS — R350 Frequency of micturition: Secondary | ICD-10-CM | POA: Diagnosis not present

## 2016-08-08 DIAGNOSIS — N200 Calculus of kidney: Secondary | ICD-10-CM | POA: Diagnosis not present

## 2016-08-12 DIAGNOSIS — N200 Calculus of kidney: Secondary | ICD-10-CM | POA: Diagnosis not present

## 2016-08-13 ENCOUNTER — Encounter: Payer: Self-pay | Admitting: Gynecology

## 2016-08-22 DIAGNOSIS — N200 Calculus of kidney: Secondary | ICD-10-CM | POA: Diagnosis not present

## 2016-08-28 DIAGNOSIS — D500 Iron deficiency anemia secondary to blood loss (chronic): Secondary | ICD-10-CM | POA: Diagnosis not present

## 2016-08-28 DIAGNOSIS — R109 Unspecified abdominal pain: Secondary | ICD-10-CM | POA: Diagnosis not present

## 2016-09-01 DIAGNOSIS — F419 Anxiety disorder, unspecified: Secondary | ICD-10-CM | POA: Insufficient documentation

## 2016-09-01 DIAGNOSIS — N200 Calculus of kidney: Secondary | ICD-10-CM | POA: Diagnosis not present

## 2016-09-03 DIAGNOSIS — Z0389 Encounter for observation for other suspected diseases and conditions ruled out: Secondary | ICD-10-CM | POA: Diagnosis not present

## 2016-09-03 DIAGNOSIS — N200 Calculus of kidney: Secondary | ICD-10-CM | POA: Diagnosis not present

## 2016-09-03 DIAGNOSIS — N135 Crossing vessel and stricture of ureter without hydronephrosis: Secondary | ICD-10-CM | POA: Diagnosis not present

## 2016-09-09 DIAGNOSIS — T191XXA Foreign body in bladder, initial encounter: Secondary | ICD-10-CM | POA: Diagnosis not present

## 2016-09-26 DIAGNOSIS — N200 Calculus of kidney: Secondary | ICD-10-CM | POA: Diagnosis not present

## 2016-09-29 ENCOUNTER — Other Ambulatory Visit: Payer: Self-pay

## 2016-09-29 MED ORDER — NORETHINDRONE ACET-ETHINYL EST 1-20 MG-MCG PO TABS: 1.0000 | ORAL_TABLET | Freq: Every day | ORAL | 2 refills | Status: DC

## 2016-09-30 DIAGNOSIS — N200 Calculus of kidney: Secondary | ICD-10-CM | POA: Diagnosis not present

## 2016-10-05 ENCOUNTER — Encounter: Payer: Self-pay | Admitting: Gynecology

## 2016-10-06 DIAGNOSIS — N200 Calculus of kidney: Secondary | ICD-10-CM | POA: Diagnosis not present

## 2016-10-06 NOTE — Telephone Encounter (Signed)
Visit okay

## 2016-10-07 ENCOUNTER — Encounter: Payer: Self-pay | Admitting: Gynecology

## 2016-10-07 ENCOUNTER — Ambulatory Visit (INDEPENDENT_AMBULATORY_CARE_PROVIDER_SITE_OTHER): Payer: 59 | Admitting: Gynecology

## 2016-10-07 VITALS — BP 116/74

## 2016-10-07 DIAGNOSIS — N3001 Acute cystitis with hematuria: Secondary | ICD-10-CM | POA: Diagnosis not present

## 2016-10-07 DIAGNOSIS — R102 Pelvic and perineal pain: Secondary | ICD-10-CM

## 2016-10-07 DIAGNOSIS — N200 Calculus of kidney: Secondary | ICD-10-CM

## 2016-10-07 DIAGNOSIS — N898 Other specified noninflammatory disorders of vagina: Secondary | ICD-10-CM | POA: Diagnosis not present

## 2016-10-07 LAB — URINALYSIS W MICROSCOPIC + REFLEX CULTURE
BILIRUBIN URINE: NEGATIVE
CASTS: NONE SEEN [LPF]
CRYSTALS: NONE SEEN [HPF]
Glucose, UA: NEGATIVE
NITRITE: POSITIVE — AB
SPECIFIC GRAVITY, URINE: 1.025 (ref 1.001–1.035)
Yeast: NONE SEEN [HPF]
pH: 5.5 (ref 5.0–8.0)

## 2016-10-07 LAB — WET PREP FOR TRICH, YEAST, CLUE: TRICH WET PREP: NONE SEEN

## 2016-10-07 LAB — PREGNANCY, URINE: PREG TEST UR: NEGATIVE

## 2016-10-07 MED ORDER — CIPROFLOXACIN HCL 250 MG PO TABS: 250.0000 mg | ORAL_TABLET | Freq: Two times a day (BID) | ORAL | 0 refills | Status: DC

## 2016-10-07 MED ORDER — VALACYCLOVIR HCL 500 MG PO TABS: 500.0000 mg | ORAL_TABLET | Freq: Two times a day (BID) | ORAL | 12 refills | Status: DC

## 2016-10-07 MED ORDER — FLUCONAZOLE 150 MG PO TABS: 150.0000 mg | ORAL_TABLET | Freq: Once | ORAL | 0 refills | Status: DC

## 2016-10-07 MED ORDER — FLUCONAZOLE 150 MG PO TABS: 150.0000 mg | ORAL_TABLET | Freq: Once | ORAL | 0 refills | Status: AC

## 2016-10-07 NOTE — Patient Instructions (Signed)
Take the ciprofloxacin twice daily for 10 days. Call your urologist to let him know what's going on and to follow up as far as your kidney stones. Take the Diflucan pill daily for 5 days to treat the vaginal yeast infection Take the Valtrex twice daily for 10 days to treat the possible cervical herpes outbreak.  Office will call with the culture results.

## 2016-10-07 NOTE — Addendum Note (Signed)
Addended by: Dayna BarkerGARDNER, Sharesa Kemp K on: 10/07/2016 12:02 PM   Modules accepted: Orders

## 2016-10-07 NOTE — Progress Notes (Signed)
    Carleene MainsMelissa A Erler [birth year] [id number]        31 y.o.  G0P0 presents complaining of vaginal pain and irritation as well as dysuria. The patient has been dealing with recurrent renal lithiasis since the beginning of June. She has multiple stones that she has passed some spontaneously. She's had bilateral ureteral stents placed them removed in the interim. Has been treated with antibiotics although has been off of them now for over a week. Notes significant dysuria now and having some blood in her urine. Notes vaginal pain and discomfort that is not usual for her. No significant discharge or odor. No fever or chills or significant low back pain. No nausea vomiting diarrhea constipation. 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  Denies pregnancy possibilities.  Past medical history,surgical history, problem list, medications, allergies, family history and social history were all reviewed and documented in the EPIC chart.  Directed ROS with pertinent positives and negatives documented in the history of present illness/assessment and plan.  Exam: Kennon PortelaKim Gardner assistant Vitals:   10/07/16 1114  BP: 116/74   General appearance:  Normal Spine straight without CVA tenderness Abdomen soft no tenderness masses guarding rebound Pelvic external BUS vagina with white discharge. Cervix with inflammatory appearance questionable ulceration. Uterus normal size midline mobile nontender. Adnexa without masses or tenderness.  Assessment/Plan:  31 y.o. G0P0 with history of recurrent and persistent renal lithiasis. She is actively being followed by urology with consideration for lithotripsy. Urinalysis consistent with UTI now. Will cover with ciprofloxacin 250 mg 10 days. She'll follow up with her urologist in the interim. Cervix with questionable ulcerations and history of HSV in the past. HSV screen done over these areas. We'll start on Valtrex 500 mg twice a day 10 days also.  Wet prep does show yeast and few clue cells. Will cover for yeast vaginitis with Diflucan 150 mg daily 5 days to be more aggressive given her antibiotic history treatment. We'll also check GC/chlamydia screen. Follow up if vaginal symptoms persist. Follow up with urology in reference to her urinary renal lithiasis and symptoms.    Dara LordsFONTAINE,Abbagail Scaff P MD, 11:28 AM 10/07/2016

## 2016-10-07 NOTE — Addendum Note (Signed)
Addended by: Rushie GoltzSPANGLER, Nina Hoar on: 10/07/2016 12:45 PM   Modules accepted: Orders

## 2016-10-07 NOTE — Addendum Note (Signed)
Addended by: Dayna BarkerGARDNER, KIMBERLY K on: 10/07/2016 12:53 PM   Modules accepted: Orders

## 2016-10-07 NOTE — Addendum Note (Signed)
Addended by: Dara LordsFONTAINE, TIMOTHY P on: 10/07/2016 12:06 PM   Modules accepted: Orders

## 2016-10-08 LAB — GC/CHLAMYDIA PROBE AMP
CT PROBE, AMP APTIMA: NOT DETECTED
GC PROBE AMP APTIMA: NOT DETECTED

## 2016-10-09 LAB — SURESWAB HSV, TYPE 1/2 DNA, PCR
HSV 1 DNA: NOT DETECTED
HSV 2 DNA: NOT DETECTED

## 2016-10-09 LAB — URINE CULTURE: ORGANISM ID, BACTERIA: NO GROWTH

## 2016-10-13 ENCOUNTER — Encounter: Payer: Self-pay | Admitting: *Deleted

## 2016-10-21 DIAGNOSIS — N200 Calculus of kidney: Secondary | ICD-10-CM | POA: Diagnosis not present

## 2016-10-23 ENCOUNTER — Emergency Department (HOSPITAL_COMMUNITY): Payer: 59

## 2016-10-23 ENCOUNTER — Encounter (HOSPITAL_COMMUNITY): Payer: Self-pay | Admitting: Emergency Medicine

## 2016-10-23 ENCOUNTER — Emergency Department (HOSPITAL_COMMUNITY)
Admission: EM | Admit: 2016-10-23 | Discharge: 2016-10-23 | Disposition: A | Discharge status: Home / Self Care | Payer: 59 | Attending: Emergency Medicine | Admitting: Emergency Medicine

## 2016-10-23 DIAGNOSIS — F17210 Nicotine dependence, cigarettes, uncomplicated: Secondary | ICD-10-CM | POA: Insufficient documentation

## 2016-10-23 DIAGNOSIS — R102 Pelvic and perineal pain: Secondary | ICD-10-CM | POA: Diagnosis not present

## 2016-10-23 DIAGNOSIS — Z87442 Personal history of urinary calculi: Secondary | ICD-10-CM | POA: Diagnosis not present

## 2016-10-23 DIAGNOSIS — D500 Iron deficiency anemia secondary to blood loss (chronic): Secondary | ICD-10-CM | POA: Insufficient documentation

## 2016-10-23 DIAGNOSIS — R319 Hematuria, unspecified: Secondary | ICD-10-CM | POA: Insufficient documentation

## 2016-10-23 DIAGNOSIS — Z79899 Other long term (current) drug therapy: Secondary | ICD-10-CM | POA: Diagnosis not present

## 2016-10-23 DIAGNOSIS — R2242 Localized swelling, mass and lump, left lower limb: Secondary | ICD-10-CM | POA: Diagnosis present

## 2016-10-23 DIAGNOSIS — R1032 Left lower quadrant pain: Secondary | ICD-10-CM | POA: Diagnosis not present

## 2016-10-23 DIAGNOSIS — R109 Unspecified abdominal pain: Secondary | ICD-10-CM

## 2016-10-23 DIAGNOSIS — N200 Calculus of kidney: Secondary | ICD-10-CM | POA: Diagnosis not present

## 2016-10-23 HISTORY — DX: Calculus of kidney: N20.0

## 2016-10-23 LAB — BASIC METABOLIC PANEL
ANION GAP: 8 (ref 5–15)
BUN: 23 mg/dL — ABNORMAL HIGH (ref 6–20)
CHLORIDE: 107 mmol/L (ref 101–111)
CO2: 22 mmol/L (ref 22–32)
Calcium: 8.8 mg/dL — ABNORMAL LOW (ref 8.9–10.3)
Creatinine, Ser: 1.16 mg/dL — ABNORMAL HIGH (ref 0.44–1.00)
GFR calc non Af Amer: >60 mL/min (ref >60)
GLUCOSE: 98 mg/dL (ref 65–99)
POTASSIUM: 3.6 mmol/L (ref 3.5–5.1)
Sodium: 137 mmol/L (ref 135–145)

## 2016-10-23 LAB — URINALYSIS, ROUTINE W REFLEX MICROSCOPIC
BILIRUBIN URINE: NEGATIVE
Glucose, UA: NEGATIVE mg/dL
Ketones, ur: NEGATIVE mg/dL
NITRITE: NEGATIVE
PH: 6 (ref 5.0–8.0)
Protein, ur: 100 mg/dL — AB
SPECIFIC GRAVITY, URINE: 1.017 (ref 1.005–1.030)

## 2016-10-23 LAB — CBC WITH DIFFERENTIAL/PLATELET
BASOS PCT: 1 %
Basophils Absolute: 0.1 10*3/uL (ref 0.0–0.1)
Eosinophils Absolute: 0.8 10*3/uL — ABNORMAL HIGH (ref 0.0–0.7)
Eosinophils Relative: 7 %
HEMATOCRIT: 22.1 % — AB (ref 36.0–46.0)
HEMOGLOBIN: 7.6 g/dL — AB (ref 12.0–15.0)
LYMPHS ABS: 3.1 10*3/uL (ref 0.7–4.0)
LYMPHS PCT: 27 %
MCH: 32.1 pg (ref 26.0–34.0)
MCHC: 34.4 g/dL (ref 30.0–36.0)
MCV: 93.2 fL (ref 78.0–100.0)
MONO ABS: 0.8 10*3/uL (ref 0.1–1.0)
MONOS PCT: 7 %
NEUTROS ABS: 6.8 10*3/uL (ref 1.7–7.7)
NEUTROS PCT: 58 %
Platelets: 471 10*3/uL — ABNORMAL HIGH (ref 150–400)
RBC: 2.37 MIL/uL — ABNORMAL LOW (ref 3.87–5.11)
RDW: 15.2 % (ref 11.5–15.5)
WBC: 11.5 10*3/uL — ABNORMAL HIGH (ref 4.0–10.5)

## 2016-10-23 LAB — PREGNANCY, URINE: PREG TEST UR: NEGATIVE

## 2016-10-23 MED ORDER — SODIUM CHLORIDE 0.9 % IV BOLUS (SEPSIS)
1000.0000 mL | Freq: Once | INTRAVENOUS | Status: AC
  Administered 2016-10-23: 1000 mL via INTRAVENOUS

## 2016-10-23 MED ORDER — IOPAMIDOL (ISOVUE-300) INJECTION 61%
INTRAVENOUS | Status: AC
  Filled 2016-10-23: qty 100

## 2016-10-23 MED ORDER — HYDROCODONE-ACETAMINOPHEN 5-325 MG PO TABS: 1.0000 | ORAL_TABLET | ORAL | 0 refills | Status: DC | PRN

## 2016-10-23 MED ORDER — FENTANYL CITRATE (PF) 100 MCG/2ML IJ SOLN
50.0000 ug | Freq: Once | INTRAMUSCULAR | Status: AC
  Administered 2016-10-23: 50 ug via INTRAVENOUS
  Filled 2016-10-23: qty 2

## 2016-10-23 MED ORDER — IOPAMIDOL (ISOVUE-300) INJECTION 61%
80.0000 mL | Freq: Once | INTRAVENOUS | Status: AC | PRN
  Administered 2016-10-23: 80 mL via INTRAVENOUS

## 2016-10-23 NOTE — ED Notes (Signed)
Patient transported to CT 

## 2016-10-23 NOTE — ED Provider Notes (Signed)
[facility]-EMERGENCY DEPT Provider Note   CSN: [id number] Arrival date & time: 10/23/16  1724     History   Chief Complaint Chief Complaint  Patient presents with  . Vaginal Pain  . Leg Swelling    HPI Katie Green is a 31 y.o. female.  Patient is a 31 year old female who presents with left-sided vaginal pain. She's had recurrent kidney stones since May. She's followed by a urologist in [facility]. She previously had bilateral stone manipulation with urethral stents placed on June 6. She subsequently had the stents removed. She states she's had ongoing pain to the left pelvic area since that time. She has persistent hematuria with clots since that time. She has recently seen her OB/GYN for the symptoms on July 16 and had a complete pelvic evaluation. She was tested for HSV that was negative. Her other pelvic labs were negative. She didn't have any significant discharge. She had a urinalysis at that time which was suspicious for infection and took a 10 day course of Cipro. However her urine culture was negative for growth. She has not followed up with her urologist as she does not want to go back to that urologist. She has noticed some slight swelling to her lower extremities over the last several days. She denies any shortness of breath. No dizziness. No chest pain. She does feel little more tired than normal.      Past Medical History:  Diagnosis Date  . Anxiety   . Arthritis   . Cervical dysplasia    LEEP for high grade 03/2010 margins clear, lgsil pap/C&B 09/2010, ASCUS neg HR HPV pap 04/2011  . Complex regional pain syndrome    Left foot  . Depression   . HSV-2 (herpes simplex virus 2) infection   . Kidney stones     Patient Active Problem List   Diagnosis Date Noted  . Complex regional pain syndrome 07/22/2012  . Depression, reactive 10/01/2011  . Nicotine dependence 07/02/2011  . Foot pain, left 07/01/2011  . 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 Cervical dysplasia   .  HSV-2 (herpes simplex virus 2) infection   . Anxiety     Past Surgical History:  Procedure Laterality Date  . CERVICAL BIOPSY  W/ LOOP ELECTRODE EXCISION  03/2010   high grade with free margins  . GUM SURGERY      OB History    Gravida Para Term Preterm AB Living   0             SAB TAB Ectopic Multiple Live Births                   Home Medications    Prior to Admission medications   Medication Sig Start Date End Date Taking? Authorizing Provider  amphetamine-dextroamphetamine (ADDERALL) 20 MG tablet TAKE 1 TABLET BY MOUTH EVERY MORNING, AT NOON, AND AT 4PM 10/20/16  Yes [provider]  citalopram (CELEXA) 40 MG tablet Take 1 tablet (40 mg total) by mouth daily. 12/10/15  Yes Ofilia Neas, PA-C  ferrous sulfate 325 (65 FE) MG EC tablet Take 325 mg by mouth daily with supper.    Yes [provider]  LORazepam (ATIVAN) 1 MG tablet TAKE 1 TABLET BY MOUTH EVERY 8 HOURS AS NEEDED Patient taking differently: TAKE 1 TABLET BY MOUTH EVERY 8 HOURS AS NEEDED FOR ANXIETY 12/22/15  Yes Weber, Dema Severin, PA-C  norethindrone-ethinyl estradiol (JUNEL 1/20) 1-20 MG-MCG tablet Take 1 tablet by mouth daily. 09/29/16  Yes Fontaine, Nadyne Coombes, MD  traMADol-acetaminophen (ULTRACET) 37.5-325 MG tablet TAKE 2 TABLETS BY MOUTH EVERY 6 HOURS AS NEEDED FOR ANXIETY UP TO 5 DAYS (**DO NOT EXCEED 8 TABS/24HRS**) 10/21/16  Yes [provider]  vitamin B-12 (CYANOCOBALAMIN) 1000 MCG tablet Take 1,000 mcg by mouth daily.    Yes [provider]  ciprofloxacin (CIPRO) 250 MG tablet Take 1 tablet (250 mg total) by mouth 2 (two) times daily. For 10 days Patient not taking: Reported on 10/23/2016 10/07/16   Fontaine, Nadyne Coombes, MD  HYDROcodone-acetaminophen (NORCO/VICODIN) 5-325 MG tablet Take 1-2 tablets by mouth every 4 (four) hours as needed. 10/23/16   Rolan Bucco, MD  metroNIDAZOLE (FLAGYL) 500 MG tablet Take 1 tablet (500 mg total) by mouth 2 (two) times daily. For 7 days.   Avoid alcohol while taking Patient not taking: Reported on 10/07/2016 06/06/16   Fontaine, Nadyne Coombes, MD  Oxycodone HCl 10 MG TABS q 4-6h prn Patient not taking: Reported on 06/06/2016 11/02/15   Valarie Cones, Dema Severin, PA-C  valACYclovir (VALTREX) 500 MG tablet Take 1 tablet (500 mg total) by mouth 2 (two) times daily. Patient not taking: Reported on 10/23/2016 10/07/16   Fontaine, Nadyne Coombes, MD    Family History Family History  Problem Relation Age of Onset  . Hypertension Mother   . Kidney Stones Mother   . Heart attack Father     Social History Social History  Substance Use Topics  . Smoking status: Current Every Day Smoker    Packs/day: 0.50    Types: Cigarettes  . Smokeless tobacco: Never Used  . Alcohol use 0.0 oz/week     Comment: rare     Allergies   Patient has no known allergies.   Review of Systems Review of Systems  Constitutional: Positive for fatigue. Negative for chills, diaphoresis and fever.  HENT: Negative for congestion, rhinorrhea and sneezing.   Eyes: Negative.   Respiratory: Negative for cough, chest tightness and shortness of breath.   Cardiovascular: Positive for leg swelling. Negative for chest pain.  Gastrointestinal: Positive for abdominal pain. Negative for blood in stool, diarrhea, nausea and vomiting.  Genitourinary: Positive for pelvic pain. Negative for difficulty urinating, flank pain, frequency and hematuria.  Musculoskeletal: Negative for arthralgias and back pain.  Skin: Negative for rash.  Neurological: Negative for dizziness, speech difficulty, weakness, numbness and headaches.     Physical Exam Updated Vital Signs BP 103/73 (BP Location: Right Arm)   Pulse 80   Temp 98.6 F (37 C) (Oral)   Resp 12   Ht 5\' 3"  (1.6 m)   Wt 46.3 kg (102 lb)   SpO2 99%   BMI 18.07 kg/m   Physical Exam  Constitutional: She is oriented to person, place, and time. She appears well-developed and well-nourished.  HENT:  Head: Normocephalic and atraumatic.    Eyes: Pupils are equal, round, and reactive to light.  Neck: Normal range of motion. Neck supple.  Cardiovascular: Normal rate, regular rhythm and normal heart sounds.   Pulmonary/Chest: Effort normal and breath sounds normal. No respiratory distress. She has no wheezes. She has no rales. She exhibits no tenderness.  Abdominal: Soft. Bowel sounds are normal. There is tenderness (Positive mild tenderness to the left lower abdomen). There is no rebound and no guarding.  Musculoskeletal: Normal range of motion. She exhibits no edema.  Lymphadenopathy:    She has no cervical adenopathy.  Neurological: She is alert and oriented to person, place, and time.  Skin: Skin is  warm and dry. No rash noted.  Psychiatric: She has a normal mood and affect.     ED Treatments / Results  Labs (all labs ordered are listed, but only abnormal results are displayed) Labs Reviewed  BASIC METABOLIC PANEL - Abnormal; Notable for the following:       Result Value   BUN 23 (*)    Creatinine, Ser 1.16 (*)    Calcium 8.8 (*)    All other components within normal limits  CBC WITH DIFFERENTIAL/PLATELET - Abnormal; Notable for the following:    WBC 11.5 (*)    RBC 2.37 (*)    Hemoglobin 7.6 (*)    HCT 22.1 (*)    Platelets 471 (*)    Eosinophils Absolute 0.8 (*)    All other components within normal limits  URINALYSIS, ROUTINE W REFLEX MICROSCOPIC - Abnormal; Notable for the following:    APPearance HAZY (*)    Hgb urine dipstick LARGE (*)    Protein, ur 100 (*)    Leukocytes, UA MODERATE (*)    Bacteria, UA MANY (*)    Squamous Epithelial / LPF 0-5 (*)    All other components within normal limits  URINE CULTURE  PREGNANCY, URINE    EKG  EKG Interpretation None       Radiology US Transvaginal Non-ob  Result Date: 10/23/2016 CLINICAL DATA:  Left pelvic pain EXAM: TRANSABDOMINAL AND TRANSVAGINAL ULTRASOUND OF PELVIS DOPPLER ULTRASOUND OF OVARIES TECHNIQUE: Both transabdominal and transvaginal  ultrasound examinations of the pelvis were performed. Transabdominal technique was performed for global imaging of the pelvis including uterus, ovaries, adnexal regions, and pelvic cul-de-sac. It was necessary to proceed with endovaginal exam following the transabdominal exam to visualize the bilateral ovaries. Color and duplex Doppler ultrasound was utilized to evaluate blood flow to the ovaries. COMPARISON:  None. FINDINGS: Uterus Measurements: 6.4 x 2.5 x 2.9 cm. No fibroids or other mass visualized. Endometrium Thickness: 4.8 mm.  No focal abnormality visualized. Right ovary Measurements: 3.1 x 1.6 x 1.7 cm. There is a simple cyst measuring 1.8 x 1.4 x 1.2 cm. Left ovary Measurements: 2.4 x 1.4 x 1.2 cm. There is a complex cyst measuring 1 x 0.9 x 0.9 cm. Pulsed Doppler evaluation of both ovaries demonstrates normal low-resistance arterial and venous waveforms. Other findings No abnormal free fluid. IMPRESSION: No evidence of ovarian torsion. 1 cm complex cyst in the left ovary which could represent hemorrhagic cyst. Electronically Signed   By: Sherian Rein M.D.   On: 10/23/2016 19:08   US Pelvis Complete  Result Date: 10/23/2016 CLINICAL DATA:  Left pelvic pain EXAM: TRANSABDOMINAL AND TRANSVAGINAL ULTRASOUND OF PELVIS DOPPLER ULTRASOUND OF OVARIES TECHNIQUE: Both transabdominal and transvaginal ultrasound examinations of the pelvis were performed. Transabdominal technique was performed for global imaging of the pelvis including uterus, ovaries, adnexal regions, and pelvic cul-de-sac. It was necessary to proceed with endovaginal exam following the transabdominal exam to visualize the bilateral ovaries. Color and duplex Doppler ultrasound was utilized to evaluate blood flow to the ovaries. COMPARISON:  None. FINDINGS: Uterus Measurements: 6.4 x 2.5 x 2.9 cm. No fibroids or other mass visualized. Endometrium Thickness: 4.8 mm.  No focal abnormality visualized. Right ovary Measurements: 3.1 x 1.6 x 1.7 cm.  There is a simple cyst measuring 1.8 x 1.4 x 1.2 cm. Left ovary Measurements: 2.4 x 1.4 x 1.2 cm. There is a complex cyst measuring 1 x 0.9 x 0.9 cm. Pulsed Doppler evaluation of both ovaries demonstrates normal low-resistance arterial and  venous waveforms. Other findings No abnormal free fluid. IMPRESSION: No evidence of ovarian torsion. 1 cm complex cyst in the left ovary which could represent hemorrhagic cyst. Electronically Signed   By: Sherian Rein M.D.   On: 10/23/2016 19:08   Ct Abdomen Pelvis W Contrast  Result Date: 10/23/2016 CLINICAL DATA:  Vaginal pain and bleeding for over 2 months. New lower extremity swelling today. EXAM: CT ABDOMEN AND PELVIS WITH CONTRAST TECHNIQUE: Multidetector CT imaging of the abdomen and pelvis was performed using the standard protocol following bolus administration of intravenous contrast. CONTRAST:  80mL ISOVUE-300 IOPAMIDOL (ISOVUE-300) INJECTION 61% COMPARISON:  10/23/2016 at 20:26 FINDINGS: Lower chest: No acute abnormality. Hepatobiliary: No focal liver abnormality is seen. No gallstones, gallbladder wall thickening, or biliary dilatation. Pancreas: Unremarkable. No pancreatic ductal dilatation or surrounding inflammatory changes. Spleen: Normal in size without focal abnormality. Adrenals/Urinary Tract: Both adrenals are normal. Two right collecting system calculi are present, nonobstructing, each measuring 2 mm. No significant renal parenchymal lesions. Ureters and urinary bladder are normal. Delayed imaging is obtained, with good contrast opacification of both ureters. The ureters are intact. Stomach/Bowel: Stomach is within normal limits. Appendix is normal. No evidence of bowel wall thickening, distention, or inflammatory changes. Vascular/Lymphatic: No significant vascular findings are present. No enlarged abdominal or pelvic lymph nodes. Reproductive: Uterus and bilateral adnexa are unremarkable. Other: No focal inflammation.  No ascites. Musculoskeletal: No  significant skeletal lesion. IMPRESSION: Both ureters are intact on delayed imaging. Right nephrolithiasis, nonobstructing. No acute findings. Electronically Signed   By: Ellery Plunk M.D.   On: 10/23/2016 21:58   US Renal  Result Date: 10/23/2016 CLINICAL DATA:  Hematuria and left flank pain EXAM: RENAL / URINARY TRACT ULTRASOUND COMPLETE COMPARISON:  None. FINDINGS: Right Kidney: Length: 10 cm. Several echogenic foci are identified within the right kidney largest measures 6.8 mm. No mass or hydronephrosis visualized. Left Kidney: Length: 9.4 cm. Several echogenic foci are identified within the left kidney largest measures 5.1 mm. There is question dilatation of the proximal left ureter. Bladder: The bladder is decompressed limiting evaluation. IMPRESSION: Bilateral nonobstructing stones identified within the kidneys. There is questioned dilatation of the proximal left ureter, hydronephrosis is not excluded. Electronically Signed   By: Sherian Rein M.D.   On: 10/23/2016 19:10   [place] Art/ven Flow Abd Pelv Doppler  Result Date: 10/23/2016 CLINICAL DATA:  Left pelvic pain EXAM: TRANSABDOMINAL AND TRANSVAGINAL ULTRASOUND OF PELVIS DOPPLER ULTRASOUND OF OVARIES TECHNIQUE: Both transabdominal and transvaginal ultrasound examinations of the pelvis were performed. Transabdominal technique was performed for global imaging of the pelvis including uterus, ovaries, adnexal regions, and pelvic cul-de-sac. It was necessary to proceed with endovaginal exam following the transabdominal exam to visualize the bilateral ovaries. Color and duplex Doppler ultrasound was utilized to evaluate blood flow to the ovaries. COMPARISON:  None. FINDINGS: Uterus Measurements: 6.4 x 2.5 x 2.9 cm. No fibroids or other mass visualized. Endometrium Thickness: 4.8 mm.  No focal abnormality visualized. Right ovary Measurements: 3.1 x 1.6 x 1.7 cm. There is a simple cyst measuring 1.8 x 1.4 x 1.2 cm. Left ovary Measurements: 2.4 x 1.4 x 1.2  cm. There is a complex cyst measuring 1 x 0.9 x 0.9 cm. Pulsed Doppler evaluation of both ovaries demonstrates normal low-resistance arterial and venous waveforms. Other findings No abnormal free fluid. IMPRESSION: No evidence of ovarian torsion. 1 cm complex cyst in the left ovary which could represent hemorrhagic cyst. Electronically Signed   By: Gabriel Carina.D.  On: 10/23/2016 19:08   Ct Renal Stone Study  Result Date: 10/23/2016 CLINICAL DATA:  Recurrent vaginal pain since May. EXAM: CT ABDOMEN AND PELVIS WITHOUT CONTRAST TECHNIQUE: Multidetector CT imaging of the abdomen and pelvis was performed following the standard protocol without IV contrast. COMPARISON:  None. FINDINGS: Lower chest: No acute abnormality. Hepatobiliary: No focal liver abnormality is seen. The liver is enlarged measuring 18 cm in length. No gallstones, gallbladder wall thickening, or biliary dilatation. Pancreas: Unremarkable. No pancreatic ductal dilatation or surrounding inflammatory changes. Spleen: Normal in size without focal abnormality. Adrenals/Urinary Tract: The adrenal glands are normal. There are two 2 mm nonobstructing stones in the midpole right kidney. There is no hydronephrosis bilaterally. The bladder is normal. Stomach/Bowel: Stomach is within normal limits. The appendix is not definitely seen. No evidence of bowel wall thickening, distention, or inflammatory changes. Vascular/Lymphatic: No significant vascular findings are present. No enlarged abdominal or pelvic lymph nodes. Reproductive: Uterus and bilateral adnexa are unremarkable. Other: No abdominal wall hernia or abnormality. No abdominopelvic ascites. Musculoskeletal: No acute or significant osseous findings. IMPRESSION: No acute abnormality identified in the abdomen and pelvis. Small nonobstructing stones within the right kidney. No hydronephrosis bilaterally. Electronically Signed   By: Sherian ReinWei-Chen  Lin M.D.   On: 10/23/2016 20:35    Procedures Procedures  (including critical care time)  Medications Ordered in ED Medications  iopamidol (ISOVUE-300) 61 % injection (not administered)  fentaNYL (SUBLIMAZE) injection 50 mcg (50 mcg Intravenous Given 10/23/16 1818)  sodium chloride 0.9 % bolus 1,000 mL (1,000 mLs Intravenous New Bag/Given 10/23/16 2021)  fentaNYL (SUBLIMAZE) injection 50 mcg (50 mcg Intravenous Given 10/23/16 2036)  iopamidol (ISOVUE-300) 61 % injection 80 mL (80 mLs Intravenous Contrast Given 10/23/16 2125)     Initial Impression / Assessment and Plan / ED Course  I have reviewed the triage vital signs and the nursing notes.  Pertinent labs & imaging results that were available during my care of the patient were reviewed by me and considered in my medical decision making (see chart for details).     Patient presents with a 2 month history of persistent left pelvic pain after being treated for recurrent kidney stones. She's had ongoing hematuria as well. She just had an extensive pelvic exam by her OB/GYN so I did not repeat that today. She denies any vaginal discharge or vaginal bleeding. She denies any blood in her stool. She does have persistent hematuria. Pelvic ultrasound shows a left ovarian cyst but otherwise unremarkable. Renal ultrasound showed suspicion for hydronephrosis. A CT scan was performed which showed no evidence of ureteral stones. Her hemoglobin is low at 7.6. Her most recent hemoglobin on June 4 was 11.2. I was concerned about the significant drop. I consulted urology and spoke with Dr. Merrily PewFilippou.  She felt that we could do a CT with contrast in the ureters to assess for ureteral injury. This was done which showed no evidence of acute injury to the ureters. The neurologist felt the patient can be discharged home to follow-up as an outpatient. This point patient has fatigue but no other symptoms associated with her anemia. She's not tachycardic. She is not hypotensive. I felt that she can be discharged home with close  follow-up. She was encouraged to take her iron supplements daily. She also was encouraged to use a stool softener she did have constipation CT. She was given a prescription for a small amount of Vicodin for pain. She wants a referral to a new urologist and I gave  her referral to [facility]. I did advise her to follow-up with her OB/GYN within the next 2-3 days for recheck on her hemoglobin. She was given strict return precautions. Her urine showed some mild signs of infection as well as hematuria. He was not grossly bloody. Given that she just finished a ten-day course of antibiotics, I did not restart her on antibiotics but did send a culture.  Final Clinical Impressions(s) / ED Diagnoses   Final diagnoses:  Hematuria, unspecified type  Iron deficiency anemia due to chronic blood loss  Acute left flank pain    New Prescriptions New Prescriptions   HYDROCODONE-ACETAMINOPHEN (NORCO/VICODIN) 5-325 MG TABLET    Take 1-2 tablets by mouth every 4 (four) hours as needed.     Rolan BuccoBelfi, Jervon Ream, MD 10/23/16 2259

## 2016-10-23 NOTE — ED Triage Notes (Signed)
Pt comes in with vaginal pain that has been recurrent since the beginning of May. Hx of kidney stones with stent placement.  Pt states she still passes some clots while urinating and has the vaginal pain.  Has been checked by OBGYN and had several tests ran that were unremarkable.  Pt endorses leg swelling today that is new. Has been on cipro and diflucan for yeast infections and UTI's over the past 6 weeks.

## 2016-10-23 NOTE — ED Notes (Signed)
Pt ambulatory to bathroom

## 2016-10-25 LAB — URINE CULTURE

## 2016-10-29 ENCOUNTER — Telehealth: Payer: Self-pay | Admitting: Behavioral Health

## 2016-10-29 NOTE — Telephone Encounter (Signed)
Unable to reach patient at time of Pre-Visit Call.  Left message for patient to return call when available.    

## 2016-10-30 ENCOUNTER — Encounter: Payer: Self-pay | Admitting: Family Medicine

## 2016-10-30 ENCOUNTER — Ambulatory Visit (INDEPENDENT_AMBULATORY_CARE_PROVIDER_SITE_OTHER): Payer: 59 | Admitting: Family Medicine

## 2016-10-30 VITALS — BP 112/46 | HR 107 | Temp 98.6°F | Ht 59.75 in | Wt 95.6 lb

## 2016-10-30 DIAGNOSIS — D649 Anemia, unspecified: Secondary | ICD-10-CM | POA: Diagnosis not present

## 2016-10-30 DIAGNOSIS — R300 Dysuria: Secondary | ICD-10-CM | POA: Diagnosis not present

## 2016-10-30 MED ORDER — PREDNISONE 20 MG PO TABS: 40.0000 mg | ORAL_TABLET | Freq: Every day | ORAL | 0 refills | Status: AC

## 2016-10-30 NOTE — Patient Instructions (Signed)
Give us 3-4 business days to get the results of your labs back.   Call your GYN doctor for an appointment. Something I would consider is pelvic floor dysfunction.  OK to take Tylenol 1000 mg (2 extra strength tabs) or 975 mg (3 regular strength tabs) every 6 hours as needed.  Avoid anything with NSAIDs on it.

## 2016-10-30 NOTE — Progress Notes (Signed)
Chief Complaint  Patient presents with  . Establish Care    Pt seen in ED low HGB.       New Patient Visit SUBJECTIVE: HPI: Katie Green is an 31 y.o.female who is being seen for establishing care.  She is here with her mother. She has had issues with kidney stones and hematuria. She was recently seen in the emergency department and found to have a hemoglobin of 7.6. This was markedly changed from a recent reading of 11.6. She is not having periods routinely. She denies any vaginal bleeding, rectal bleeding, or any other areas of easy bruising/bleeding. No personal or family history of bleeding disorders. She does take anti-inflammatories regularly due to 3 months of pelvic pain. She has undergone rounds of antibiotics without relief. She is very displeased with her current urologist and is working to get set up with [facility]. The earliest appointment she can get is at the end of the month. She has a GYN who she sees. The last time he saw her, there was concerns for infection. She has pain when she urinates and around her vagina as well.  Of note, when she went to the emergency department, she did have some swelling in her lower extremity. This has since resolved.  No Known Allergies  Past Medical History:  Diagnosis Date  . Anemia   . Anxiety   . Arthritis   . Cervical dysplasia    LEEP for high grade 03/2010 margins clear, lgsil pap/C&B 09/2010, ASCUS neg HR HPV pap 04/2011  . Complex regional pain syndrome    Left foot  . Depression   . HSV-2 (herpes simplex virus 2) infection   . Kidney stones   . Nephrolithiasis 07/29/2016   Confirmed multiple stones via CT scan   Past Surgical History:  Procedure Laterality Date  . CERVICAL BIOPSY  W/ LOOP ELECTRODE EXCISION  03/2010   high grade with free margins  . GUM SURGERY     Social History   Social History  . Marital status: Single   Social History Main Topics  . Smoking status: Current Every Day Smoker   Packs/day: 0.50    Years: 17.00    Types: Cigarettes  . Smokeless tobacco: Never Used  . Alcohol use 0.0 oz/week     Comment: rare  . Drug use: No  . Sexual activity: Yes    Birth control/ protection: Pill     Comment: 1st intercourse 31 yo-More than 5 partners   Family History  Problem Relation Age of Onset  . Hypertension Mother   . Kidney Stones Mother   . Hyperlipidemia Mother   . Heart attack Father   . Hyperlipidemia Maternal Grandmother   . Hypertension Maternal Grandmother   . Fibromyalgia Maternal Grandmother   . Dementia Maternal Grandmother   . Hypertension Maternal Grandfather   . Hyperlipidemia Maternal Grandfather   . Aneurysm Maternal Grandfather   . Deep vein thrombosis Maternal Grandfather   . Emphysema Paternal Grandmother        Smoker  . Stroke Paternal Grandmother   . Heart disease Paternal Grandfather   . Hyperlipidemia Paternal Grandfather   . CAD Paternal Grandfather   . Emphysema Paternal Grandfather   . COPD Paternal Grandfather        Quit smoking 30-years-ago  . Aneurysm Paternal Grandfather   . Arthritis Paternal Grandfather      Current Outpatient Prescriptions:  .  amphetamine-dextroamphetamine (ADDERALL) 20 MG tablet, TAKE 1 TABLET BY MOUTH EVERY  MORNING, AT NOON, AND AT 4PM, Disp: , Rfl: 0 .  citalopram (CELEXA) 40 MG tablet, Take 1 tablet (40 mg total) by mouth daily., Disp: 90 tablet, Rfl: 1 .  ferrous sulfate 325 (65 FE) MG EC tablet, Take 325 mg by mouth daily with supper. , Disp: , Rfl:  .  HYDROcodone-acetaminophen (NORCO/VICODIN) 5-325 MG tablet, Take 1-2 tablets by mouth every 4 (four) hours as needed., Disp: 15 tablet, Rfl: 0 .  norethindrone-ethinyl estradiol (JUNEL 1/20) 1-20 MG-MCG tablet, Take 1 tablet by mouth daily., Disp: 63 tablet, Rfl: 2 .  vitamin B-12 (CYANOCOBALAMIN) 1000 MCG tablet, Take 1,000 mcg by mouth daily. , Disp: , Rfl:  .  ALPRAZolam (XANAX) 1 MG tablet, Take 1 tablet by mouth 3 (three) times daily as  needed., Disp: , Rfl: 4 .  predniSONE (DELTASONE) 20 MG tablet, Take 2 tablets (40 mg total) by mouth daily with breakfast., Disp: 14 tablet, Rfl: 0  No LMP recorded. Patient is not currently having periods (Reason: Oral contraceptives).  ROS Heme: Denies areas of easy bleeding  GU: As noted in HPI   OBJECTIVE: BP (!) 112/46 (BP Location: Right Arm, Patient Position: Sitting, Cuff Size: Normal)   Pulse (!) 107   Temp 98.6 F (37 C) (Oral)   Ht 4' 11.75" (1.518 m)   Wt 95 lb 9.6 oz (43.4 kg)   SpO2 98%   BMI 18.83 kg/m   Constitutional: -  VS reviewed -  Well developed, well nourished, appears stated age -  No apparent distress  Psychiatric: -  Oriented to person, place, and time -  Memory intact -  Affect and mood normal -  Fluent conversation, good eye contact -  Judgment and insight age appropriate  Eye: -  Conjunctivae clear, no discharge -  Pupils symmetric, round, reactive to light  ENMT: -  Oral mucosa without lesions, tongue and uvula midline    Tonsils not enlarged, no erythema, no exudate, trachea midline    Pharynx moist, no lesions, no erythema  Neck: -  No gross swelling, no palpable masses -  Thyroid midline, not enlarged, mobile, no palpable masses  Cardiovascular: -  RRR, no murmurs -  No LE edema  Respiratory: -  Normal respiratory effort, no accessory muscle use, no retraction -  Breath sounds equal, no wheezes, no ronchi, no crackles  Gastrointestinal: -  Bowel sounds normal -  Diffuse TTP, no distention, no guarding, no masses  Skin: -  No significant lesion on inspection -  Warm and dry to palpation   ASSESSMENT/PLAN: Anemia, unspecified type - Plan: CBC, Ferritin, IBC panel, Retic, Folate, B12, Haptoglobin, Lactate Dehydrogenase  Dysuria - Plan: predniSONE (DELTASONE) 20 MG tablet  Orders as above. Biggest concern and reason for her establishing with us is for anemia. Avoid anti-inflammatories. I will call in prednisone, mainly because I would  also like to see if she responds well to this with her dysuria. Given the swelling she had, Reiter's arthritis comes to mind. I would like her to see her gynecologist for evaluation for possible pelvic floor dysfunction. I will leave the blood in her urine to her future urologist. Patient should return prn. The patient and her mother voiced understanding and agreement to the plan.   Jilda Rocheicholas Paul RiverenoWendling, DO 10/30/16  5:15 PM

## 2016-10-31 LAB — CBC
HCT: 26.8 % — ABNORMAL LOW (ref 36.0–46.0)
Hemoglobin: 8.8 g/dL — ABNORMAL LOW (ref 12.0–15.0)
MCHC: 33 g/dL (ref 30.0–36.0)
MCV: 98.2 fl (ref 78.0–100.0)
Platelets: 541 10*3/uL — ABNORMAL HIGH (ref 150.0–400.0)
RBC: 2.73 Mil/uL — ABNORMAL LOW (ref 3.87–5.11)
RDW: 14.9 % (ref 11.5–15.5)
WBC: 8.9 10*3/uL (ref 4.0–10.5)

## 2016-10-31 LAB — FERRITIN: FERRITIN: 17.6 ng/mL (ref 10.0–291.0)

## 2016-10-31 LAB — IBC PANEL
IRON: 62 ug/dL (ref 42–145)
Saturation Ratios: 10.3 % — ABNORMAL LOW (ref 20.0–50.0)
Transferrin: 431 mg/dL — ABNORMAL HIGH (ref 212.0–360.0)

## 2016-10-31 LAB — FOLATE: FOLATE: 8 ng/mL (ref >5.9)

## 2016-10-31 LAB — VITAMIN B12: VITAMIN B 12: 287 pg/mL (ref 211–911)

## 2016-10-31 LAB — HAPTOGLOBIN: HAPTOGLOBIN: 284 mg/dL — AB (ref 43–212)

## 2016-10-31 LAB — RETICULOCYTES
ABS Retic: 44640 cells/uL (ref 20000–80000)
RBC.: 2.79 MIL/uL — AB (ref 3.80–5.10)
Retic Ct Pct: 1.6 %

## 2016-10-31 LAB — LACTATE DEHYDROGENASE: LDH: 104 U/L (ref 100–200)

## 2016-11-03 ENCOUNTER — Encounter: Payer: Self-pay | Admitting: Family Medicine

## 2016-11-04 ENCOUNTER — Ambulatory Visit (INDEPENDENT_AMBULATORY_CARE_PROVIDER_SITE_OTHER): Payer: 59 | Admitting: Gynecology

## 2016-11-04 ENCOUNTER — Encounter: Payer: Self-pay | Admitting: Gynecology

## 2016-11-04 VITALS — BP 130/82

## 2016-11-04 DIAGNOSIS — N200 Calculus of kidney: Secondary | ICD-10-CM | POA: Diagnosis not present

## 2016-11-04 DIAGNOSIS — R102 Pelvic and perineal pain: Secondary | ICD-10-CM | POA: Diagnosis not present

## 2016-11-04 NOTE — Patient Instructions (Addendum)
Follow up with urology in reference to your pain. Continue to follow up with her primary physician in reference to your anemia

## 2016-11-04 NOTE — Progress Notes (Signed)
Katie Green Aug 24, 1985 [id number]        31 y.o.  G0P0 presents in follow up of her left-sided pelvic pain. Patient relates doing well until earlier this year when she had the onset of significant pain and discovery of renal lithiasis. She did require bilateral stents which then were removed. She's had recurrent bouts of pain most recently over the last week or so leading to emergency room evaluation. Was also found to have significant anemia with a hemoglobin in the 7 range. Initial blood work points towards iron deficient anemia. She is having frank hematuria intermittently. Menses are reported nonexistent on low-dose oral contraceptives.  She is frustrated with the recurrent pain particularly the left sided vaginal pain that she feels in the pelvis which occurs spontaneously unprovoked with intercourse or manipulation. She was seen in this office July planning of pain and a lot of irritation. She was found to have a UTI treated with ciprofloxacin and vaginitis with wet prep showing yeast treated with Diflucan 150 mg 5 days. She did have inflammatory changes of her cervix and subsequent HSV screen was negative as was GC/committee a screen.  She does note that the irritation she was having resolved with treatment. CT scan 10/23/2016 showed normal reproductive organs with nonobstructing right renal calculi.  Ultrasound 10/23/2016 code normal-appearing uterus with endometrial echo 4.8 mm right ovary with small simple cyst 1.8 x 1.4 x 1.2 cm and left ovary with small complex cyst 1 x 0.9 x 0.9 cm. Cul-de-sac was negative.  Past medical history,surgical history, problem list, medications, allergies, family history and social history were all reviewed and documented in the EPIC chart.  Directed ROS with pertinent positives and negatives documented in the history of present illness/assessment and plan.  Exam: [name] assistant Vitals:   11/04/16 1125  BP: 130/82   General appearance:   Normal Spine straight without CVA tenderness Abdomen soft nontender without masses guarding rebound Pelvic external BUS vagina normal. Cervix grossly normal. Uterus normal size midline mobile nontender. Adnexa without masses or tenderness. Rectal exam is normal. She did have tenderness when palpating along the left levator muscle.   Assessment/Plan:  31 y.o. G0P0 with history as above. She is tender along the left leave a muscle on palpation but I doubt this is the true source of her pain which appears to be spontaneous in nature to a level of 8 out of 10 during her ER visit. CT scan and ultrasound both were negative for acute pathology. She did have 2 small renal lithiasis in the right kidney. She continues to have intermittent bouts of hematuria. At this point they're attributing her anemia to this and actively treating her with iron and she has follow up with her primary physician in reference to this. She notes that her menses are nonexistent on the low-dose oral contraceptives and I do not think this is contributing obviously to the anemia.  She has no overt evidence of GYN etiology for her pain given a normal pelvic exam accepting the tenderness at the levator area.  I discussed possible physical therapy options for levator muscle issues but at this point given the degree of her pain I think she needs to have her urologic issues resolved particular with a hematuria before moving towards physical therapy. She is in the process now of arranging an appointment in follow up with the urologist and I encouraged her actively pursue this.  Greater than 50% of my time was spent in direct face  to face counseling and coordination of care with the patient.     Dara LordsFONTAINE,Romey Cohea P MD, 12:04 PM 11/04/2016

## 2016-11-07 DIAGNOSIS — N200 Calculus of kidney: Secondary | ICD-10-CM | POA: Diagnosis not present

## 2016-11-07 DIAGNOSIS — R310 Gross hematuria: Secondary | ICD-10-CM | POA: Diagnosis not present

## 2016-11-07 DIAGNOSIS — R350 Frequency of micturition: Secondary | ICD-10-CM | POA: Diagnosis not present

## 2016-11-07 DIAGNOSIS — R109 Unspecified abdominal pain: Secondary | ICD-10-CM | POA: Diagnosis not present

## 2016-11-07 DIAGNOSIS — M545 Low back pain: Secondary | ICD-10-CM | POA: Diagnosis not present

## 2016-11-08 DIAGNOSIS — N879 Dysplasia of cervix uteri, unspecified: Secondary | ICD-10-CM | POA: Insufficient documentation

## 2017-01-13 DIAGNOSIS — Z23 Encounter for immunization: Secondary | ICD-10-CM | POA: Diagnosis not present

## 2017-01-23 DIAGNOSIS — H6121 Impacted cerumen, right ear: Secondary | ICD-10-CM | POA: Diagnosis not present

## 2017-04-29 ENCOUNTER — Other Ambulatory Visit: Payer: Self-pay | Admitting: Gynecology

## 2017-06-19 ENCOUNTER — Encounter: Payer: Self-pay | Admitting: Gynecology

## 2017-06-19 ENCOUNTER — Ambulatory Visit (INDEPENDENT_AMBULATORY_CARE_PROVIDER_SITE_OTHER): Payer: 59 | Admitting: Gynecology

## 2017-06-19 VITALS — BP 130/80 | Ht 63.0 in | Wt 96.0 lb

## 2017-06-19 DIAGNOSIS — Z01419 Encounter for gynecological examination (general) (routine) without abnormal findings: Secondary | ICD-10-CM | POA: Diagnosis not present

## 2017-06-19 DIAGNOSIS — Z113 Encounter for screening for infections with a predominantly sexual mode of transmission: Secondary | ICD-10-CM

## 2017-06-19 DIAGNOSIS — D649 Anemia, unspecified: Secondary | ICD-10-CM | POA: Diagnosis not present

## 2017-06-19 MED ORDER — NORETHINDRONE ACET-ETHINYL EST 1-20 MG-MCG PO TABS: 1.0000 | ORAL_TABLET | Freq: Every day | ORAL | 4 refills | Status: DC

## 2017-06-19 NOTE — Progress Notes (Signed)
    Katie MainsMelissa A Snelgrove 1985-04-30 [id number]        32 y.o.  G0P0 for annual gynecologic exam.  Doing well from a gynecologic standpoint.  Past medical history,surgical history, problem list, medications, allergies, family history and social history were all reviewed and documented as reviewed in the EPIC chart.  ROS:  Performed with pertinent positives and negatives included in the history, assessment and plan.   Additional significant findings : None   Exam: Kennon PortelaKim Gardner assistant Vitals:   06/19/17 1432  BP: 130/80  Weight: 96 lb (43.5 kg)  Height: 5\' 3"  (1.6 m)   Body mass index is 17.01 kg/m.  General appearance:  Normal affect, orientation and appearance. Skin: Grossly normal HEENT: Without gross lesions.  No cervical or supraclavicular adenopathy. Thyroid normal.  Lungs:  Clear without wheezing, rales or rhonchi Cardiac: RR, without RMG Abdominal:  Soft, nontender, without masses, guarding, rebound, organomegaly or hernia Breasts:  Examined lying and sitting without masses, retractions, discharge or axillary adenopathy. Pelvic:  Ext, BUS, Vagina: Normal  Cervix: Normal.  GC/Chlamydia  Uterus: Anteverted, normal size, shape and contour, midline and mobile nontender   Adnexa: Without masses or tenderness    Anus and perineum: Normal   Rectovaginal: Normal sphincter tone without palpated masses or tenderness.    Assessment/Plan:  32 y.o. G0P0 female for annual gynecologic exam without menses, oral contraceptives.   1. Loestrin 1/20 equivalent.  Doing well and wants to continue.  Having scant to no menses with these.  I again reviewed her smoking and advancing age with increased risk of thrombosis to include stroke heart attack DVT.  Will refill her prescription this year but asked her to start thinking about alternatives such as IUD. 2. Breast health.  Breast exam normal today.  Plan baseline mammography at 38. 3. Pap smear 2017.  No Pap smear done today.  History of LEEP  with high-grade dysplasia 2012.  Normal Pap smear/HPV 2015 and subsequent normal Pap smears 2016 and 17.  Plan repeat Pap smear next year. 4. STD screening requested.  No known exposure but wants to be screened.  GC/Chlamydia, HIV, RPR, hepatitis B and hepatitis C ordered. 5. Stop smoking again discussed and encouraged. 6. Health maintenance.  No routine lab work drawn as patient has primary physician for follow-up.  She had been evaluated for pain previously and this was all attributable to her renal lithiasis which has cleared.  She did have significant anemia with a hemoglobin of 7 that was attributed to a hemorrhagic cystitis in July.  We will go ahead and recheck CBC now.  Otherwise she will follow-up in 1 year, sooner as needed.   Dara Lordsimothy P Sam Wunschel MD, 3:08 PM 06/19/2017

## 2017-06-19 NOTE — Patient Instructions (Signed)
Follow-up in 1 year for annual exam, sooner if any issues. 

## 2017-06-20 LAB — C. TRACHOMATIS/N. GONORRHOEAE RNA
C. TRACHOMATIS RNA, TMA: NOT DETECTED
N. gonorrhoeae RNA, TMA: NOT DETECTED

## 2017-06-22 ENCOUNTER — Encounter: Payer: Self-pay | Admitting: Gynecology

## 2017-06-22 LAB — CBC WITH DIFFERENTIAL/PLATELET
BASOS ABS: 40 {cells}/uL (ref 0–200)
Basophils Relative: 0.6 %
Eosinophils Absolute: 53 cells/uL (ref 15–500)
Eosinophils Relative: 0.8 %
HCT: 34.5 % — ABNORMAL LOW (ref 35.0–45.0)
HEMOGLOBIN: 12.1 g/dL (ref 11.7–15.5)
Lymphs Abs: 2693 cells/uL (ref 850–3900)
MCH: 33.2 pg — AB (ref 27.0–33.0)
MCHC: 35.1 g/dL (ref 32.0–36.0)
MCV: 94.5 fL (ref 80.0–100.0)
MONOS PCT: 5.6 %
MPV: 9.4 fL (ref 7.5–12.5)
NEUTROS ABS: 3445 {cells}/uL (ref 1500–7800)
Neutrophils Relative %: 52.2 %
PLATELETS: 364 10*3/uL (ref 140–400)
RBC: 3.65 10*6/uL — ABNORMAL LOW (ref 3.80–5.10)
RDW: 12.4 % (ref 11.0–15.0)
Total Lymphocyte: 40.8 %
WBC mixed population: 370 cells/uL (ref 200–950)
WBC: 6.6 10*3/uL (ref 3.8–10.8)

## 2017-06-22 LAB — RPR: RPR: NONREACTIVE

## 2017-06-22 LAB — HEPATITIS C ANTIBODY
Hepatitis C Ab: NONREACTIVE
SIGNAL TO CUT-OFF: 0.01 (ref <1.00)

## 2017-06-22 LAB — HIV ANTIBODY (ROUTINE TESTING W REFLEX): HIV 1&2 Ab, 4th Generation: NONREACTIVE

## 2017-06-22 LAB — HEPATITIS B SURFACE ANTIGEN: Hepatitis B Surface Ag: NONREACTIVE

## 2017-08-25 DIAGNOSIS — J3489 Other specified disorders of nose and nasal sinuses: Secondary | ICD-10-CM | POA: Diagnosis not present

## 2017-08-27 DIAGNOSIS — L509 Urticaria, unspecified: Secondary | ICD-10-CM | POA: Diagnosis not present

## 2017-08-27 DIAGNOSIS — J3489 Other specified disorders of nose and nasal sinuses: Secondary | ICD-10-CM | POA: Diagnosis not present

## 2017-08-30 DIAGNOSIS — H5789 Other specified disorders of eye and adnexa: Secondary | ICD-10-CM | POA: Diagnosis not present

## 2017-11-04 IMAGING — US US ART/VEN ABD/PELV/SCROTUM DOPPLER LTD
1 series · 13 of 25 positions shown · non-contrast
Comparison: None.

CLINICAL DATA: Left pelvic pain

EXAM:
TRANSABDOMINAL AND TRANSVAGINAL ULTRASOUND OF PELVIS
DOPPLER ULTRASOUND OF OVARIES
TECHNIQUE: Both transabdominal and transvaginal ultrasound examinations of the
pelvis were performed. Transabdominal technique was performed for
global imaging of the pelvis including uterus, ovaries, adnexal
regions, and pelvic cul-de-sac.
It was necessary to proceed with endovaginal exam following the
transabdominal exam to visualize the bilateral ovaries. Color and
duplex Doppler ultrasound was utilized to evaluate blood flow to the
ovaries.

[Series 1: us art/ven abd/pelv/scrotum doppler ltd · 0.24mm/px · 13 of 66 slices shown]
[im 1/66]
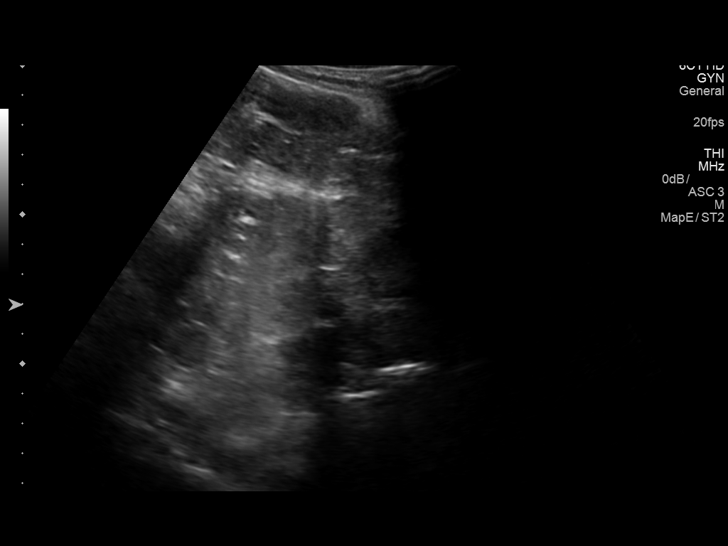
[im 6/66]
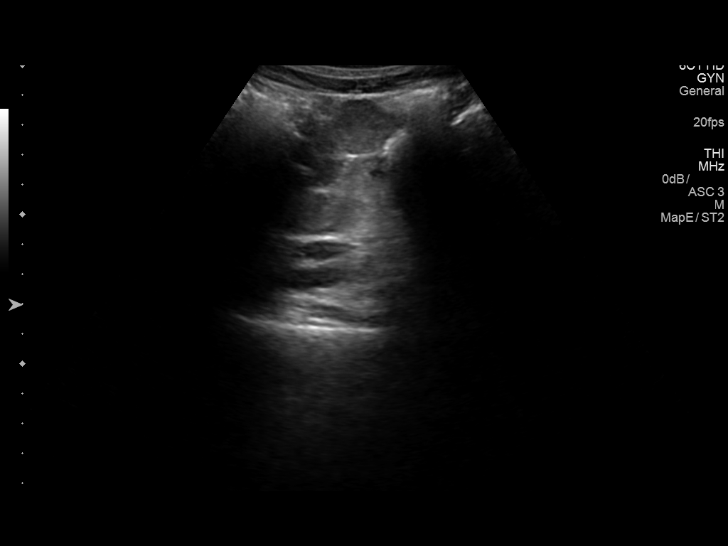
[im 11/66]
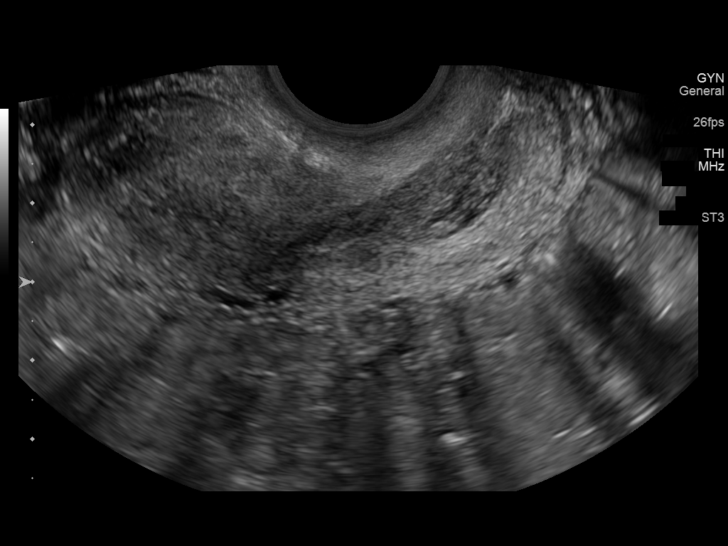
[im 17/66]
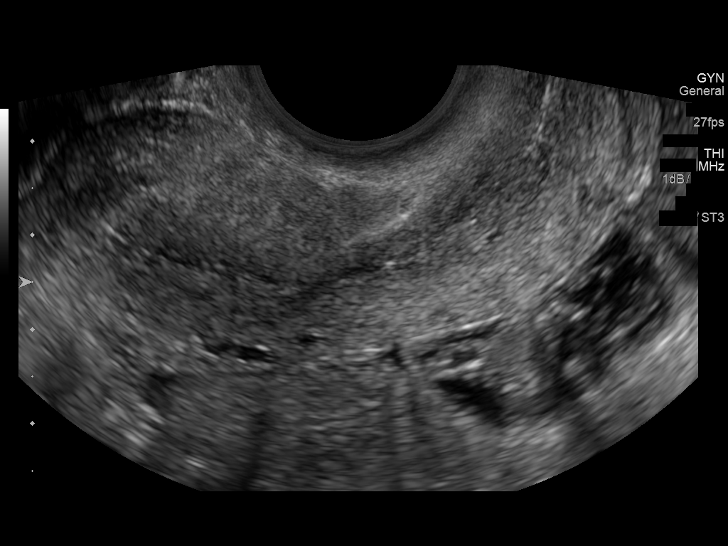
[im 22/66]
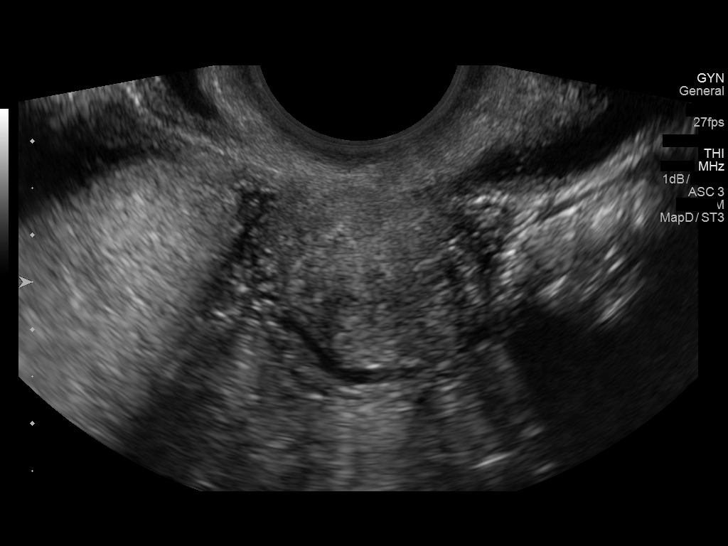
[im 28/66]
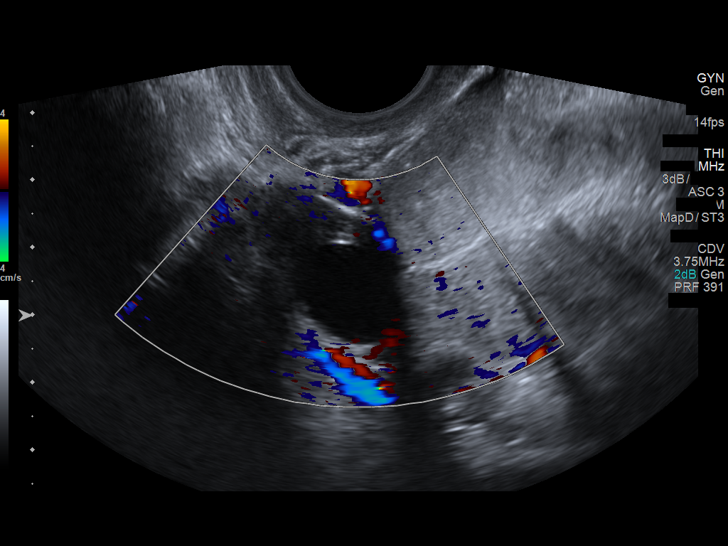
[im 33/66]
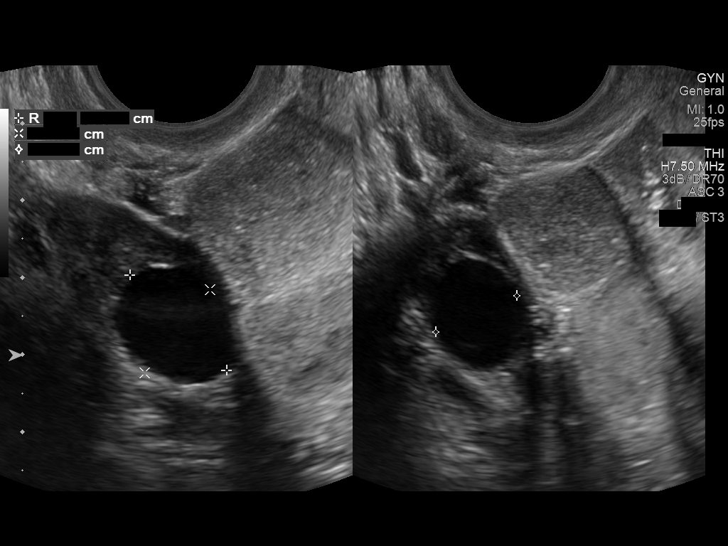
[im 38/66]
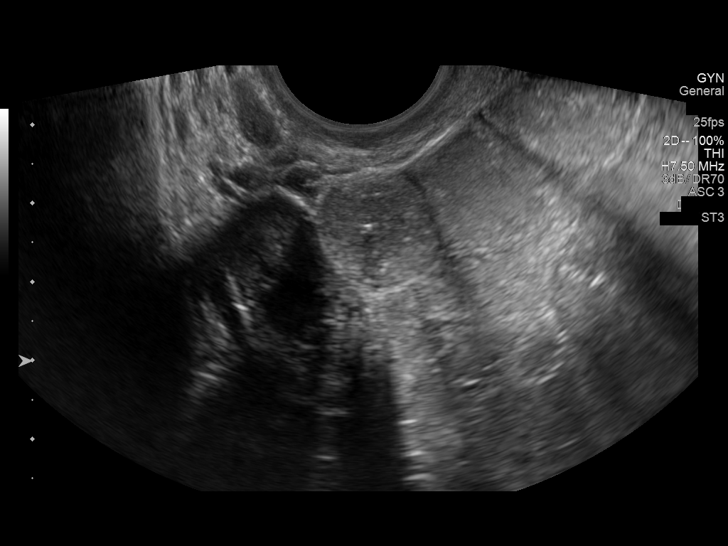
[im 44/66]
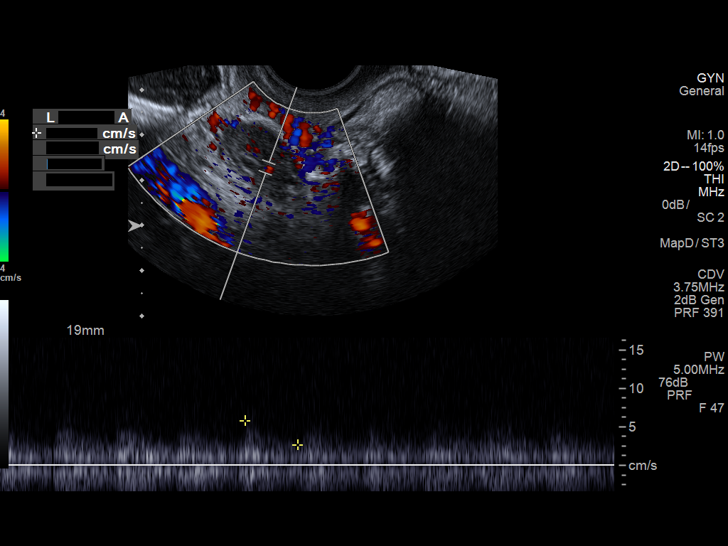
[im 49/66]
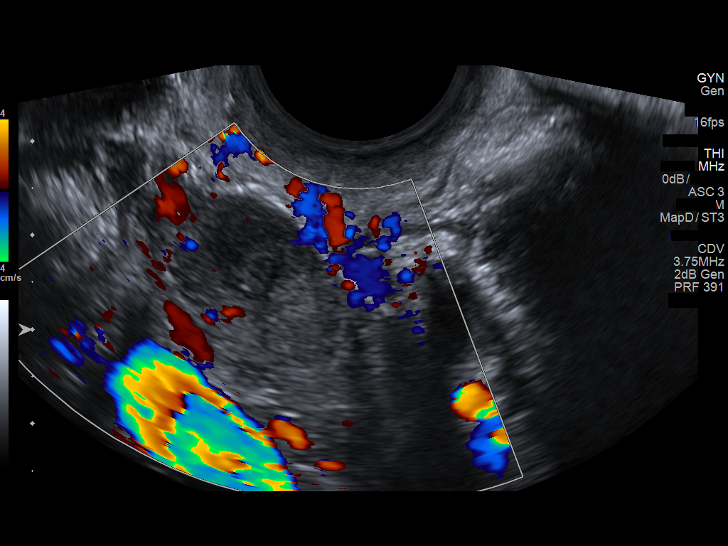
[im 55/66]
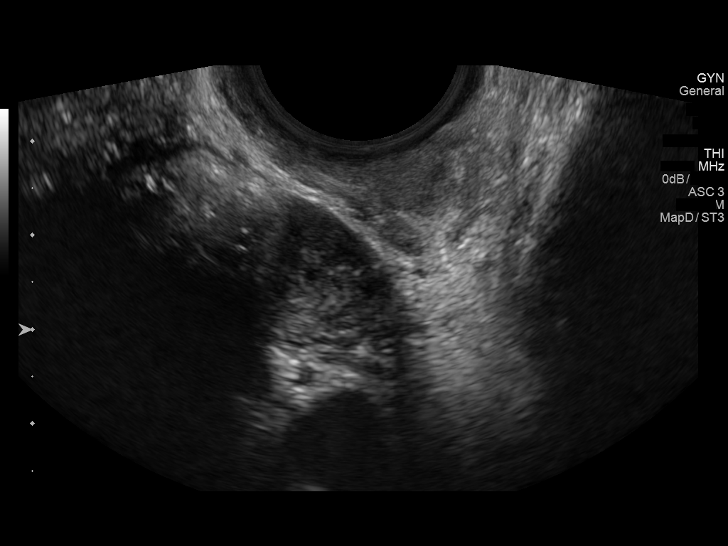
[im 60/66]
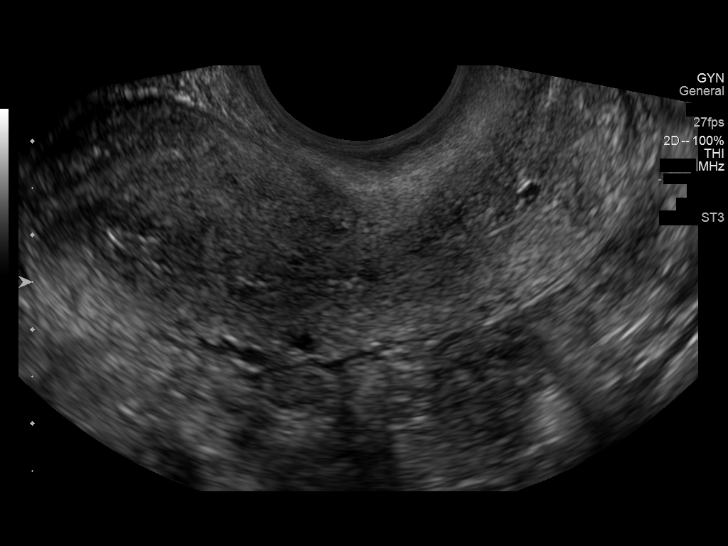
[im 66/66]
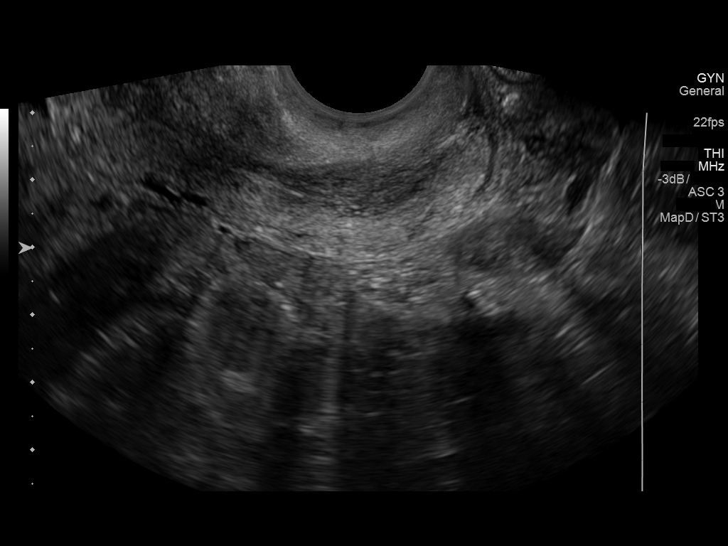

[13 of 25 positions shown; findings below may reference images not displayed]

FINDINGS: Uterus

Measurements: 6.4 x 2.5 x 2.9 cm. No fibroids or other mass
visualized.

Endometrium

Thickness: 4.8 mm.  No focal abnormality visualized.

Right ovary

Measurements: 3.1 x 1.6 x 1.7 cm. There is a simple cyst measuring
1.8 x 1.4 x 1.2 cm.

Left ovary

Measurements: 2.4 x 1.4 x 1.2 cm. There is a complex cyst measuring
1 x 0.9 x 0.9 cm.

Pulsed Doppler evaluation of both ovaries demonstrates normal
low-resistance arterial and venous waveforms.

Other findings

No abnormal free fluid.
IMPRESSION: No evidence of ovarian torsion. 1 cm complex cyst in the left ovary
which could represent hemorrhagic cyst.

## 2017-11-04 IMAGING — US US RENAL
1 series · 14 of 25 positions shown · non-contrast
Comparison: None.

CLINICAL DATA: Hematuria and left flank pain

EXAM:
RENAL / URINARY TRACT ULTRASOUND COMPLETE

[Series 1: us renal · 0.22mm/px · 14 of 28 slices shown]
[im 1/28]
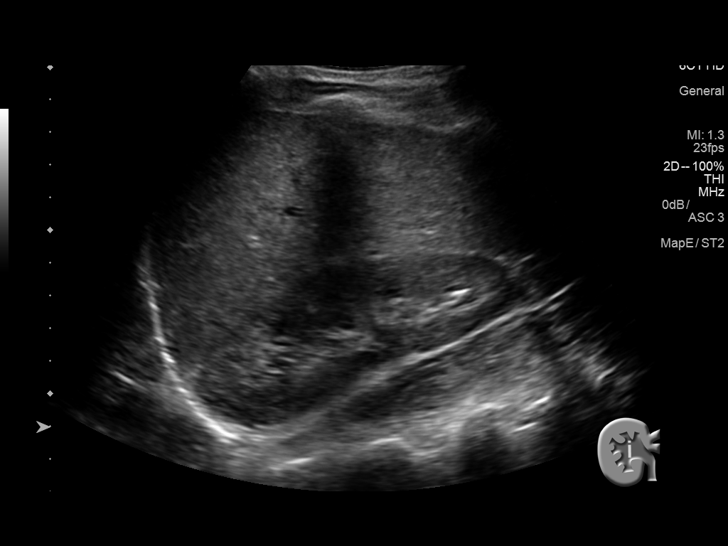
[im 3/28]
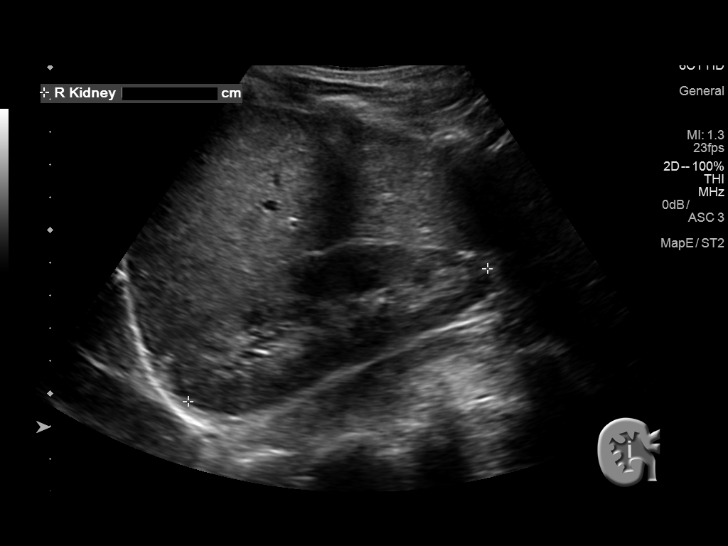
[im 5/28]
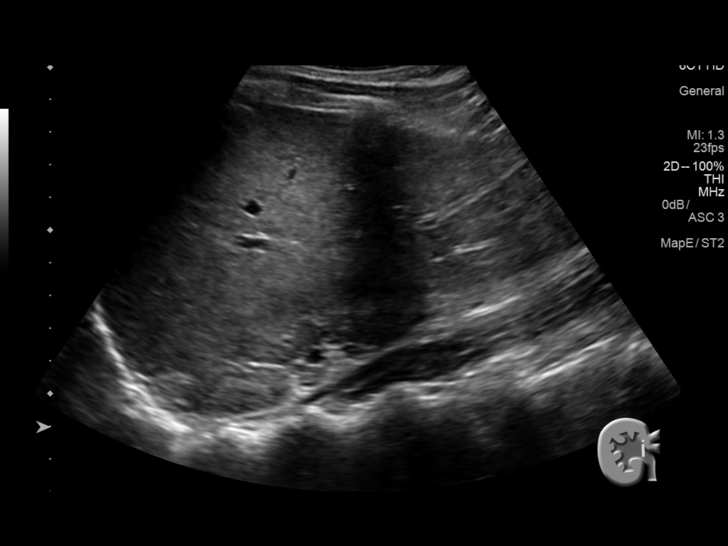
[im 7/28]
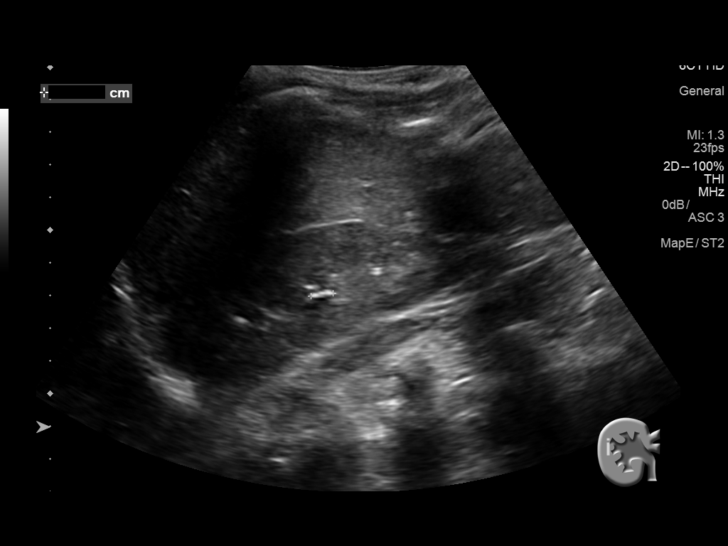
[im 10/28]
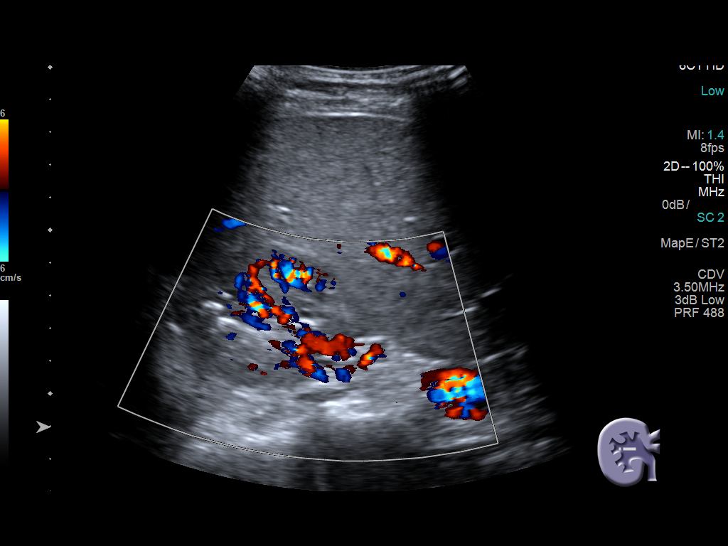
[im 11/28]
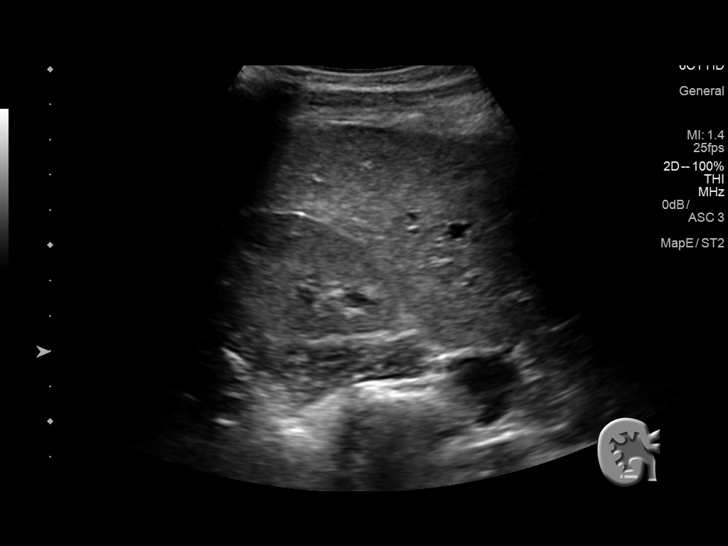
[im 13/28]
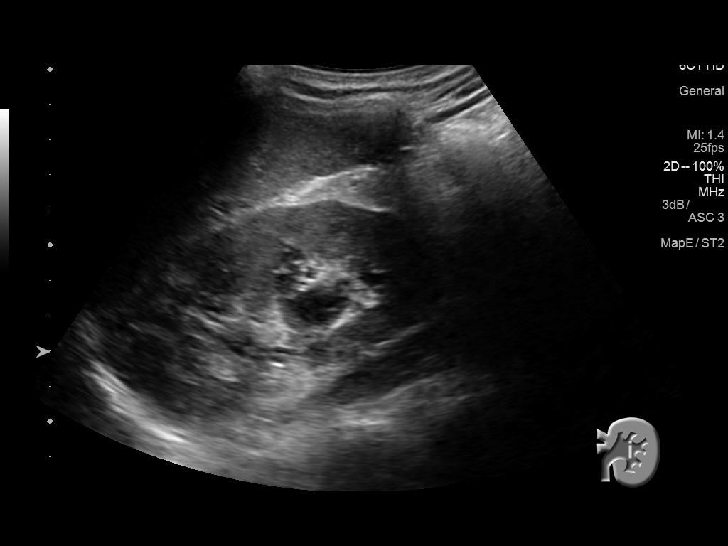
[im 15/28]
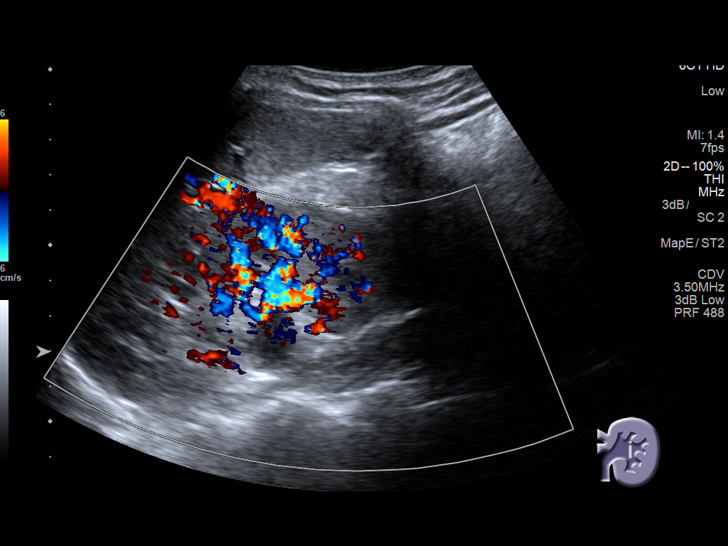
[im 17/28]
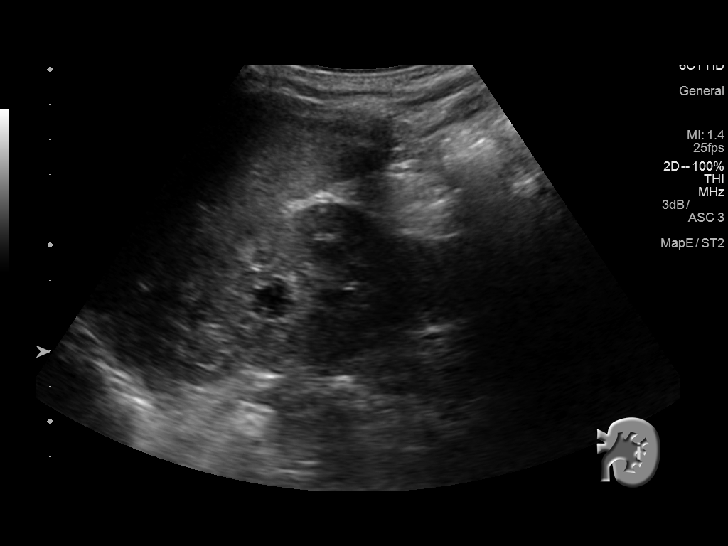
[im 19/28]
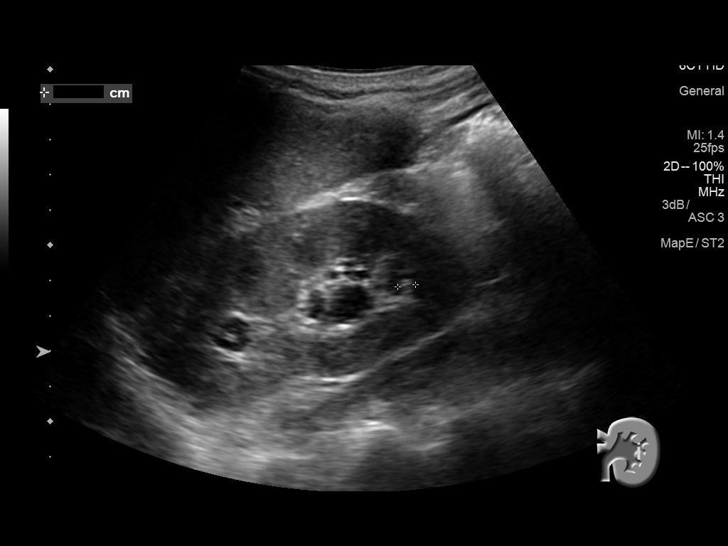
[im 21/28]
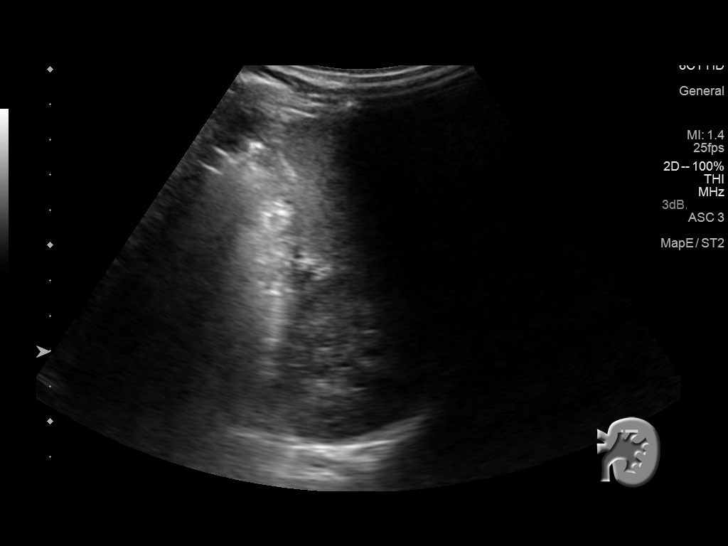
[im 23/28]
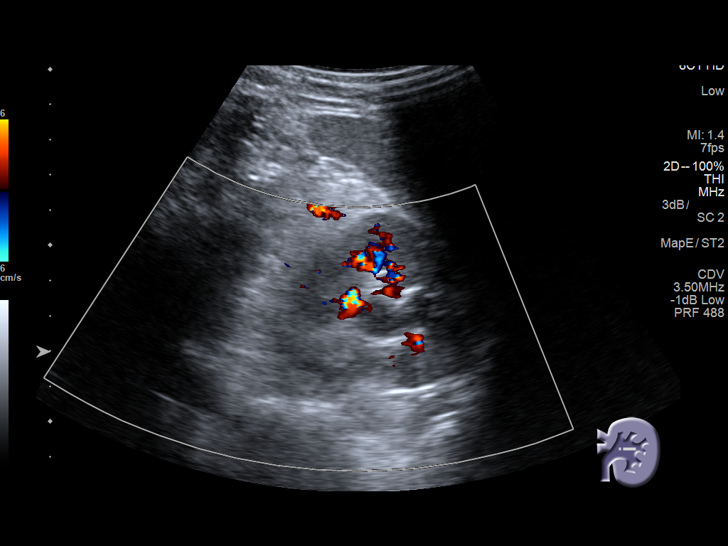
[im 25/28]
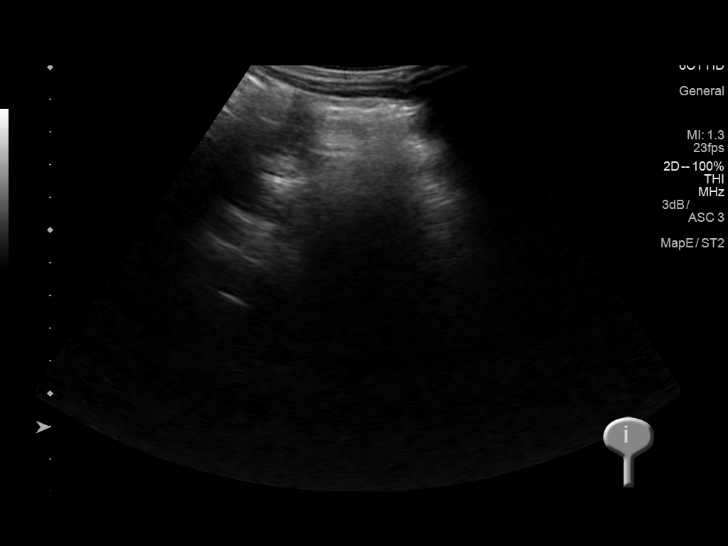
[im 28/28]
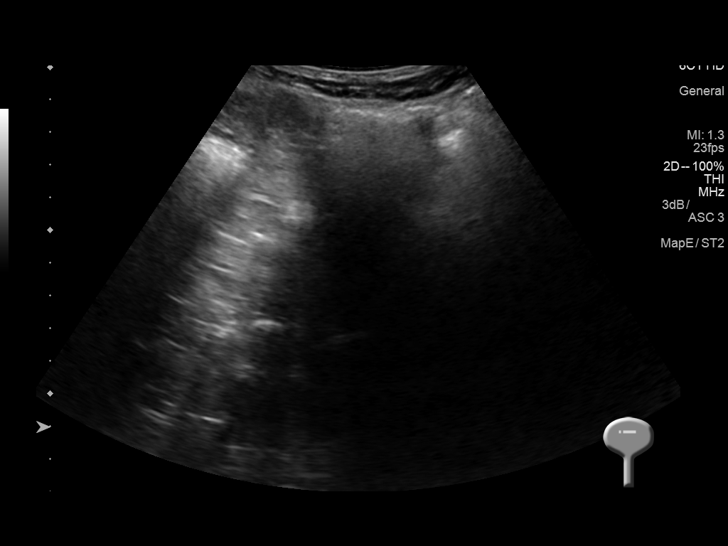

[14 of 25 positions shown; findings below may reference images not displayed]

FINDINGS: Right Kidney:

Length: 10 cm. Several echogenic foci are identified within the
right kidney largest measures 6.8 mm. No mass or hydronephrosis
visualized.

Left Kidney:

Length: 9.4 cm. Several echogenic foci are identified within the
left kidney largest measures 5.1 mm. There is question dilatation of
the proximal left ureter.

Bladder:

The bladder is decompressed limiting evaluation.
IMPRESSION: Bilateral nonobstructing stones identified within the kidneys. There
is questioned dilatation of the proximal left ureter, hydronephrosis
is not excluded.

## 2018-01-12 DIAGNOSIS — Z23 Encounter for immunization: Secondary | ICD-10-CM | POA: Diagnosis not present

## 2018-06-23 ENCOUNTER — Other Ambulatory Visit: Payer: Self-pay

## 2018-06-25 ENCOUNTER — Encounter: Payer: 59 | Admitting: Gynecology

## 2018-06-25 ENCOUNTER — Encounter: Payer: Self-pay | Admitting: Gynecology

## 2018-06-25 ENCOUNTER — Ambulatory Visit (INDEPENDENT_AMBULATORY_CARE_PROVIDER_SITE_OTHER): Payer: 59 | Admitting: Gynecology

## 2018-06-25 ENCOUNTER — Other Ambulatory Visit: Payer: Self-pay

## 2018-06-25 VITALS — BP 134/86 | Ht 63.0 in | Wt 94.0 lb

## 2018-06-25 DIAGNOSIS — Z13220 Encounter for screening for lipoid disorders: Secondary | ICD-10-CM | POA: Diagnosis not present

## 2018-06-25 DIAGNOSIS — Z113 Encounter for screening for infections with a predominantly sexual mode of transmission: Secondary | ICD-10-CM | POA: Diagnosis not present

## 2018-06-25 DIAGNOSIS — Z01419 Encounter for gynecological examination (general) (routine) without abnormal findings: Secondary | ICD-10-CM | POA: Diagnosis not present

## 2018-06-25 DIAGNOSIS — Z1151 Encounter for screening for human papillomavirus (HPV): Secondary | ICD-10-CM | POA: Diagnosis not present

## 2018-06-25 DIAGNOSIS — Z1159 Encounter for screening for other viral diseases: Secondary | ICD-10-CM | POA: Diagnosis not present

## 2018-06-25 MED ORDER — NORETHINDRONE ACET-ETHINYL EST 1-20 MG-MCG PO TABS: 1.0000 | ORAL_TABLET | Freq: Every day | ORAL | 4 refills | Status: DC

## 2018-06-25 MED ORDER — VALACYCLOVIR HCL 500 MG PO TABS: 500.0000 mg | ORAL_TABLET | Freq: Two times a day (BID) | ORAL | 0 refills | Status: DC

## 2018-06-25 NOTE — Progress Notes (Signed)
    Katie Green March 26, 1986 [id number]        33 y.o.  G0P0 for annual gynecologic exam.  Without gynecologic complaints  Past medical history,surgical history, problem list, medications, allergies, family history and social history were all reviewed and documented as reviewed in the EPIC chart.  ROS:  Performed with pertinent positives and negatives included in the history, assessment and plan.   Additional significant findings : None   Exam: [name] assistant Vitals:   06/25/18 1221  BP: 134/86  Weight: 94 lb (42.6 kg)  Height: 5\' 3"  (1.6 m)   Body mass index is 16.65 kg/m.  General appearance:  Normal affect, orientation and appearance. Skin: Grossly normal HEENT: Without gross lesions.  No cervical or supraclavicular adenopathy. Thyroid normal.  Lungs:  Clear without wheezing, rales or rhonchi Cardiac: RR, without RMG Abdominal:  Soft, nontender, without masses, guarding, rebound, organomegaly or hernia Breasts:  Examined lying and sitting without masses, retractions, discharge or axillary adenopathy. Pelvic:  Ext, BUS, Vagina: Normal  Cervix: Normal.  GC/chlamydia, Pap/HPV  Uterus: Anteverted, normal size, shape and contour, midline and mobile nontender   Adnexa: Without masses or tenderness    Anus and perineum: Normal   Rectovaginal: Normal sphincter tone without palpated masses or tenderness.    Assessment/Plan:  33 y.o. G0P0 female for annual gynecologic exam.  With regular menses, oral contraceptives  1. Oral contraceptives.  On Loestrin 1/20 equivalent.  Doing well and wants to continue.  Had smoked but quit last year.  We discussed the possible increased risk of thrombosis to include stroke heart attack DVT.  When if ever smoking affects wear off.  At this point the patient's comfortable continuing and I refilled her x1 year. 2. History HSV.  Has not had outbreaks in years but requests Valtrex refill to have available just in case.  Valtrex 500 mg #30, 1  p.o. twice daily at earliest onset of outbreak times several days 3. Breast health.  Breast exam normal today.  SBE monthly reviewed.  Plan baseline mammogram at age 33. 4. STD screening requested.  GC/chlamydia, HIV, hepatitis B, hepatitis C and RPR ordered. 5. Pap smear 2017.  Pap smear/HPV done today.  History of LEEP with high-grade dysplasia 2012.  Normal Pap smears since. 6. Health maintenance.  Baseline CBC, CMP and lipid profile ordered along with the STD blood work.  Follow-up 1 year, sooner as needed.   Dara Lords MD, 12:58 PM 06/25/2018

## 2018-06-25 NOTE — Addendum Note (Signed)
Addended by: Dayna Barker on: 06/25/2018 01:07 PM   Modules accepted: Orders

## 2018-06-25 NOTE — Patient Instructions (Signed)
Follow-up in 1 year for annual exam, sooner if any issues. 

## 2018-06-26 ENCOUNTER — Other Ambulatory Visit: Payer: Self-pay | Admitting: Gynecology

## 2018-06-26 LAB — C. TRACHOMATIS/N. GONORRHOEAE RNA
C. trachomatis RNA, TMA: NOT DETECTED
N. gonorrhoeae RNA, TMA: NOT DETECTED

## 2018-06-28 ENCOUNTER — Encounter: Payer: Self-pay | Admitting: Gynecology

## 2018-06-28 LAB — CBC WITH DIFFERENTIAL/PLATELET
Absolute Monocytes: 370 cells/uL (ref 200–950)
Basophils Absolute: 59 cells/uL (ref 0–200)
Basophils Relative: 0.8 %
EOS ABS: 30 {cells}/uL (ref 15–500)
Eosinophils Relative: 0.4 %
HCT: 36.5 % (ref 35.0–45.0)
Hemoglobin: 12.9 g/dL (ref 11.7–15.5)
Lymphs Abs: 2893 cells/uL (ref 850–3900)
MCH: 33.9 pg — AB (ref 27.0–33.0)
MCHC: 35.3 g/dL (ref 32.0–36.0)
MCV: 95.8 fL (ref 80.0–100.0)
MPV: 9.4 fL (ref 7.5–12.5)
Monocytes Relative: 5 %
Neutro Abs: 4048 cells/uL (ref 1500–7800)
Neutrophils Relative %: 54.7 %
Platelets: 458 10*3/uL — ABNORMAL HIGH (ref 140–400)
RBC: 3.81 10*6/uL (ref 3.80–5.10)
RDW: 12.6 % (ref 11.0–15.0)
Total Lymphocyte: 39.1 %
WBC: 7.4 10*3/uL (ref 3.8–10.8)

## 2018-06-28 LAB — COMPREHENSIVE METABOLIC PANEL
AG Ratio: 2 (calc) (ref 1.0–2.5)
ALT: 9 U/L (ref 6–29)
AST: 13 U/L (ref 10–30)
Albumin: 4.6 g/dL (ref 3.6–5.1)
Alkaline phosphatase (APISO): 33 U/L (ref 31–125)
BUN: 17 mg/dL (ref 7–25)
CO2: 23 mmol/L (ref 20–32)
Calcium: 9.4 mg/dL (ref 8.6–10.2)
Chloride: 101 mmol/L (ref 98–110)
Creat: 0.97 mg/dL (ref 0.50–1.10)
Globulin: 2.3 g/dL (calc) (ref 1.9–3.7)
Glucose, Bld: 88 mg/dL (ref 65–99)
Potassium: 3.4 mmol/L — ABNORMAL LOW (ref 3.5–5.3)
Sodium: 136 mmol/L (ref 135–146)
Total Bilirubin: 0.4 mg/dL (ref 0.2–1.2)
Total Protein: 6.9 g/dL (ref 6.1–8.1)

## 2018-06-28 LAB — PAP IG AND HPV HIGH-RISK: HPV DNA High Risk: NOT DETECTED

## 2018-06-28 LAB — LIPID PANEL
Cholesterol: 198 mg/dL (ref <200)
HDL: 67 mg/dL (ref >=50)
LDL Cholesterol (Calc): 103 mg/dL (calc) — ABNORMAL HIGH
Non-HDL Cholesterol (Calc): 131 mg/dL (calc) — ABNORMAL HIGH (ref <130)
Total CHOL/HDL Ratio: 3 (calc) (ref <5.0)
Triglycerides: 163 mg/dL — ABNORMAL HIGH (ref <150)

## 2018-06-28 LAB — RPR: RPR Ser Ql: NONREACTIVE

## 2018-06-28 LAB — HIV ANTIBODY (ROUTINE TESTING W REFLEX): HIV 1&2 Ab, 4th Generation: NONREACTIVE

## 2018-06-28 LAB — HEPATITIS C ANTIBODY
Hepatitis C Ab: NONREACTIVE
SIGNAL TO CUT-OFF: 0 (ref <1.00)

## 2018-06-28 LAB — HEPATITIS B SURFACE ANTIGEN: Hepatitis B Surface Ag: NONREACTIVE

## 2018-06-29 NOTE — Telephone Encounter (Signed)
Certainly possible as far as the cramping.  It will be interesting to see when the patient supplements the potassium whether her cramping gets better.

## 2018-12-20 ENCOUNTER — Encounter: Payer: Self-pay | Admitting: Gynecology

## 2019-02-01 IMAGING — CT CT RENAL STONE PROTOCOL
2 of 3 series · 17 of 46 positions shown, 19 images · non-contrast
Comparison: None.

CLINICAL DATA: Recurrent vaginal pain [REDACTED].

EXAM:
CT ABDOMEN AND PELVIS WITHOUT CONTRAST
TECHNIQUE: Multidetector CT imaging of the abdomen and pelvis was performed
following the standard protocol without IV contrast.

[Series 3: coronal · coronal · 0.66mm/px · 3 of 111 slices shown]
[im 37/111  soft-tissue]
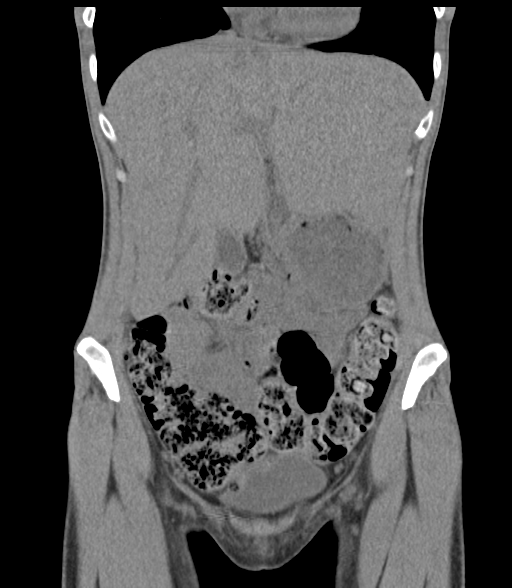
[im 49/111  soft-tissue]
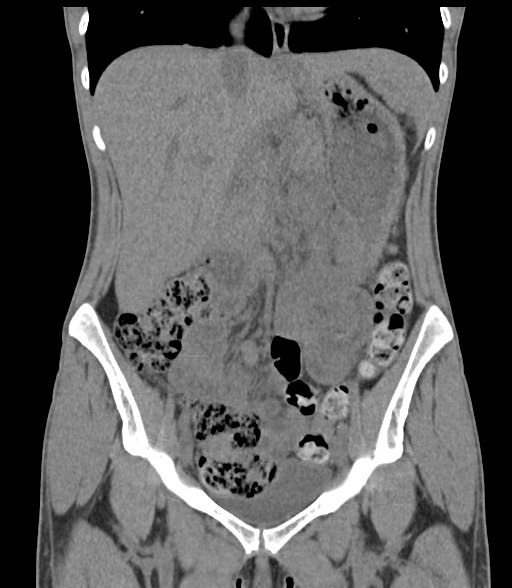
[im 62/111  soft-tissue]
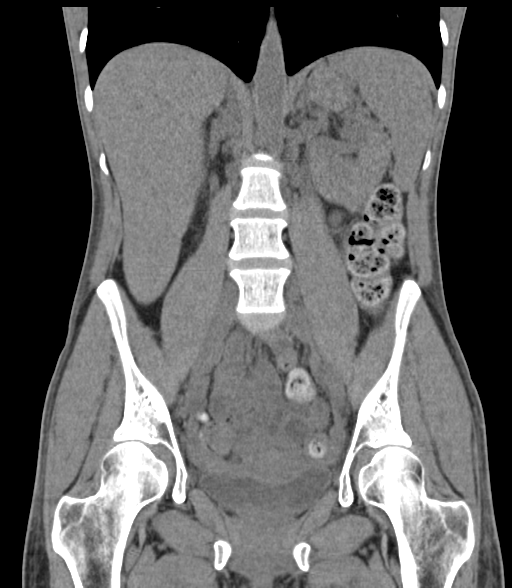

[Series 6: lung · axial · 0.58mm/px · z∈[+1123,+1189]mm · 14 of 39 slices shown, 16 images]
[im 3/39  soft-tissue]
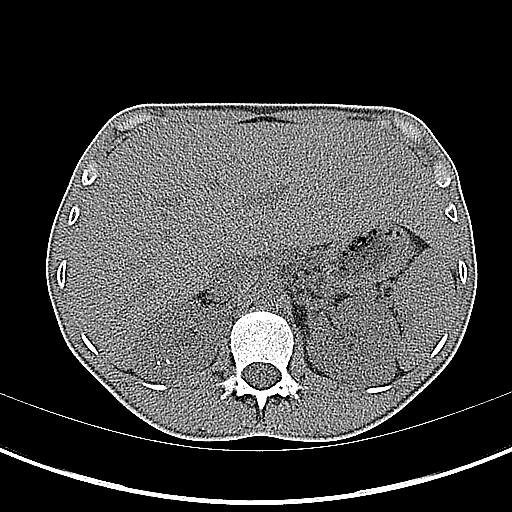
[im 3/39  bone]
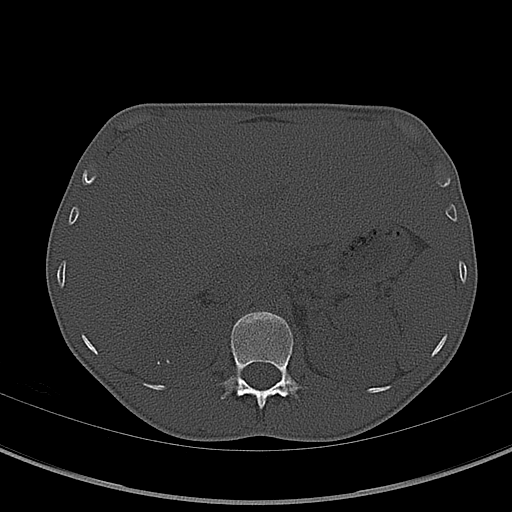
[im 5/39  soft-tissue]
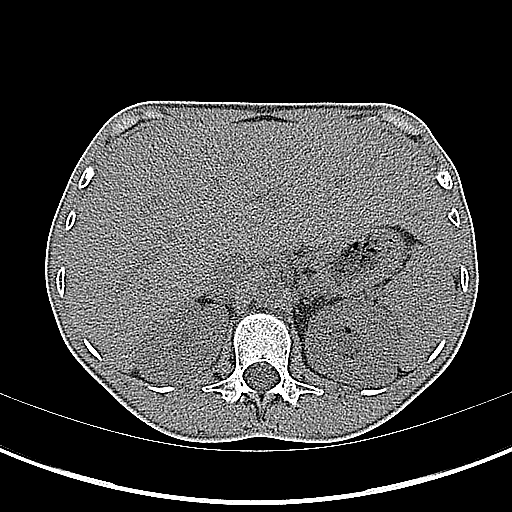
[im 8/39  soft-tissue]
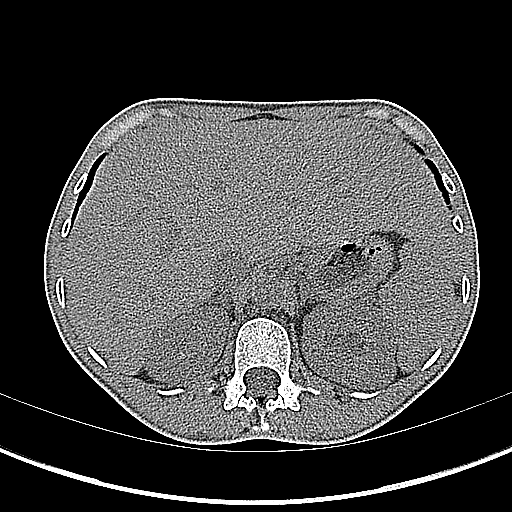
[im 10/39  soft-tissue]
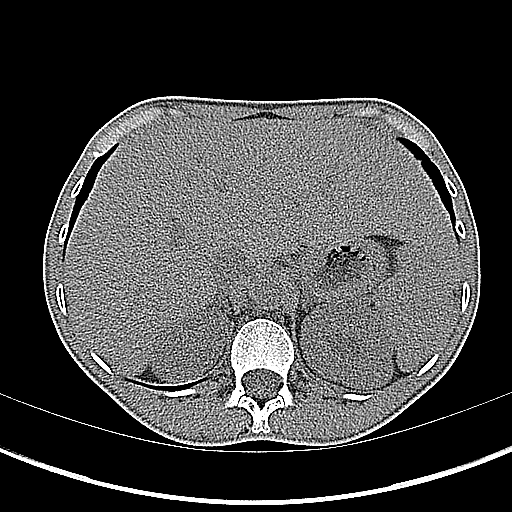
[im 13/39  soft-tissue]
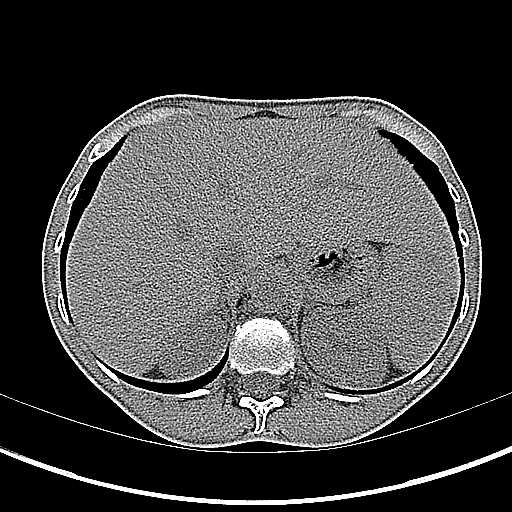
[im 15/39  soft-tissue]
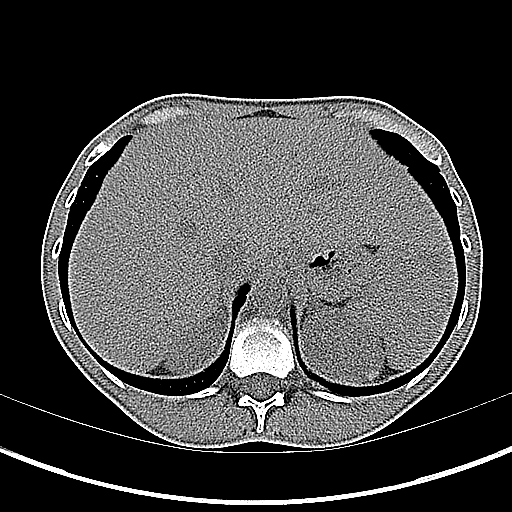
[im 18/39  soft-tissue]
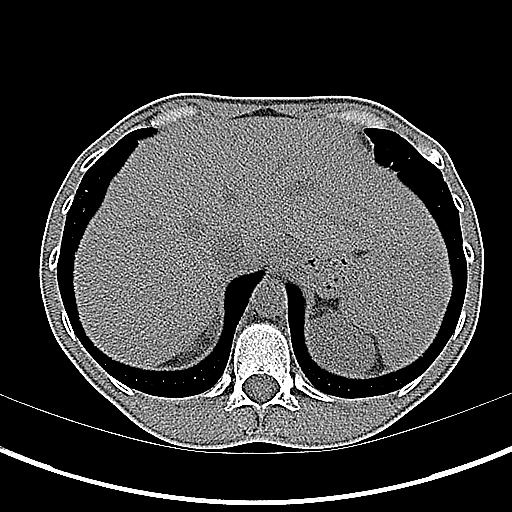
[im 21/39  soft-tissue]
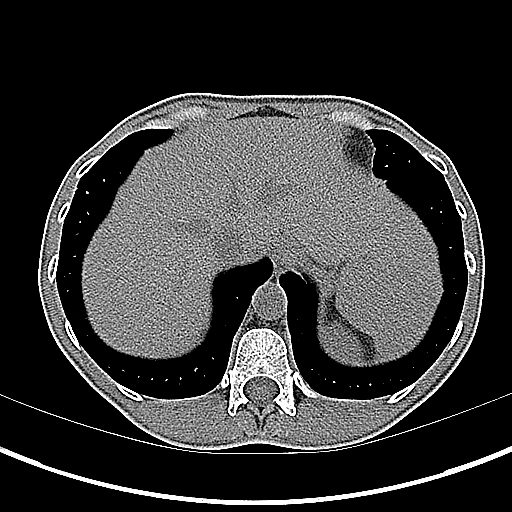
[im 24/39  soft-tissue]
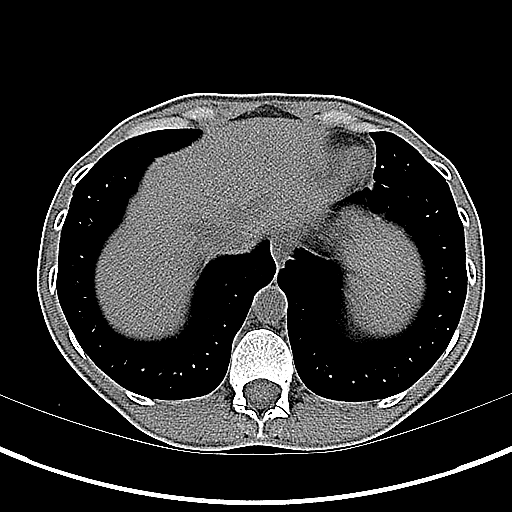
[im 24/39  bone]
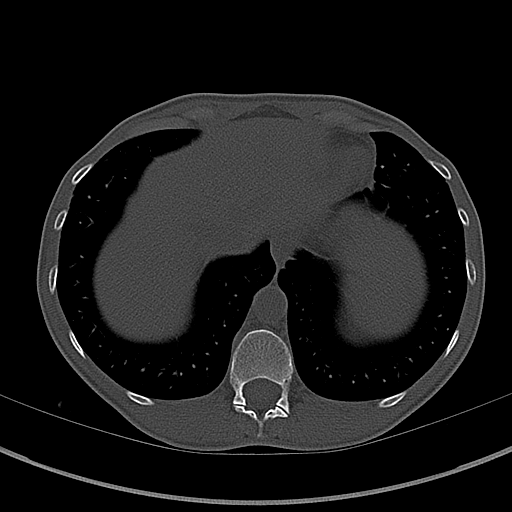
[im 26/39  soft-tissue]
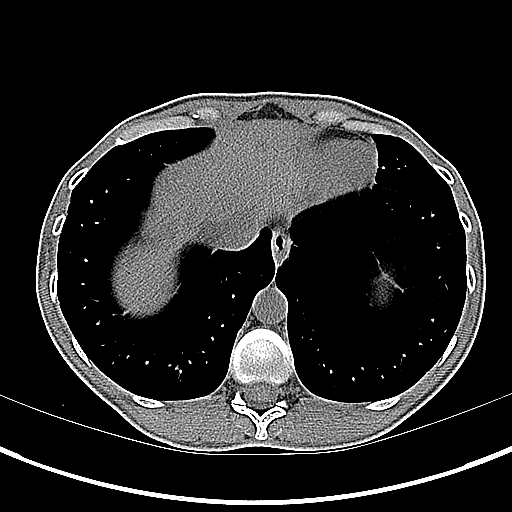
[im 29/39  soft-tissue]
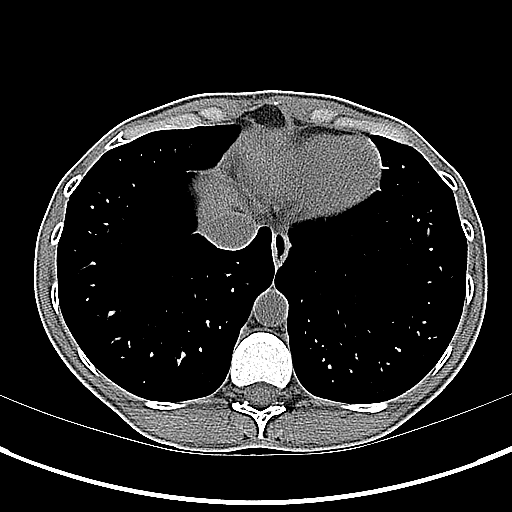
[im 31/39  soft-tissue]
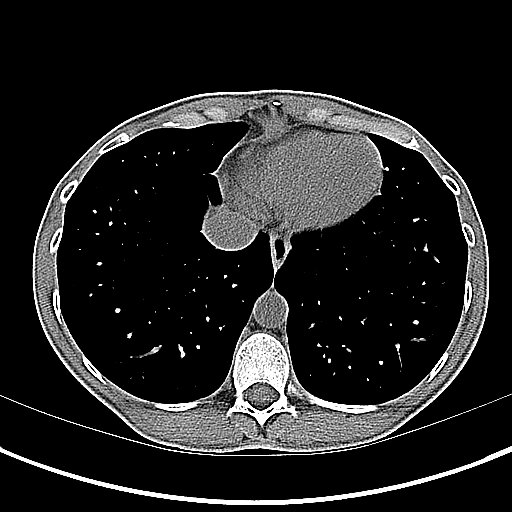
[im 34/39  soft-tissue]
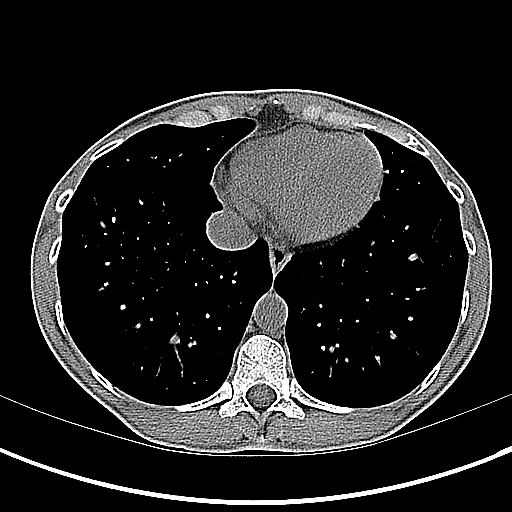
[im 36/39  soft-tissue]
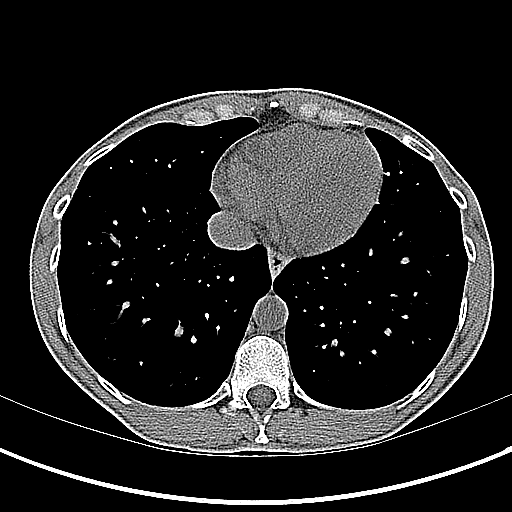

[17 of 46 positions shown; findings below may reference images not displayed]

FINDINGS: Lower chest: No acute abnormality.

Hepatobiliary: No focal liver abnormality is seen. The liver is
enlarged measuring 18 cm in length. No gallstones, gallbladder wall
thickening, or biliary dilatation.

Pancreas: Unremarkable. No pancreatic ductal dilatation or
surrounding inflammatory changes.

Spleen: Normal in size without focal abnormality.

Adrenals/Urinary Tract: The adrenal glands are normal. There are two
2 mm nonobstructing stones in the midpole right kidney. There is no
hydronephrosis bilaterally. The bladder is normal.

Stomach/Bowel: Stomach is within normal limits. The appendix is not
definitely seen. No evidence of bowel wall thickening, distention,
or inflammatory changes.

Vascular/Lymphatic: No significant vascular findings are present. No
enlarged abdominal or pelvic lymph nodes.

Reproductive: Uterus and bilateral adnexa are unremarkable.

Other: No abdominal wall hernia or abnormality. No abdominopelvic
ascites.

Musculoskeletal: No acute or significant osseous findings.
IMPRESSION: No acute abnormality identified in the abdomen and pelvis.

Small nonobstructing stones within the right kidney. No
hydronephrosis bilaterally.

## 2019-03-21 ENCOUNTER — Encounter: Payer: Self-pay | Admitting: Gynecology

## 2019-06-29 ENCOUNTER — Other Ambulatory Visit: Payer: Self-pay

## 2019-06-30 ENCOUNTER — Ambulatory Visit (INDEPENDENT_AMBULATORY_CARE_PROVIDER_SITE_OTHER): Payer: 59 | Admitting: Obstetrics and Gynecology

## 2019-06-30 ENCOUNTER — Encounter: Payer: Self-pay | Admitting: Obstetrics and Gynecology

## 2019-06-30 VITALS — BP 122/76 | Ht 63.0 in | Wt 91.0 lb

## 2019-06-30 DIAGNOSIS — Z01419 Encounter for gynecological examination (general) (routine) without abnormal findings: Secondary | ICD-10-CM | POA: Diagnosis not present

## 2019-06-30 DIAGNOSIS — Z13220 Encounter for screening for lipoid disorders: Secondary | ICD-10-CM | POA: Diagnosis not present

## 2019-06-30 DIAGNOSIS — Z113 Encounter for screening for infections with a predominantly sexual mode of transmission: Secondary | ICD-10-CM | POA: Diagnosis not present

## 2019-06-30 MED ORDER — NORETHIN ACE-ETH ESTRAD-FE 1-20 MG-MCG PO TABS: 1.0000 | ORAL_TABLET | Freq: Every day | ORAL | 4 refills | Status: DC

## 2019-06-30 NOTE — Progress Notes (Signed)
Katie Green 04-06-1985 [id number]  SUBJECTIVE:  34 y.o. G0P0 female for annual routine gynecologic exam. She has no gynecologic concerns.  She does request STD screening.  Current Outpatient Medications  Medication Sig Dispense Refill  . amphetamine-dextroamphetamine (ADDERALL) 20 MG tablet TAKE 1 TABLET BY MOUTH EVERY MORNING, AT NOON, AND AT 4PM  0  . citalopram (CELEXA) 40 MG tablet Take 1 tablet (40 mg total) by mouth daily. 90 tablet 1  . ferrous sulfate 325 (65 FE) MG EC tablet Take 325 mg by mouth daily with supper.     Colleen Can 1/20 1-20 MG-MCG tablet TAKE 1 TABLET BY MOUTH  DAILY 63 tablet 4  . LORazepam (ATIVAN) 1 MG tablet Take 1 mg by mouth every 8 (eight) hours.    Marland Kitchen spironolactone (ALDACTONE) 50 MG tablet Take 50 mg by mouth daily.    . valACYclovir (VALTREX) 500 MG tablet Take 1 tablet (500 mg total) by mouth 2 (two) times daily. With outbreak 30 tablet 0  . vitamin B-12 (CYANOCOBALAMIN) 1000 MCG tablet Take 1,000 mcg by mouth daily.      No current facility-administered medications for this visit.   Allergies: Patient has no known allergies.  No LMP recorded. (Menstrual status: Oral contraceptives).  Past medical history,surgical history, problem list, medications, allergies, family history and social history were all reviewed and documented as reviewed in the EPIC chart.  ROS:  Feeling well. No dyspnea or chest pain on exertion.  No abdominal pain, change in bowel habits, black or bloody stools.  No urinary tract symptoms. GYN ROS: no abnormal bleeding, pelvic pain or discharge, no breast pain or new or enlarging lumps on self exam. No neurological complaints.   OBJECTIVE:  BP 122/76   Ht 5\' 3"  (1.6 m)   Wt 91 lb (41.3 kg)   BMI 16.12 kg/m  The patient appears well, alert, oriented x 3, in no distress. ENT normal.  Neck supple. No cervical or supraclavicular adenopathy or thyromegaly.  Lungs are clear, good air entry, no wheezes, rhonchi or rales. S1 and S2  normal, no murmurs, regular rate and rhythm.  Abdomen soft without tenderness, guarding, mass or organomegaly.  Neurological is normal, no focal findings.  BREAST EXAM: breasts appear normal, no suspicious masses, no skin or nipple changes or axillary nodes  PELVIC EXAM: VULVA: normal appearing vulva with no masses, tenderness or lesions, VAGINA: normal appearing vagina with normal color and discharge, no lesions, CERVIX: normal appearing cervix without discharge or lesions, UTERUS: uterus is normal size, shape, consistency and nontender, ADNEXA: normal adnexa in size, nontender and no masses  Chaperone: present during the examination  ASSESSMENT:  34 y.o. G0P0 here for annual gynecologic exam  PLAN:   1. No menstrual or hormonal concerns.  2. Pap smear/HPV 05/2018.  History of LEEP with high-grade dysplasia in 2012 with normal Pap smears since then.  Current screening guidelines calling for the 3-year Pap smear cytology only interval. 3. Contraception.  Junel Fe equivalent refilled.  No longer smoking.  Thrombotic risks have been reviewed. 4. History of HSV.  Uses Valtrex as needed, indicates she does not need a refill at this time. 5.  STD screen requested.  GC/committee a, HIV, hepatitis B surface antigen, hepatitis C, RPR ordered. 6. Normal breast exam.  Encouraged breast self awareness and notify 2013 of any concerning changes. 7. Health maintenance.  Routine screening blood work (lipids, CBC, CMP) be collected today.  Evaded triglycerides noted last year.  Return  annually or sooner, prn.  Joseph Pierini MD, FACOG  06/30/19

## 2019-07-01 LAB — COMPREHENSIVE METABOLIC PANEL
AG Ratio: 2 (calc) (ref 1.0–2.5)
ALT: 8 U/L (ref 6–29)
AST: 11 U/L (ref 10–30)
Albumin: 4.7 g/dL (ref 3.6–5.1)
Alkaline phosphatase (APISO): 28 U/L — ABNORMAL LOW (ref 31–125)
BUN/Creatinine Ratio: 19 (calc) (ref 6–22)
BUN: 23 mg/dL (ref 7–25)
CO2: 21 mmol/L (ref 20–32)
Calcium: 9.7 mg/dL (ref 8.6–10.2)
Chloride: 104 mmol/L (ref 98–110)
Creat: 1.18 mg/dL — ABNORMAL HIGH (ref 0.50–1.10)
Globulin: 2.3 g/dL (calc) (ref 1.9–3.7)
Glucose, Bld: 82 mg/dL (ref 65–99)
Potassium: 3.8 mmol/L (ref 3.5–5.3)
Sodium: 137 mmol/L (ref 135–146)
Total Bilirubin: 0.2 mg/dL (ref 0.2–1.2)
Total Protein: 7 g/dL (ref 6.1–8.1)

## 2019-07-01 LAB — RPR: RPR Ser Ql: NONREACTIVE

## 2019-07-01 LAB — CBC
HCT: 33.9 % — ABNORMAL LOW (ref 35.0–45.0)
Hemoglobin: 11.4 g/dL — ABNORMAL LOW (ref 11.7–15.5)
MCH: 32.8 pg (ref 27.0–33.0)
MCHC: 33.6 g/dL (ref 32.0–36.0)
MCV: 97.4 fL (ref 80.0–100.0)
MPV: 9.2 fL (ref 7.5–12.5)
Platelets: 473 10*3/uL — ABNORMAL HIGH (ref 140–400)
RBC: 3.48 10*6/uL — ABNORMAL LOW (ref 3.80–5.10)
RDW: 12.9 % (ref 11.0–15.0)
WBC: 8 10*3/uL (ref 3.8–10.8)

## 2019-07-01 LAB — HEPATITIS C ANTIBODY
Hepatitis C Ab: NONREACTIVE
SIGNAL TO CUT-OFF: 0.01 (ref <1.00)

## 2019-07-01 LAB — C. TRACHOMATIS/N. GONORRHOEAE RNA
C. trachomatis RNA, TMA: NOT DETECTED
N. gonorrhoeae RNA, TMA: NOT DETECTED

## 2019-07-01 LAB — LIPID PANEL
Cholesterol: 184 mg/dL (ref <200)
HDL: 65 mg/dL (ref >=50)
LDL Cholesterol (Calc): 100 mg/dL (calc) — ABNORMAL HIGH
Non-HDL Cholesterol (Calc): 119 mg/dL (calc) (ref <130)
Total CHOL/HDL Ratio: 2.8 (calc) (ref <5.0)
Triglycerides: 95 mg/dL (ref <150)

## 2019-07-01 LAB — HEPATITIS B SURFACE ANTIGEN: Hepatitis B Surface Ag: NONREACTIVE

## 2019-07-01 LAB — HIV ANTIBODY (ROUTINE TESTING W REFLEX): HIV 1&2 Ab, 4th Generation: NONREACTIVE

## 2019-11-30 ENCOUNTER — Ambulatory Visit: Payer: 59 | Admitting: Family Medicine

## 2019-11-30 ENCOUNTER — Encounter: Payer: Self-pay | Admitting: Family Medicine

## 2019-11-30 ENCOUNTER — Other Ambulatory Visit: Payer: Self-pay

## 2019-11-30 ENCOUNTER — Ambulatory Visit: Payer: Self-pay

## 2019-11-30 ENCOUNTER — Ambulatory Visit: Payer: 59 | Admitting: Nurse Practitioner

## 2019-11-30 ENCOUNTER — Encounter: Payer: Self-pay | Admitting: Nurse Practitioner

## 2019-11-30 VITALS — BP 122/80 | HR 108 | Ht 63.0 in | Wt 94.0 lb

## 2019-11-30 VITALS — BP 114/70 | HR 99 | Temp 97.7°F | Ht 63.0 in | Wt 93.4 lb

## 2019-11-30 DIAGNOSIS — M25511 Pain in right shoulder: Secondary | ICD-10-CM | POA: Diagnosis not present

## 2019-11-30 DIAGNOSIS — M7501 Adhesive capsulitis of right shoulder: Secondary | ICD-10-CM | POA: Diagnosis not present

## 2019-11-30 MED ORDER — GABAPENTIN 100 MG PO CAPS: 100.0000 mg | ORAL_CAPSULE | Freq: Three times a day (TID) | ORAL | 3 refills | Status: DC | PRN

## 2019-11-30 MED ORDER — TRAMADOL HCL 50 MG PO TABS: 50.0000 mg | ORAL_TABLET | Freq: Three times a day (TID) | ORAL | 0 refills | Status: DC | PRN

## 2019-11-30 MED ORDER — DICLOFENAC SODIUM 1 % EX GEL: 2.0000 g | Freq: Four times a day (QID) | CUTANEOUS | Status: DC

## 2019-11-30 NOTE — Progress Notes (Signed)
Subjective:   I Kana Thompson am serving as a Neurosurgeon for Dr. Clementeen Graham.  I'm seeing this patient as a consultation for:  Katie Penna, NP. Note will be routed back to referring provider/PCP.  CC: R shoulder pain  HPI: Pt is a 34 y/o female presenting w/ c/o R shoulder pain and decreased ROM x 9 days. States she woke up with right sided neck pain. Pain radiates down the arm. Numbness and tingling in the forearm arm and hand. Shoulder ROM is limited. Pain keeps her up at night. Neck limited ROM. Headache this past weekend. Grip strength weakness in the shoulder.  Radiating pain: yes  R shoulder mechanical symptoms:  Aggravating factors: sleeping, flexion, driving Treatments tried: oral medications, ice, heat, topical, theragun  Past medical history, Surgical history, Family history, Social history, Allergies, and medications have been entered into the medical record, reviewed.  Left leg complex regional pain syndrome well managed  Review of Systems: No new headache, visual changes, nausea, vomiting, diarrhea, constipation, dizziness, abdominal pain, skin rash, fevers, chills, night sweats, weight loss, swollen lymph nodes, body aches, joint swelling, muscle aches, chest pain, shortness of breath, mood changes, visual or auditory hallucinations.   Objective:    Vitals:   11/30/19 1541  BP: 122/80  Pulse: (!) 108  SpO2: 99%   General: Well Developed, well nourished, and in no acute distress.  Neuro/Psych: Alert and oriented x3, extra-ocular muscles intact, able to move all 4 extremities, sensation grossly intact. Skin: Warm and dry, no rashes noted.  Respiratory: Not using accessory muscles, speaking in full sentences, trachea midline.  Cardiovascular: Pulses palpable, no extremity edema. Abdomen: Does not appear distended. MSK: C-spine normal. Nontender midline. Tender palpation right cervical paraspinal musculature. Normal cervical motion. Negative Spurling's test.  Right  shoulder normal-appearing nontender. Normal passive range of motion.  Abduction active limited 100 degrees.  Normal external rotation. Internal rotation limited to lumbar spine.  Strength within limits of range of motion abduction 4/5 external rotation 4/5 internal rotation 5/5.  Negative Hawkins test positive Neer's test.  Positive empty can test.  Negative Yergason's and speeds test.  Pulses cap refill and sensation are intact distally.  Reflexes are intact distally.  Lab and Radiology Results Diagnostic Limited MSK Ultrasound of: Right shoulder Biceps tendon intact normal-appearing Subscapularis tendon intact normal-appearing Supraspinatus tendon appears to be intact with increased hypoechoic fluid tracking superficial to the tendon consistent with bursitis. Infraspinatus tendon intact there is normal. AC joint normal. Impression: Subacromial bursitis  Procedure: Real-time Ultrasound Guided Injection of right shoulder subacromial bursa Device: Philips Affiniti 50G Images permanently stored and available for review in PACS Verbal informed consent obtained.  Discussed risks and benefits of procedure. Warned about infection bleeding damage to structures skin hypopigmentation and fat atrophy among others. Patient expresses understanding and agreement Time-out conducted.   Noted no overlying erythema, induration, or other signs of local infection.   Skin prepped in a sterile fashion.   Local anesthesia: Topical Ethyl chloride.   With sterile technique and under real time ultrasound guidance:  40 mg of Kenalog and 2 mL of Marcaine injected easily.   Completed without difficulty   Pain partially resolved suggesting accurate placement of the medication.   Advised to call if fevers/chills, erythema, induration, drainage, or persistent bleeding.   Images permanently stored and available for review in the ultrasound unit.  Impression: Technically successful ultrasound guided  injection.        Impression and Recommendations:    Assessment  and Plan: 34 y.o. female with right shoulder pain multifactorial.  Most dominant finding is rotator cuff tendinopathy/subacromial bursitis.  She had moderate partial pain relief following injection immediately indicating that she has more than 1 pain generator.  Plan for injection and physical therapy.  Patient may also have a component of cervical radiculopathy.  She would like to avoid oral steroids which is reasonable.  Plan for steroid injection in the shoulder as above as well as a trial of low-dose gabapentin and physical therapy.  She also has some trapezius spasm.  This should be amenable to physical therapy as well..   Orders Placed This Encounter  Procedures  . [place] LIMITED JOINT SPACE STRUCTURES UP RIGHT(NO LINKED CHARGES)    Order Specific Question:   Reason for Exam (SYMPTOM  OR DIAGNOSIS REQUIRED)    Answer:   shoulder    Order Specific Question:   Preferred imaging location?    Answer:   Adult nurse [facility]  . Ambulatory referral to Physical Therapy    Referral Priority:   Routine    Referral Type:   Physical Medicine    Referral Reason:   Specialty Services Required    Requested Specialty:   Physical Therapy   Meds ordered this encounter  Medications  . gabapentin (NEURONTIN) 100 MG capsule    Sig: Take 1-3 capsules (100-300 mg total) by mouth 3 (three) times daily as needed.    Dispense:  90 capsule    Refill:  3    Discussed warning signs or symptoms. Please see discharge instructions. Patient expresses understanding.   The above documentation has been reviewed and is accurate and complete Clementeen Graham, M.D.

## 2019-11-30 NOTE — Patient Instructions (Signed)
Thank you for coming in today. Plan for PT.  Take gabapentin as needed up to 3x daily.  Call or go to the ER if you develop a large red swollen joint with extreme pain or oozing puss.  If needed let me know and I will prescribe prednisone.    Shoulder Impingement Syndrome  Shoulder impingement syndrome is a condition that causes pain when connective tissues (tendons) surrounding the shoulder joint become pinched. These tendons are part of the group of muscles and tissues that help to stabilize the shoulder (rotator cuff). Beneath the rotator cuff is a fluid-filled sac (bursa) that allows the muscles and tendons to glide smoothly. The bursa may become swollen or irritated (bursitis). Bursitis, swelling in the rotator cuff tendons, or both conditions can decrease how much space is under a bone in the shoulder joint (acromion), resulting in impingement. What are the causes? Shoulder impingement syndrome may be caused by bursitis or swelling of the rotator cuff tendons, which may result from:  Repetitive overhead arm movements.  Falling onto the shoulder.  Weakness in the shoulder muscles. What increases the risk? You may be more likely to develop this condition if you:  Play sports that involve throwing, such as baseball.  Participate in sports such as tennis, volleyball, and swimming.  Work as a Education administrator, Music therapist, or Pharmacologist. Some people are also more likely to develop impingement syndrome because of the shape of their acromion bone. What are the signs or symptoms? The main symptom of this condition is pain on the front or side of the shoulder. The pain may:  Get worse when lifting or raising the arm.  Get worse at night.  Wake you up from sleeping.  Feel sharp when the shoulder is moved and then fade to an ache. Other symptoms may include:  Tenderness.  Stiffness.  Inability to raise the arm above shoulder level or behind the body.  Weakness. How is this  diagnosed? This condition may be diagnosed based on:  Your symptoms and medical history.  A physical exam.  Imaging tests, such as: ? X-rays. ? MRI. ? Ultrasound. How is this treated? This condition may be treated by:  Resting your shoulder and avoiding all activities that cause pain or put stress on the shoulder.  Icing your shoulder.  NSAIDs to help reduce pain and swelling.  One or more injections of medicines to numb the area and reduce inflammation.  Physical therapy.  Surgery. This may be needed if nonsurgical treatments have not helped. Surgery may involve repairing the rotator cuff, reshaping the acromion, or removing the bursa. Follow these instructions at home: Managing pain, stiffness, and swelling   If directed, put ice on the injured area. ? Put ice in a plastic bag. ? Place a towel between your skin and the bag. ? Leave the ice on for 20 minutes, 2-3 times a day. Activity  Rest and return to your normal activities as told by your health care provider. Ask your health care provider what activities are safe for you.  Do exercises as told by your health care provider. General instructions  Do not use any products that contain nicotine or tobacco, such as cigarettes, e-cigarettes, and chewing tobacco. These can delay healing. If you need help quitting, ask your health care provider.  Ask your health care provider when it is safe for you to drive.  Take over-the-counter and prescription medicines only as told by your health care provider.  Keep all follow-up visits as told  by your health care provider. This is important. How is this prevented?  Give your body time to rest between periods of activity.  Be safe and responsible while being active. This will help you avoid falls.  Maintain physical fitness, including strength and flexibility. Contact a health care provider if:  Your symptoms have not improved after 1-2 months of treatment and rest.  You  cannot lift your arm away from your body. Summary  Shoulder impingement syndrome is a condition that causes pain when connective tissues (tendons) surrounding the shoulder joint become pinched.  The main symptom of this condition is pain on the front or side of the shoulder.  This condition is usually treated with rest, ice, and pain medicines as needed. This information is not intended to replace advice given to you by your health care provider. Make sure you discuss any questions you have with your health care provider. Document Revised: 07/09/2018 Document Reviewed: 09/09/2017 Elsevier Patient Education  2020 [employer]    Cervical Radiculopathy  Cervical radiculopathy means that a nerve in the neck (a cervical nerve) is pinched or bruised. This can happen because of an injury to the cervical spine (vertebrae) in the neck, or as a normal part of getting older. This can cause pain or loss of feeling (numbness) that runs from your neck all the way down to your arm and fingers. Often, this condition gets better with rest. Treatment may be needed if the condition does not get better. What are the causes?  A neck injury.  A bulging disk in your spine.  Muscle movements that you cannot control (muscle spasms).  Tight muscles in your neck due to overuse.  Arthritis.  Breakdown in the bones and joints of the spine (spondylosis) due to getting older.  Bone spurs that form near the nerves in the neck. What are the signs or symptoms?  Pain. The pain may: ? Run from the neck to the arm and hand. ? Be very bad or irritating. ? Be worse when you move your neck.  Loss of feeling or tingling in your arm or hand.  Weakness in your arm or hand, in very bad cases. How is this treated? In many cases, treatment is not needed for this condition. With rest, the condition often gets better over time. If treatment is needed, options may include:  Wearing a soft neck collar (cervical  collar) for short periods of time, as told by your doctor.  Doing exercises (physical therapy) to strengthen your neck muscles.  Taking medicines.  Having shots (injections) in your spine, in very bad cases.  Having surgery. This may be needed if other treatments do not help. The type of surgery that is used depends on the cause of your condition. Follow these instructions at home: If you have a soft neck collar:  Wear it as told by your doctor. Remove it only as told by your doctor.  Ask your doctor if you can remove the collar for cleaning and bathing. If you are allowed to remove the collar for cleaning or bathing: ? Follow instructions from your doctor about how to remove the collar safely. ? Clean the collar by wiping it with mild soap and water and drying it completely. ? Take out any removable pads in the collar every 1-2 days. Wash them by hand with soap and water. Let them air-dry completely before you put them back in the collar. ? Check your skin under the collar for redness or sores. If  you see any, tell your doctor. Managing pain      Take over-the-counter and prescription medicines only as told by your doctor.  If told, put ice on the painful area. ? If you have a soft neck collar, remove it as told by your doctor. ? Put ice in a plastic bag. ? Place a towel between your skin and the bag. ? Leave the ice on for 20 minutes, 2-3 times a day.  If using ice does not help, you can try using heat. Use the heat source that your doctor recommends, such as a moist heat pack or a heating pad. ? Place a towel between your skin and the heat source. ? Leave the heat on for 20-30 minutes. ? Remove the heat if your skin turns bright red. This is very important if you are unable to feel pain, heat, or cold. You may have a greater risk of getting burned.  You may try a gentle neck and shoulder rub (massage). Activity  Rest as needed.  Return to your normal activities as told by  your doctor. Ask your doctor what activities are safe for you.  Do exercises as told by your doctor or physical therapist.  Do not lift anything that is heavier than 10 lb (4.5 kg) until your doctor tells you that it is safe. General instructions  Use a flat pillow when you sleep.  Do not drive while wearing a soft neck collar. If you do not have a soft neck collar, ask your doctor if it is safe to drive while your neck heals.  Ask your doctor if the medicine prescribed to you requires you to avoid driving or using heavy machinery.  Do not use any products that contain nicotine or tobacco, such as cigarettes, e-cigarettes, and chewing tobacco. These can delay healing. If you need help quitting, ask your doctor.  Keep all follow-up visits as told by your doctor. This is important. Contact a doctor if:  Your condition does not get better with treatment. Get help right away if:  Your pain gets worse and is not helped with medicine.  You lose feeling or feel weak in your hand, arm, face, or leg.  You have a high fever.  You have a stiff neck.  You cannot control when you poop or pee (have incontinence).  You have trouble with walking, balance, or talking. Summary  Cervical radiculopathy means that a nerve in the neck is pinched or bruised.  A nerve can get pinched from a bulging disk, arthritis, an injury to the neck, or other causes.  Symptoms include pain, tingling, or loss of feeling that goes from the neck into the arm or hand.  Weakness in your arm or hand can happen in very bad cases.  Treatment may include resting, wearing a soft neck collar, and doing exercises. You might need to take medicines for pain. In very bad cases, shots or surgery may be needed. This information is not intended to replace advice given to you by your health care provider. Make sure you discuss any questions you have with your health care provider. Document Revised: 02/05/2018 Document Reviewed:  02/05/2018 Elsevier Patient Education  2020 [employer].

## 2019-11-30 NOTE — Progress Notes (Signed)
Subjective:  Patient ID: Katie Green, female    DOB: 1986/03/11  Age: 34 y.o. MRN: [id number]  CC: Acute Visit (Pt c/o right side neck pain that is traveling into her arm x9 days. Has experienced some numbness and tingling in her forearm and hand but it has went away and now she only has the pain. )  Shoulder Pain  The pain is present in the right shoulder and neck. This is a new problem. The current episode started 1 to 4 weeks ago. There has been no history of extremity trauma. The problem occurs constantly. The problem has been gradually worsening. The quality of the pain is described as aching and pounding. Associated symptoms include an inability to bear weight, joint locking, a limited range of motion and stiffness. Pertinent negatives include no fever. Associated symptoms comments: Resolved numbness and tingling of right forearm. The symptoms are aggravated by activity and lying down. She has tried NSAIDS, acetaminophen, rest, cold and heat for the symptoms. The treatment provided no relief. Family history does not include gout or rheumatoid arthritis. There is no history of diabetes, gout, osteoarthritis or rheumatoid arthritis.   Reviewed past Medical, Social and Family history today.  Outpatient Medications Prior to Visit  Medication Sig Dispense Refill  . amphetamine-dextroamphetamine (ADDERALL) 20 MG tablet TAKE 1 TABLET BY MOUTH EVERY MORNING, AT NOON, AND AT 4PM  0  . citalopram (CELEXA) 40 MG tablet Take 1 tablet (40 mg total) by mouth daily. 90 tablet 1  . ferrous sulfate 325 (65 FE) MG EC tablet Take 325 mg by mouth daily with supper.     Colleen Can 1/20 1-20 MG-MCG tablet TAKE 1 TABLET BY MOUTH  DAILY 63 tablet 4  . LORazepam (ATIVAN) 1 MG tablet Take 1 mg by mouth every 8 (eight) hours.    Marland Kitchen spironolactone (ALDACTONE) 50 MG tablet Take 50 mg by mouth daily.    . vitamin B-12 (CYANOCOBALAMIN) 1000 MCG tablet Take 1,000 mcg by mouth daily.     . norethindrone-ethinyl estradiol  (LOESTRIN FE) 1-20 MG-MCG tablet Take 1 tablet by mouth daily. (Patient not taking: Reported on 11/30/2019) 3 Package 4  . valACYclovir (VALTREX) 500 MG tablet Take 1 tablet (500 mg total) by mouth 2 (two) times daily. With outbreak (Patient not taking: Reported on 11/30/2019) 30 tablet 0   No facility-administered medications prior to visit.    ROS See HPI  Objective:  BP 114/70 (BP Location: Left Arm, Patient Position: Sitting, Cuff Size: Normal)   Pulse 99   Temp 97.7 F (36.5 C) (Temporal)   Ht 5\' 3"  (1.6 m)   Wt 93 lb 6.4 oz (42.4 kg)   SpO2 98%   BMI 16.55 kg/m   Physical Exam Vitals reviewed.  Musculoskeletal:        General: Tenderness present.     Right shoulder: Tenderness present. No effusion. Decreased range of motion. Normal strength. Normal pulse.     Right upper arm: Normal.     Right elbow: Normal.     Right forearm: Normal.     Right wrist: Normal.     Right hand: Normal.     Cervical back: No erythema, rigidity or spasms. No pain with movement. Normal range of motion.     Comments: Unable to raise right arm above chest level  Skin:    General: Skin is warm and dry.     Findings: No erythema or rash.  Neurological:     Mental Status: She  is alert and oriented to person, place, and time.    Assessment & Plan:  This visit occurred during the SARS-CoV-2 public health emergency.  Safety protocols were in place, including screening questions prior to the visit, additional usage of staff PPE, and extensive cleaning of exam room while observing appropriate contact time as indicated for disinfecting solutions.   Sukari was seen today for acute visit.  Diagnoses and all orders for this visit:  Adhesive capsulitis of right shoulder -     Ambulatory referral to Sports Medicine -     traMADol (ULTRAM) 50 MG tablet; Take 1 tablet (50 mg total) by mouth every 8 (eight) hours as needed for up to 3 days. -     diclofenac Sodium (VOLTAREN) 1 % GEL; Apply 2 g topically 4  (four) times daily.   Problem List Items Addressed This Visit    None    Visit Diagnoses    Adhesive capsulitis of right shoulder    -  Primary   Relevant Medications   traMADol (ULTRAM) 50 MG tablet   diclofenac Sodium (VOLTAREN) 1 % GEL   Other Relevant Orders   Ambulatory referral to Sports Medicine      Follow-up: No follow-ups on file.  Alysia Penna, NP

## 2019-11-30 NOTE — Patient Instructions (Signed)
Go for appt with Dr. Denyse Amass today at 3:45pm [address] [facility] [address] 1st floor.  Adhesive Capsulitis  Adhesive capsulitis, also called frozen shoulder, causes the shoulder to become stiff and painful to move. This condition happens when there is inflammation of the tendons and ligaments that surround the shoulder joint (shoulder capsule). What are the causes? This condition may be caused by:  An injury to your shoulder joint.  Straining your shoulder.  Not moving your shoulder for a period of time. This can happen if your arm was injured or in a sling.  Long-standing conditions, such as: ? Diabetes. ? Thyroid problems. ? Heart disease. ? Stroke. ? Rheumatoid arthritis. ? Lung disease. In some cases, the cause is not known. What increases the risk? You are more likely to develop this condition if you are:  A woman.  Older than 36 years of age. What are the signs or symptoms? Symptoms of this condition include:  Pain in your shoulder when you move your arm. There may also be pain when parts of your shoulder are touched. The pain may be worse at night or when you are resting.  A sore or aching shoulder.  The inability to move your shoulder normally.  Muscle spasms. How is this diagnosed? This condition is diagnosed with a physical exam and imaging tests, such as an X-ray or MRI. How is this treated? This condition may be treated with:  Treatment of the underlying cause or condition.  Medicine. Medicine may be given to relieve pain, inflammation, or muscle spasms.  Steroid injections into the shoulder joint.  Physical therapy. This involves performing exercises to get the shoulder moving again.  Acupuncture. This is a type of treatment that involves stimulating specific points on your body by inserting thin needles through your skin.  Shoulder manipulation. This is a procedure to move the shoulder into another position. It is done after you are given a medicine to make  you fall asleep (general anesthetic). The joint may also be injected with salt water at high pressure to break down scarring.  Surgery. This may be done in severe cases when other treatments have failed. Although most people recover completely from adhesive capsulitis, some may not regain full shoulder movement. Follow these instructions at home: Managing pain, stiffness, and swelling      If directed, put ice on the injured area: ? Put ice in a plastic bag. ? Place a towel between your skin and the bag. ? Leave the ice on for 20 minutes, 2-3 times per day.  If directed, apply heat to the affected area before you exercise. Use the heat source that your health care provider recommends, such as a moist heat pack or a heating pad. ? Place a towel between your skin and the heat source. ? Leave the heat on for 20-30 minutes. ? Remove the heat if your skin turns bright red. This is especially important if you are unable to feel pain, heat, or cold. You may have a greater risk of getting burned. General instructions  Take over-the-counter and prescription medicines only as told by your health care provider.  If you are being treated with physical therapy, follow instructions from your physical therapist.  Avoid exercises that put a lot of demand on your shoulder, such as throwing. These exercises can make pain worse.  Keep all follow-up visits as told by your health care provider. This is important. Contact a health care provider if:  You develop new symptoms.  Your  symptoms get worse. Summary  Adhesive capsulitis, also called frozen shoulder, causes the shoulder to become stiff and painful to move.  You are more likely to have this condition if you are a woman and over age 55.  It is treated with physical therapy, medicines, and sometimes surgery. This information is not intended to replace advice given to you by your health care provider. Make sure you discuss any questions you  have with your health care provider. Document Revised: 08/21/2017 Document Reviewed: 08/21/2017 Elsevier Patient Education  2020 [employer].

## 2019-12-02 ENCOUNTER — Encounter: Payer: Self-pay | Admitting: Nurse Practitioner

## 2019-12-02 ENCOUNTER — Encounter: Payer: Self-pay | Admitting: Family Medicine

## 2019-12-02 DIAGNOSIS — M25511 Pain in right shoulder: Secondary | ICD-10-CM

## 2019-12-02 DIAGNOSIS — M7501 Adhesive capsulitis of right shoulder: Secondary | ICD-10-CM

## 2019-12-09 MED ORDER — TRAMADOL HCL 50 MG PO TABS: 50.0000 mg | ORAL_TABLET | Freq: Three times a day (TID) | ORAL | 0 refills | Status: DC | PRN

## 2019-12-09 NOTE — Addendum Note (Signed)
Addended by: Rodolph Bong on: 12/09/2019 07:32 AM   Modules accepted: Orders

## 2019-12-18 ENCOUNTER — Other Ambulatory Visit: Payer: Self-pay | Admitting: Family Medicine

## 2019-12-18 DIAGNOSIS — M7501 Adhesive capsulitis of right shoulder: Secondary | ICD-10-CM

## 2019-12-23 MED ORDER — PREDNISONE 50 MG PO TABS: 50.0000 mg | ORAL_TABLET | Freq: Every day | ORAL | 0 refills | Status: DC

## 2019-12-23 NOTE — Addendum Note (Signed)
Addended by: Rodolph Bong on: 12/23/2019 01:18 PM   Modules accepted: Orders

## 2019-12-26 ENCOUNTER — Telehealth: Payer: Self-pay | Admitting: Physical Therapy

## 2019-12-26 DIAGNOSIS — M7501 Adhesive capsulitis of right shoulder: Secondary | ICD-10-CM

## 2019-12-26 MED ORDER — TRAMADOL HCL 50 MG PO TABS: 50.0000 mg | ORAL_TABLET | Freq: Three times a day (TID) | ORAL | 0 refills | Status: DC | PRN

## 2019-12-26 NOTE — Addendum Note (Signed)
Addended by: Rodolph Bong on: 12/26/2019 04:44 PM   Modules accepted: Orders

## 2019-12-26 NOTE — Addendum Note (Signed)
Addended by: Rodolph Bong on: 12/26/2019 12:51 PM   Modules accepted: Orders

## 2019-12-26 NOTE — Telephone Encounter (Signed)
Pt returned to [place] from the beach yesterday so needs Tramadol rx sent to [facility] [address] as she never got the rx that was sent to [facility].

## 2019-12-26 NOTE — Telephone Encounter (Signed)
Tramadol sent to local [facility]

## 2020-01-09 ENCOUNTER — Encounter: Payer: Self-pay | Admitting: Physical Therapy

## 2020-01-09 ENCOUNTER — Ambulatory Visit: Payer: 59 | Admitting: Physical Therapy

## 2020-01-09 ENCOUNTER — Other Ambulatory Visit: Payer: Self-pay

## 2020-01-09 DIAGNOSIS — M25511 Pain in right shoulder: Secondary | ICD-10-CM

## 2020-01-09 DIAGNOSIS — M6281 Muscle weakness (generalized): Secondary | ICD-10-CM

## 2020-01-09 NOTE — Patient Instructions (Signed)
Access Code: [email or web address] URL: [email or web address] Date: 01/09/2020 Prepared by: Sedalia Muta  Exercises Supine Shoulder Flexion with Dowel - 2 x daily - 2 sets - 10 reps Supine Shoulder Flexion Extension Full Range AROM - 2 x daily - 1 sets - 10 reps Sidelying Shoulder External Rotation - 2 x daily - 1 sets - 10 reps Standing Row with Anchored Resistance - 2 x daily - 1-2 sets - 10 reps

## 2020-01-09 NOTE — Therapy (Signed)
[facility] [address] [place], [place], [address] Phone: [phone]   Fax:  [phone]  Physical Therapy Evaluation  Patient Details  Name: Katie Green MRN: [id number] Date of Birth: 1985/10/22 Referring Provider (PT): Clementeen Graham   Encounter Date: 01/09/2020   PT End of Session - 01/09/20 0827    Visit Number 1    Number of Visits 12    Date for PT Re-Evaluation 02/20/20    Authorization Type UHC    PT Start Time 0808    PT Stop Time 0845    PT Time Calculation (min) 37 min    Activity Tolerance Patient tolerated treatment well    Behavior During Therapy [facility] for tasks assessed/performed           Past Medical History:  Diagnosis Date  . Anemia   . Anxiety   . Arthritis   . Cervical dysplasia    LEEP for high grade 03/2010 margins clear, lgsil pap/C&B 09/2010, ASCUS neg HR HPV pap 04/2011  . Complex regional pain syndrome    Left foot  . Depression   . HSV-2 (herpes simplex virus 2) infection   . Kidney stones   . Nephrolithiasis 07/29/2016   Confirmed multiple stones via CT scan    Past Surgical History:  Procedure Laterality Date  . CERVICAL BIOPSY  W/ LOOP ELECTRODE EXCISION  03/2010   high grade with free margins  . GUM SURGERY      There were no vitals filed for this visit.    Subjective Assessment - 01/09/20 0815    Subjective Pt states increased pain in R side of neck at end of August. States pain down her arm, that is now resolved. States inability to lift/raise arm up at that time as well. Pain now still located in R shoulder, did have injection on 9/1. States mild pain, but most botherosme is weakness, inability to use/lift arm. 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 Yes    Pain Score 3     Pain Location Shoulder    Pain Orientation Right     Pain Descriptors / Indicators Aching    Pain Type Acute pain    Pain Onset More than a month ago    Pain Frequency Intermittent    Aggravating Factors  lifting, elevation, use    Pain Relieving Factors rest              [facility] PT Assessment - 01/09/20 0001      Assessment   Medical Diagnosis R shoulder / neck pain    Referring Provider (PT) Clementeen Graham    Hand Dominance Right    Prior Therapy no      Balance Screen   Has the patient fallen in the past 6 months No      Prior Function   Level of Independence Independent      Cognition   Overall Cognitive Status Within Functional Limits for tasks assessed      ROM / Strength   AROM / PROM / Strength AROM;PROM;Strength      AROM   Overall AROM Comments Cervical: WNL    AROM Assessment Site Shoulder    Right/Left Shoulder Right    Right Shoulder Flexion 120 Degrees    Right Shoulder ABduction 90 Degrees    Right Shoulder  Internal Rotation --   wfl   Right Shoulder External Rotation --   wfl     PROM   Overall PROM Comments PROM: R shoulder: WNL      Strength   Overall Strength Comments Elbow: flex: 4+/5, ext: 4/5     Strength Assessment Site Shoulder    Right/Left Shoulder Right    Right Shoulder Flexion 3-/5    Right Shoulder ABduction 3-/5    Right Shoulder Internal Rotation 4/5    Right Shoulder External Rotation 4/5      Palpation   Palpation comment Mild muscle tension and soreness in R UT and posterior shoulder ; Mild soreness with palpation of mid/lower cervical with PAs       Special Tests   Other special tests Spurlings, Neg;  No cervical radiculopathy                      Objective measurements completed on examination: See above findings.       [facility] Adult PT Treatment/Exercise - 01/09/20 0001      Exercises   Exercises Shoulder      Shoulder Exercises: Supine   Flexion AAROM;20 reps    Flexion Limitations cane      Shoulder Exercises: Sidelying   External Rotation 15  reps;AROM      Shoulder Exercises: Standing   Row 20 reps    Theraband Level (Shoulder Row) Level 3 Chilton Si)                  PT Education - 01/09/20 1253    Education Details PT POC, Exam findings, HEP    Person(s) Educated Patient    Methods Explanation;Demonstration;Tactile cues;Verbal cues;Handout    Comprehension Verbalized understanding;Returned demonstration;Verbal cues required;Tactile cues required;Need further instruction            PT Short Term Goals - 01/09/20 1458      PT SHORT TERM GOAL #1   Title Pt to be independent wtih initial HEP    Time 2    Period Weeks    Status New    Target Date 01/23/20      PT SHORT TERM GOAL #2   Title Pt to demo improved flexion AROM by at least 20 deg    Time 3    Period Weeks    Status New    Target Date 01/30/20             PT Long Term Goals - 01/09/20 1455      PT LONG TERM GOAL #1   Title Pt to be independent with final HEP    Time 6    Period Weeks    Status New    Target Date 02/20/20      PT LONG TERM GOAL #2   Title Pt to demo improved AROM of R shoulder to be WNL to improve ability for work duties and IADLs.    Time 6    Period Weeks    Status New    Target Date 02/20/20      PT LONG TERM GOAL #3   Title Pt to demo improved strength of R shoulder, to at least 4+/5 for all motions, to improve ability for reaching, lifting, carrying and IADLs.    Time 6    Period Weeks    Status New    Target Date 02/20/20      PT LONG TERM GOAL #4   Title Pt to report decreased pain  in R shoulder, to 0-2/10 with activity    Time 6    Period Weeks    Status New    Target Date 02/20/20                  Plan - 01/09/20 1458    Clinical Impression Statement Pt presents with primary complaint of increased pain in R shoulder. She has decreased ability for AROM, due to weakness in UE. She has decreased ability for full functional use of R UE, lifitng, reaching, carrying, work duties and IADLs. She  has minimal cervical symptoms, but will also continue to monitor neck for source of pain/cervical radiculopathy, as she did have symptoms of this at onset. Pt to benefit from skilled PT to improve deficits and pain.    Examination-Activity Limitations Bathing;Locomotion Level;Reach Overhead;Sleep;Lift;Dressing    Examination-Participation Restrictions Meal Prep;Cleaning;Occupation;Community Activity;Driving;Shop;Laundry;Yard Work    Stability/Clinical Decision Making Stable/Uncomplicated    Clinical Decision Making Low    Rehab Potential Good    PT Frequency 2x / week    PT Duration 6 weeks    PT Treatment/Interventions ADLs/Self Care Home Management;Cryotherapy;Electrical Stimulation;DME Instruction;Ultrasound;Traction;Moist Heat;Iontophoresis 4mg /ml Dexamethasone;Functional mobility training;Therapeutic activities;Therapeutic exercise;Neuromuscular re-education;Manual techniques;Patient/family education;Passive range of motion;Dry needling;Splinting;Taping;Joint Manipulations;Spinal Manipulations;Vasopneumatic Device    Consulted and Agree with Plan of Care Patient           Patient will benefit from skilled therapeutic intervention in order to improve the following deficits and impairments:  Decreased range of motion, Increased muscle spasms, Impaired UE functional use, Decreased activity tolerance, Pain, Impaired flexibility, Decreased strength, Decreased mobility, Decreased balance  Visit Diagnosis: Acute pain of right shoulder  Muscle weakness (generalized)     Problem List Patient Active Problem List   Diagnosis Date Noted  . Cervical dysplasia 11/08/2016  . Anxiety 09/01/2016  . Complex regional pain syndrome 07/22/2012  . Depression 10/01/2011  . Nicotine dependence 07/02/2011  . Foot pain, left 07/01/2011  . 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 HSV-2 (herpes simplex virus 2) infection     08/31/2011, PT, DPT 4:35 PM  01/09/20    [facility] [address] [place], [place], [place] Phone: [phone]   Fax:  [phone]  Name: GRABIELA WOHLFORD MRN: Carleene Mains Date of Birth: 01-18-1986

## 2020-01-18 ENCOUNTER — Other Ambulatory Visit: Payer: Self-pay | Admitting: Family Medicine

## 2020-01-18 DIAGNOSIS — M7501 Adhesive capsulitis of right shoulder: Secondary | ICD-10-CM

## 2020-01-19 ENCOUNTER — Encounter: Payer: Self-pay | Admitting: Physical Therapy

## 2020-01-19 ENCOUNTER — Ambulatory Visit: Payer: 59 | Admitting: Physical Therapy

## 2020-01-19 ENCOUNTER — Other Ambulatory Visit: Payer: Self-pay

## 2020-01-19 DIAGNOSIS — M25511 Pain in right shoulder: Secondary | ICD-10-CM | POA: Diagnosis not present

## 2020-01-19 DIAGNOSIS — M6281 Muscle weakness (generalized): Secondary | ICD-10-CM

## 2020-01-23 ENCOUNTER — Encounter: Payer: 59 | Admitting: Physical Therapy

## 2020-01-23 ENCOUNTER — Encounter: Payer: Self-pay | Admitting: Physical Therapy

## 2020-01-23 NOTE — Therapy (Signed)
[facility] [address] [place], [place], [address] Phone: [phone]   Fax:  [phone]  Physical Therapy Treatment  Patient Details  Name: Katie Green MRN: [id number] Date of Birth: 23-Apr-1985 Referring Provider (PT): Clementeen Graham   Encounter Date: 01/19/2020   PT End of Session - 01/23/20 0837    Visit Number 2    Number of Visits 12    Date for PT Re-Evaluation 02/20/20    Authorization Type UHC    PT Start Time 1602    PT Stop Time 1643    PT Time Calculation (min) 41 min    Activity Tolerance Patient tolerated treatment well    Behavior During Therapy [facility] for tasks assessed/performed           Past Medical History:  Diagnosis Date  . Anemia   . Anxiety   . Arthritis   . Cervical dysplasia    LEEP for high grade 03/2010 margins clear, lgsil pap/C&B 09/2010, ASCUS neg HR HPV pap 04/2011  . Complex regional pain syndrome    Left foot  . Depression   . HSV-2 (herpes simplex virus 2) infection   . Kidney stones   . Nephrolithiasis 07/29/2016   Confirmed multiple stones via CT scan    Past Surgical History:  Procedure Laterality Date  . CERVICAL BIOPSY  W/ LOOP ELECTRODE EXCISION  03/2010   high grade with free margins  . GUM SURGERY      There were no vitals filed for this visit.   Subjective Assessment - 01/23/20 0837    Subjective Pt states some improvements in shoulder. Was able to hold blow dryer and dry hair for first time in several months.    Limitations Lifting;Standing;House hold activities    Patient Stated Goals improved strength, use of R arm, improved pain    Currently in Pain? Yes    Pain Score 2     Pain Location Shoulder    Pain Orientation Right    Pain Descriptors / Indicators Aching    Pain Type Acute pain    Pain Onset More than a month ago    Pain Frequency Intermittent                             [facility] Adult PT Treatment/Exercise - 01/23/20 0001       Exercises   Exercises Shoulder      Shoulder Exercises: Supine   External Rotation 20 reps    External Rotation Weight (lbs) 2    Flexion AAROM;20 reps    Flexion Limitations cane    Other Supine Exercises chest press 3 lb x 20;     Other Supine Exercises flexion AROM x 15;       Shoulder Exercises: Sidelying   External Rotation 15 reps;AROM      Shoulder Exercises: Standing   Row 20 reps    Theraband Level (Shoulder Row) Level 3 (Green)    Other Standing Exercises flexion AROM x 10       Shoulder Exercises: Pulleys   Flexion 2 minutes    ABduction 2 minutes      Shoulder Exercises: ROM/Strengthening   Wall Wash x10 1 UE       Manual Therapy   Manual Therapy Joint mobilization;Passive ROM;Soft tissue mobilization    Manual therapy comments skilled palpation and monitoring of soft tissue with dry needling     Joint Mobilization post and  inf mobs R GHJ    Soft tissue mobilization R UT     Passive ROM R shoulder all motios             Trigger Point Dry Needling - 01/23/20 0001    Consent Given? Yes    Education Handout Provided Yes    Muscles Treated Head and Neck Upper trapezius    Upper Trapezius Response Twitch reponse elicited;Palpable increased muscle length   R                 PT Short Term Goals - 01/09/20 1458      PT SHORT TERM GOAL #1   Title Pt to be independent wtih initial HEP    Time 2    Period Weeks    Status New    Target Date 01/23/20      PT SHORT TERM GOAL #2   Title Pt to demo improved flexion AROM by at least 20 deg    Time 3    Period Weeks    Status New    Target Date 01/30/20             PT Long Term Goals - 01/09/20 1455      PT LONG TERM GOAL #1   Title Pt to be independent with final HEP    Time 6    Period Weeks    Status New    Target Date 02/20/20      PT LONG TERM GOAL #2   Title Pt to demo improved AROM of R shoulder to be WNL to improve ability for work duties and IADLs.    Time 6    Period Weeks     Status New    Target Date 02/20/20      PT LONG TERM GOAL #3   Title Pt to demo improved strength of R shoulder, to at least 4+/5 for all motions, to improve ability for reaching, lifting, carrying and IADLs.    Time 6    Period Weeks    Status New    Target Date 02/20/20      PT LONG TERM GOAL #4   Title Pt to report decreased pain in R shoulder, to 0-2/10 with activity    Time 6    Period Weeks    Status New    Target Date 02/20/20                 Plan - 01/23/20 1610    Clinical Impression Statement Pt with mild improvments in ROM, and improved ability for ther ex and HEP. Pt continues to have limtiations for PROM and AROM, plan to progres as tolerated. Pt with good response to DN for pain /tightness in R UT today    Examination-Activity Limitations Bathing;Locomotion Level;Reach Overhead;Sleep;Lift;Dressing    Examination-Participation Restrictions Meal Prep;Cleaning;Occupation;Community Activity;Driving;Shop;Laundry;Yard Work    Stability/Clinical Decision Making Stable/Uncomplicated    Rehab Potential Good    PT Frequency 2x / week    PT Duration 6 weeks    PT Treatment/Interventions ADLs/Self Care Home Management;Cryotherapy;Electrical Stimulation;DME Instruction;Ultrasound;Traction;Moist Heat;Iontophoresis 4mg /ml Dexamethasone;Functional mobility training;Therapeutic activities;Therapeutic exercise;Neuromuscular re-education;Manual techniques;Patient/family education;Passive range of motion;Dry needling;Splinting;Taping;Joint Manipulations;Spinal Manipulations;Vasopneumatic Device    Consulted and Agree with Plan of Care Patient           Patient will benefit from skilled therapeutic intervention in order to improve the following deficits and impairments:  Decreased range of motion, Increased muscle spasms, Impaired UE functional use, Decreased activity tolerance, Pain, Impaired  flexibility, Decreased strength, Decreased mobility, Decreased balance  Visit  Diagnosis: Acute pain of right shoulder  Muscle weakness (generalized)     Problem List Patient Active Problem List   Diagnosis Date Noted  . Cervical dysplasia 11/08/2016  . Anxiety 09/01/2016  . Complex regional pain syndrome 07/22/2012  . Depression 10/01/2011  . Nicotine dependence 07/02/2011  . Foot pain, left 07/01/2011  . 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 HSV-2 (herpes simplex virus 2) infection     Sedalia Muta, PT, DPT 8:42 AM  01/23/20    [facility] [address] [place], [place], [address] Phone: [phone]   Fax:  [phone]  Name: Katie Green MRN: [id number] Date of Birth: 26-Dec-1985

## 2020-01-24 ENCOUNTER — Other Ambulatory Visit: Payer: Self-pay

## 2020-01-24 ENCOUNTER — Ambulatory Visit: Payer: 59 | Admitting: Physical Therapy

## 2020-01-24 ENCOUNTER — Encounter: Payer: Self-pay | Admitting: Physical Therapy

## 2020-01-24 DIAGNOSIS — M25511 Pain in right shoulder: Secondary | ICD-10-CM | POA: Diagnosis not present

## 2020-01-24 DIAGNOSIS — M6281 Muscle weakness (generalized): Secondary | ICD-10-CM | POA: Diagnosis not present

## 2020-01-24 NOTE — Therapy (Signed)
[facility] [address] [place], [place], [address] Phone: [phone]   Fax:  [phone]  Physical Therapy Treatment  Patient Details  Name: Katie Green MRN: [id number] Date of Birth: 26-Jan-1986 Referring Provider (PT): Clementeen Graham   Encounter Date: 01/24/2020   PT End of Session - 01/24/20 0814    Visit Number 3    Number of Visits 12    Date for PT Re-Evaluation 02/20/20    Authorization Type UHC    PT Start Time 0806    PT Stop Time 0845    PT Time Calculation (min) 39 min    Activity Tolerance Patient tolerated treatment well    Behavior During Therapy [facility] for tasks assessed/performed           Past Medical History:  Diagnosis Date  . Anemia   . Anxiety   . Arthritis   . Cervical dysplasia    LEEP for high grade 03/2010 margins clear, lgsil pap/C&B 09/2010, ASCUS neg HR HPV pap 04/2011  . Complex regional pain syndrome    Left foot  . Depression   . HSV-2 (herpes simplex virus 2) infection   . Kidney stones   . Nephrolithiasis 07/29/2016   Confirmed multiple stones via CT scan    Past Surgical History:  Procedure Laterality Date  . CERVICAL BIOPSY  W/ LOOP ELECTRODE EXCISION  03/2010   high grade with free margins  . GUM SURGERY      There were no vitals filed for this visit.   Subjective Assessment - 01/24/20 0813    Subjective Pt states less pain in shoulder.    Currently in Pain? Yes    Pain Score 3     Pain Location Shoulder    Pain Orientation Right    Pain Descriptors / Indicators Aching    Pain Type Acute pain    Pain Onset More than a month ago    Pain Frequency Intermittent              [facility] PT Assessment - 01/24/20 0001      AROM   Right Shoulder Flexion 155 Degrees    Right Shoulder ABduction 135 Degrees                         [facility] Adult PT Treatment/Exercise - 01/24/20 0812      Exercises   Exercises Shoulder      Shoulder Exercises: Supine    External Rotation 20 reps    External Rotation Weight (lbs) 2    Flexion --    Flexion Limitations --    Other Supine Exercises --    Other Supine Exercises flexion AROM x 15;       Shoulder Exercises: Sidelying   External Rotation 15 reps;AROM    External Rotation Weight (lbs) 1      Shoulder Exercises: Standing   External Rotation 15 reps    Theraband Level (Shoulder External Rotation) Level 1 (Yellow)    Internal Rotation 15 reps    Theraband Level (Shoulder Internal Rotation) Level 1 (Yellow)    Row 20 reps    Theraband Level (Shoulder Row) Level 3 (Green)    Other Standing Exercises flexion and abd ,  AROM x 10       Shoulder Exercises: Pulleys   Flexion 2 minutes    ABduction --      Shoulder Exercises: ROM/Strengthening   Wall Wash x10 1 UE  Wall Pushups 15 reps      Shoulder Exercises: Stretch   Corner Stretch 3 reps;30 seconds    Corner Stretch Limitations doorway 45 deg and 90 deg    Internal Rotation Stretch Limitations x10 behind back, stick, and IR 2 hands       Manual Therapy   Manual Therapy Joint mobilization;Passive ROM;Soft tissue mobilization    Manual therapy comments --    Joint Mobilization post and inf mobs R GHJ    Soft tissue mobilization --    Passive ROM R shoulder all motios                     PT Short Term Goals - 01/09/20 1458      PT SHORT TERM GOAL #1   Title Pt to be independent wtih initial HEP    Time 2    Period Weeks    Status New    Target Date 01/23/20      PT SHORT TERM GOAL #2   Title Pt to demo improved flexion AROM by at least 20 deg    Time 3    Period Weeks    Status New    Target Date 01/30/20             PT Long Term Goals - 01/09/20 1455      PT LONG TERM GOAL #1   Title Pt to be independent with final HEP    Time 6    Period Weeks    Status New    Target Date 02/20/20      PT LONG TERM GOAL #2   Title Pt to demo improved AROM of R shoulder to be WNL to improve ability for work  duties and IADLs.    Time 6    Period Weeks    Status New    Target Date 02/20/20      PT LONG TERM GOAL #3   Title Pt to demo improved strength of R shoulder, to at least 4+/5 for all motions, to improve ability for reaching, lifting, carrying and IADLs.    Time 6    Period Weeks    Status New    Target Date 02/20/20      PT LONG TERM GOAL #4   Title Pt to report decreased pain in R shoulder, to 0-2/10 with activity    Time 6    Period Weeks    Status New    Target Date 02/20/20                 Plan - 01/24/20 1210    Clinical Impression Statement Pt wtih much improved AROM for elevation. Focus on ROM and strengthening today, ER quite weak. Updated HEP to include strengthening for ER .    Examination-Activity Limitations Bathing;Locomotion Level;Reach Overhead;Sleep;Lift;Dressing    Examination-Participation Restrictions Meal Prep;Cleaning;Occupation;Community Activity;Driving;Shop;Laundry;Yard Work    Stability/Clinical Decision Making Stable/Uncomplicated    Rehab Potential Good    PT Frequency 2x / week    PT Duration 6 weeks    PT Treatment/Interventions ADLs/Self Care Home Management;Cryotherapy;Electrical Stimulation;DME Instruction;Ultrasound;Traction;Moist Heat;Iontophoresis 4mg /ml Dexamethasone;Functional mobility training;Therapeutic activities;Therapeutic exercise;Neuromuscular re-education;Manual techniques;Patient/family education;Passive range of motion;Dry needling;Splinting;Taping;Joint Manipulations;Spinal Manipulations;Vasopneumatic Device    Consulted and Agree with Plan of Care Patient           Patient will benefit from skilled therapeutic intervention in order to improve the following deficits and impairments:  Decreased range of motion, Increased muscle spasms, Impaired UE functional use,  Decreased activity tolerance, Pain, Impaired flexibility, Decreased strength, Decreased mobility, Decreased balance  Visit Diagnosis: Acute pain of right  shoulder  Muscle weakness (generalized)     Problem List Patient Active Problem List   Diagnosis Date Noted  . Cervical dysplasia 11/08/2016  . Anxiety 09/01/2016  . Complex regional pain syndrome 07/22/2012  . Depression 10/01/2011  . Nicotine dependence 07/02/2011  . Foot pain, left 07/01/2011  . 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 HSV-2 (herpes simplex virus 2) infection     Sedalia Muta, PT, DPT 12:12 PM  01/24/20     [facility] [address] [place], [place], [address] Phone: [phone]   Fax:  [phone]  Name: Katie Green MRN: [id number] Date of Birth: 05-03-1985

## 2020-01-26 ENCOUNTER — Encounter: Payer: 59 | Admitting: Physical Therapy

## 2020-01-30 ENCOUNTER — Other Ambulatory Visit: Payer: Self-pay

## 2020-01-30 ENCOUNTER — Encounter: Payer: Self-pay | Admitting: Physical Therapy

## 2020-01-30 ENCOUNTER — Ambulatory Visit: Payer: 59 | Admitting: Physical Therapy

## 2020-01-30 DIAGNOSIS — M25511 Pain in right shoulder: Secondary | ICD-10-CM

## 2020-01-30 DIAGNOSIS — M6281 Muscle weakness (generalized): Secondary | ICD-10-CM | POA: Diagnosis not present

## 2020-01-31 NOTE — Therapy (Signed)
[facility] [address] [place], [place], [address] Phone: [phone]   Fax:  [phone]  Physical Therapy Treatment  Patient Details  Name: Katie Green MRN: [id number] Date of Birth: 02-27-1986 Referring Provider (PT): Clementeen Graham   Encounter Date: 01/30/2020   PT End of Session - 01/30/20 1627    Visit Number 4    Number of Visits 12    Date for PT Re-Evaluation 02/20/20    Authorization Type UHC    PT Start Time 1616    PT Stop Time 1648    PT Time Calculation (min) 32 min    Activity Tolerance Patient tolerated treatment well    Behavior During Therapy [facility] for tasks assessed/performed           Past Medical History:  Diagnosis Date   Anemia    Anxiety    Arthritis    Cervical dysplasia    LEEP for high grade 03/2010 margins clear, lgsil pap/C&B 09/2010, ASCUS neg HR HPV pap 04/2011   Complex regional pain syndrome    Left foot   Depression    HSV-2 (herpes simplex virus 2) infection    Kidney stones    Nephrolithiasis 07/29/2016   Confirmed multiple stones via CT scan    Past Surgical History:  Procedure Laterality Date   CERVICAL BIOPSY  W/ LOOP ELECTRODE EXCISION  03/2010   high grade with free margins   GUM SURGERY      There were no vitals filed for this visit.   Subjective Assessment - 01/30/20 1618    Subjective Pt states improving shoulder pain. Pt 15 min late to appt today    Currently in Pain? Yes    Pain Score 2     Pain Location Shoulder    Pain Orientation Right    Pain Descriptors / Indicators Aching    Pain Type Acute pain    Pain Onset More than a month ago    Pain Frequency Intermittent                             [facility] Adult PT Treatment/Exercise - 01/31/20 0001      Exercises   Exercises Shoulder      Shoulder Exercises: Supine   External Rotation 20 reps    External Rotation Weight (lbs) 2    Diagonals 10 reps    Diagonals Limitations  manual resistancce D2    Other Supine Exercises flexion AROM x 15;       Shoulder Exercises: Prone   Other Prone Exercises Prone T x 15;       Shoulder Exercises: Sidelying   External Rotation 15 reps;AROM    External Rotation Weight (lbs) 1      Shoulder Exercises: Standing   External Rotation 15 reps    Theraband Level (Shoulder External Rotation) Level 2 (Red)    Internal Rotation 15 reps    Theraband Level (Shoulder Internal Rotation) Level 2 (Red)    Row 20 reps    Theraband Level (Shoulder Row) Level 3 (Green)    Other Standing Exercises flexion and abd ,  AROM x 10       Shoulder Exercises: Pulleys   Flexion 2 minutes      Shoulder Exercises: ROM/Strengthening   Wall Wash x10 1 UE     Wall Pushups 15 reps      Manual Therapy   Manual Therapy Joint mobilization;Passive ROM;Soft  tissue mobilization    Passive ROM R shoulder all motios                     PT Short Term Goals - 01/09/20 1458      PT SHORT TERM GOAL #1   Title Pt to be independent wtih initial HEP    Time 2    Period Weeks    Status New    Target Date 01/23/20      PT SHORT TERM GOAL #2   Title Pt to demo improved flexion AROM by at least 20 deg    Time 3    Period Weeks    Status New    Target Date 01/30/20             PT Long Term Goals - 01/09/20 1455      PT LONG TERM GOAL #1   Title Pt to be independent with final HEP    Time 6    Period Weeks    Status New    Target Date 02/20/20      PT LONG TERM GOAL #2   Title Pt to demo improved AROM of R shoulder to be WNL to improve ability for work duties and IADLs.    Time 6    Period Weeks    Status New    Target Date 02/20/20      PT LONG TERM GOAL #3   Title Pt to demo improved strength of R shoulder, to at least 4+/5 for all motions, to improve ability for reaching, lifting, carrying and IADLs.    Time 6    Period Weeks    Status New    Target Date 02/20/20      PT LONG TERM GOAL #4   Title Pt to report  decreased pain in R shoulder, to 0-2/10 with activity    Time 6    Period Weeks    Status New    Target Date 02/20/20                 Plan - 01/31/20 1320    Clinical Impression Statement Pt improving with pain and ability for AROM. Significant difference/limitation with active flexion with 1lb weight vs no weight today. Pt unable to raise past 95 deg in standing, but improved ability in supine.Pt will continue to benefit from strengthening .    Examination-Activity Limitations Bathing;Locomotion Level;Reach Overhead;Sleep;Lift;Dressing    Examination-Participation Restrictions Meal Prep;Cleaning;Occupation;Community Activity;Driving;Shop;Laundry;Yard Work    Stability/Clinical Decision Making Stable/Uncomplicated    Rehab Potential Good    PT Frequency 2x / week    PT Duration 6 weeks    PT Treatment/Interventions ADLs/Self Care Home Management;Cryotherapy;Electrical Stimulation;DME Instruction;Ultrasound;Traction;Moist Heat;Iontophoresis 4mg /ml Dexamethasone;Functional mobility training;Therapeutic activities;Therapeutic exercise;Neuromuscular re-education;Manual techniques;Patient/family education;Passive range of motion;Dry needling;Splinting;Taping;Joint Manipulations;Spinal Manipulations;Vasopneumatic Device    Consulted and Agree with Plan of Care Patient           Patient will benefit from skilled therapeutic intervention in order to improve the following deficits and impairments:  Decreased range of motion, Increased muscle spasms, Impaired UE functional use, Decreased activity tolerance, Pain, Impaired flexibility, Decreased strength, Decreased mobility, Decreased balance  Visit Diagnosis: Acute pain of right shoulder  Muscle weakness (generalized)     Problem List Patient Active Problem List   Diagnosis Date Noted   Cervical dysplasia 11/08/2016   Anxiety 09/01/2016   Complex regional pain syndrome 07/22/2012   Depression 10/01/2011   Nicotine dependence  07/02/2011   Foot pain,  left 07/01/2011   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   HSV-2 (herpes simplex virus 2) infection     Sedalia Muta, PT, DPT 1:23 PM  01/31/20    [place] [facility] [address] [place], [place], [address] Phone: [phone]   Fax:  [phone]  Name: DONELDA MAILHOT MRN: [id number] Date of Birth: [birth year]

## 2020-02-08 ENCOUNTER — Ambulatory Visit: Payer: 59 | Admitting: Physical Therapy

## 2020-02-08 ENCOUNTER — Other Ambulatory Visit: Payer: Self-pay

## 2020-02-08 ENCOUNTER — Encounter: Payer: Self-pay | Admitting: Physical Therapy

## 2020-02-08 DIAGNOSIS — M25511 Pain in right shoulder: Secondary | ICD-10-CM

## 2020-02-08 DIAGNOSIS — M6281 Muscle weakness (generalized): Secondary | ICD-10-CM

## 2020-02-08 NOTE — Therapy (Signed)
[facility] [address] [place], [place], [address] Phone: [phone]   Fax:  [phone]  Physical Therapy Treatment  Patient Details  Name: Katie Green MRN: [id number] Date of Birth: 06-20-1985 Referring Provider (PT): Clementeen Graham   Encounter Date: 02/08/2020   PT End of Session - 02/08/20 1649    Visit Number 5    Number of Visits 12    Date for PT Re-Evaluation 02/20/20    Authorization Type UHC    PT Start Time 1600    PT Stop Time 1648    PT Time Calculation (min) 48 min    Activity Tolerance Patient tolerated treatment well    Behavior During Therapy [facility] for tasks assessed/performed           Past Medical History:  Diagnosis Date   Anemia    Anxiety    Arthritis    Cervical dysplasia    LEEP for high grade 03/2010 margins clear, lgsil pap/C&B 09/2010, ASCUS neg HR HPV pap 04/2011   Complex regional pain syndrome    Left foot   Depression    HSV-2 (herpes simplex virus 2) infection    Kidney stones    Nephrolithiasis 07/29/2016   Confirmed multiple stones via CT scan    Past Surgical History:  Procedure Laterality Date   CERVICAL BIOPSY  W/ LOOP ELECTRODE EXCISION  03/2010   high grade with free margins   GUM SURGERY      There were no vitals filed for this visit.   Subjective Assessment - 02/08/20 1604    Subjective no new complaints.    Currently in Pain? Yes    Pain Score 2     Pain Location Shoulder    Pain Orientation Right    Pain Descriptors / Indicators Aching    Pain Type Acute pain    Pain Onset More than a month ago    Pain Frequency Intermittent                             [facility] Adult PT Treatment/Exercise - 02/08/20 1604      Exercises   Exercises Shoulder      Shoulder Exercises: Supine   Protraction 20 reps    Protraction Weight (lbs) 2    Horizontal ABduction 15 reps    Horizontal ABduction Weight (lbs) 2    External Rotation 20 reps     External Rotation Weight (lbs) 2    Diagonals --    Diagonals Limitations --    Other Supine Exercises flexion AROM x 15;       Shoulder Exercises: Prone   Other Prone Exercises Prone T, I  x 15;       Shoulder Exercises: Sidelying   External Rotation 15 reps;AROM    External Rotation Weight (lbs) 1      Shoulder Exercises: Standing   External Rotation 15 reps    Theraband Level (Shoulder External Rotation) Level 1 (Yellow)    Internal Rotation 15 reps    Theraband Level (Shoulder Internal Rotation) Level 2 (Red)    Row 20 reps    Theraband Level (Shoulder Row) Level 3 (Green)    Other Standing Exercises flexion  AROM x 10       Shoulder Exercises: Pulleys   Flexion 2 minutes      Shoulder Exercises: ROM/Strengthening   UBE (Upper Arm Bike) x 3 min L 1  Wall Wash --    Wall Pushups --      Manual Therapy   Manual Therapy Joint mobilization;Passive ROM;Soft tissue mobilization    Manual therapy comments skilled palpation and monitoring of soft tissue with dry needling     Soft tissue mobilization DTM and TPR to R posterior shoulder     Passive ROM --            Trigger Point Dry Needling - 02/08/20 0001    Consent Given? Yes    Education Handout Provided Previously provided    Muscles Treated Upper Quadrant Supraspinatus;Infraspinatus;Teres major;Teres minor    Supraspinatus Response Palpable increased muscle length    Infraspinatus Response Palpable increased muscle length    Teres major Response Palpable increased muscle length    Teres minor Response Palpable increased muscle length                  PT Short Term Goals - 01/09/20 1458      PT SHORT TERM GOAL #1   Title Pt to be independent wtih initial HEP    Time 2    Period Weeks    Status New    Target Date 01/23/20      PT SHORT TERM GOAL #2   Title Pt to demo improved flexion AROM by at least 20 deg    Time 3    Period Weeks    Status New    Target Date 01/30/20             PT  Long Term Goals - 01/09/20 1455      PT LONG TERM GOAL #1   Title Pt to be independent with final HEP    Time 6    Period Weeks    Status New    Target Date 02/20/20      PT LONG TERM GOAL #2   Title Pt to demo improved AROM of R shoulder to be WNL to improve ability for work duties and IADLs.    Time 6    Period Weeks    Status New    Target Date 02/20/20      PT LONG TERM GOAL #3   Title Pt to demo improved strength of R shoulder, to at least 4+/5 for all motions, to improve ability for reaching, lifting, carrying and IADLs.    Time 6    Period Weeks    Status New    Target Date 02/20/20      PT LONG TERM GOAL #4   Title Pt to report decreased pain in R shoulder, to 0-2/10 with activity    Time 6    Period Weeks    Status New    Target Date 02/20/20                 Plan - 02/08/20 1650    Clinical Impression Statement Pt continued to have weakness for ER and elevation. Soreness and trigger points in posterior shoulder addressed today with DN and DTM. Plan to progress strength as tolerated.    Examination-Activity Limitations Bathing;Locomotion Level;Reach Overhead;Sleep;Lift;Dressing    Examination-Participation Restrictions Meal Prep;Cleaning;Occupation;Community Activity;Driving;Shop;Laundry;Yard Work    Stability/Clinical Decision Making Stable/Uncomplicated    Rehab Potential Good    PT Frequency 2x / week    PT Duration 6 weeks    PT Treatment/Interventions ADLs/Self Care Home Management;Cryotherapy;Electrical Stimulation;DME Instruction;Ultrasound;Traction;Moist Heat;Iontophoresis 4mg /ml Dexamethasone;Functional mobility training;Therapeutic activities;Therapeutic exercise;Neuromuscular re-education;Manual techniques;Patient/family education;Passive range of motion;Dry needling;Splinting;Taping;Joint Manipulations;Spinal Manipulations;Vasopneumatic Device  Consulted and Agree with Plan of Care Patient           Patient will benefit from skilled  therapeutic intervention in order to improve the following deficits and impairments:  Decreased range of motion, Increased muscle spasms, Impaired UE functional use, Decreased activity tolerance, Pain, Impaired flexibility, Decreased strength, Decreased mobility, Decreased balance  Visit Diagnosis: Acute pain of right shoulder  Muscle weakness (generalized)     Problem List Patient Active Problem List   Diagnosis Date Noted   Cervical dysplasia 11/08/2016   Anxiety 09/01/2016   Complex regional pain syndrome 07/22/2012   Depression 10/01/2011   Nicotine dependence 07/02/2011   Foot pain, left 07/01/2011   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   HSV-2 (herpes simplex virus 2) infection     Sedalia Muta, PT, DPT 4:53 PM  02/08/20    [facility] [address] [place], [place], [address] Phone: [phone]   Fax:  [phone]  Name: COUMBA KELLISON MRN: [id number] Date of Birth: 1985/07/08

## 2020-02-10 ENCOUNTER — Encounter: Payer: 59 | Admitting: Physical Therapy

## 2020-02-16 ENCOUNTER — Ambulatory Visit: Payer: 59 | Admitting: Physical Therapy

## 2020-02-16 ENCOUNTER — Other Ambulatory Visit: Payer: Self-pay

## 2020-02-16 ENCOUNTER — Encounter: Payer: Self-pay | Admitting: Physical Therapy

## 2020-02-16 DIAGNOSIS — M6281 Muscle weakness (generalized): Secondary | ICD-10-CM

## 2020-02-16 DIAGNOSIS — M25511 Pain in right shoulder: Secondary | ICD-10-CM | POA: Diagnosis not present

## 2020-02-17 ENCOUNTER — Encounter: Payer: Self-pay | Admitting: Physical Therapy

## 2020-02-17 NOTE — Therapy (Signed)
[facility] [address] [place], [place], [address] Phone: [phone]   Fax:  [phone]  Physical Therapy Treatment/Re-Cert   Patient Details  Name: Katie Green MRN: [id number] Date of Birth: December 08, 1985 Referring Provider (PT): Lynne Leader   Encounter Date: 02/16/2020   PT End of Session - 02/16/20 1605    Visit Number 6    Number of Visits 20    Date for PT Re-Evaluation 03/29/20    Authorization Type UHC    PT Start Time 1600    PT Stop Time 1640    PT Time Calculation (min) 40 min    Activity Tolerance Patient tolerated treatment well    Behavior During Therapy [facility] for tasks assessed/performed           Past Medical History:  Diagnosis Date  . Anemia   . Anxiety   . Arthritis   . Cervical dysplasia    LEEP for high grade 03/2010 margins clear, lgsil pap/C&B 09/2010, ASCUS neg HR HPV pap 04/2011  . Complex regional pain syndrome    Left foot  . Depression   . HSV-2 (herpes simplex virus 2) infection   . Kidney stones   . Nephrolithiasis 07/29/2016   Confirmed multiple stones via CT scan    Past Surgical History:  Procedure Laterality Date  . CERVICAL BIOPSY  W/ LOOP ELECTRODE EXCISION  03/2010   high grade with free margins  . GUM SURGERY      There were no vitals filed for this visit.   Subjective Assessment - 02/16/20 1604    Subjective Pt had fall onto R shoulder over the weekend. Shoulder has been sore    Currently in Pain? Yes    Pain Score 4     Pain Location Shoulder    Pain Orientation Right    Pain Descriptors / Indicators Aching    Pain Type Acute pain    Pain Onset More than a month ago    Pain Frequency Intermittent              [facility] PT Assessment - 02/17/20 0001      AROM   Right Shoulder Flexion 160 Degrees    Right Shoulder ABduction 135 Degrees    Right Shoulder Internal Rotation --   wnl   Right Shoulder External Rotation --   wnl     Strength   Right Shoulder  Flexion 3+/5    Right Shoulder ABduction 3/5    Right Shoulder Internal Rotation 4/5    Right Shoulder External Rotation 4-/5                         [facility] Adult PT Treatment/Exercise - 02/16/20 1605      Exercises   Exercises Shoulder      Shoulder Exercises: Supine   Protraction 20 reps    Protraction Weight (lbs) 4    Horizontal ABduction --    Horizontal ABduction Weight (lbs) --    External Rotation 20 reps    External Rotation Weight (lbs) 2    Other Supine Exercises --      Shoulder Exercises: Prone   Other Prone Exercises Prone T, I  x 15;  Y x 10      Shoulder Exercises: Sidelying   External Rotation --    External Rotation Weight (lbs) --    Other Sidelying Exercises horiz abd x 15 AROM      Shoulder Exercises:  Standing   External Rotation 15 reps    Theraband Level (Shoulder External Rotation) Level 1 (Yellow)    Internal Rotation 15 reps    Theraband Level (Shoulder Internal Rotation) Level 2 (Red)    Row 20 reps    Theraband Level (Shoulder Row) Level 3 (Green)    Other Standing Exercises flexion , 1 lb  AROM x 10       Shoulder Exercises: Pulleys   Flexion 2 minutes      Shoulder Exercises: ROM/Strengthening   UBE (Upper Arm Bike) x 4 min L 1     Rhythmic Stabilization, Supine ER YTB quick x 25;       Shoulder Exercises: Body Blade   Flexion 45 seconds    ABduction 45 seconds    External Rotation 45 seconds      Manual Therapy   Manual Therapy Joint mobilization;Passive ROM;Soft tissue mobilization    Manual therapy comments --    Soft tissue mobilization --                  PT Education - 02/17/20 0918    Education Details HEP reviewed    Person(s) Educated Patient    Methods Explanation;Demonstration;Verbal cues    Comprehension Verbalized understanding;Returned demonstration;Verbal cues required;Need further instruction            PT Short Term Goals - 02/17/20 0920      PT SHORT TERM GOAL #1   Title Pt to be  independent wtih initial HEP    Time 2    Period Weeks    Status Achieved    Target Date 01/23/20      PT SHORT TERM GOAL #2   Title Pt to demo improved flexion AROM by at least 20 deg    Time 3    Period Weeks    Status Achieved    Target Date 01/30/20             PT Long Term Goals - 02/17/20 0920      PT LONG TERM GOAL #1   Title Pt to be independent with final HEP    Time 6    Period Weeks    Status Partially Met    Target Date 03/29/20      PT LONG TERM GOAL #2   Title Pt to demo improved AROM of R shoulder to be WNL to improve ability for work duties and IADLs.    Time 6    Period Weeks    Status Partially Met    Target Date 03/29/20      PT LONG TERM GOAL #3   Title Pt to demo improved strength of R shoulder, to at least 4+/5 for all motions, to improve ability for reaching, lifting, carrying and IADLs.    Time 6    Period Weeks    Status On-going    Target Date 03/29/20      PT LONG TERM GOAL #4   Title Pt to report decreased pain in R shoulder, to 0-2/10 with activity    Time 6    Period Weeks    Status Partially Met    Target Date 03/29/20                 Plan - 02/17/20 6237    Clinical Impression Statement Pt has been seen for 6 visits. She has much improved AROM and much improved pain overall. She continues to be limited by weakness. Fatigues very easily with  repeated elevation, with shoulder hiking present. Pt has noted weakness for ER as wel las  scapular dyskinesia with elevation. Sthe has difficulty with clicking and catching of posterior shoulder with deceleration. Pt will benefit from continued strengthening and stabilization and to meet LTGS.    Examination-Activity Limitations Bathing;Locomotion Level;Reach Overhead;Sleep;Lift;Dressing    Examination-Participation Restrictions Meal Prep;Cleaning;Occupation;Community Activity;Driving;Shop;Laundry;Yard Work    Stability/Clinical Decision Making Stable/Uncomplicated    Rehab Potential  Good    PT Frequency 2x / week    PT Duration 6 weeks    PT Treatment/Interventions ADLs/Self Care Home Management;Cryotherapy;Electrical Stimulation;DME Instruction;Ultrasound;Traction;Moist Heat;Iontophoresis 57m/ml Dexamethasone;Functional mobility training;Therapeutic activities;Therapeutic exercise;Neuromuscular re-education;Manual techniques;Patient/family education;Passive range of motion;Dry needling;Splinting;Taping;Joint Manipulations;Spinal Manipulations;Vasopneumatic Device    Consulted and Agree with Plan of Care Patient           Patient will benefit from skilled therapeutic intervention in order to improve the following deficits and impairments:  Decreased range of motion, Increased muscle spasms, Impaired UE functional use, Decreased activity tolerance, Pain, Impaired flexibility, Decreased strength, Decreased mobility, Decreased balance  Visit Diagnosis: Acute pain of right shoulder  Muscle weakness (generalized)     Problem List Patient Active Problem List   Diagnosis Date Noted  . Cervical dysplasia 11/08/2016  . Anxiety 09/01/2016  . Complex regional pain syndrome 07/22/2012  . Depression 10/01/2011  . Nicotine dependence 07/02/2011  . Foot pain, left 07/01/2011  . 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 HSV-2 (herpes simplex virus 2) infection     LLyndee Hensen PT, DPT 9:31 AM  02/17/20    [facility] [address]Phone: [phone]  Fax:  [phone] Name: Katie FAVORMRN: [id number] of Birth: [birth year]

## 2020-02-19 ENCOUNTER — Other Ambulatory Visit: Payer: Self-pay | Admitting: Family Medicine

## 2020-02-19 DIAGNOSIS — M7501 Adhesive capsulitis of right shoulder: Secondary | ICD-10-CM

## 2020-02-20 MED ORDER — TRAMADOL HCL 50 MG PO TABS: 50.0000 mg | ORAL_TABLET | Freq: Three times a day (TID) | ORAL | 0 refills | Status: DC | PRN

## 2020-02-22 ENCOUNTER — Encounter: Payer: 59 | Admitting: Physical Therapy

## 2020-02-26 ENCOUNTER — Encounter: Payer: Self-pay | Admitting: Obstetrics and Gynecology

## 2020-02-27 ENCOUNTER — Other Ambulatory Visit: Payer: Self-pay

## 2020-02-27 ENCOUNTER — Ambulatory Visit: Payer: 59 | Admitting: Physical Therapy

## 2020-02-27 ENCOUNTER — Encounter: Payer: Self-pay | Admitting: Physical Therapy

## 2020-02-27 DIAGNOSIS — M6281 Muscle weakness (generalized): Secondary | ICD-10-CM | POA: Diagnosis not present

## 2020-02-27 DIAGNOSIS — M25511 Pain in right shoulder: Secondary | ICD-10-CM

## 2020-02-27 MED ORDER — VALACYCLOVIR HCL 500 MG PO TABS: 500.0000 mg | ORAL_TABLET | Freq: Two times a day (BID) | ORAL | 3 refills | Status: DC

## 2020-02-29 ENCOUNTER — Encounter: Payer: Self-pay | Admitting: Physical Therapy

## 2020-02-29 NOTE — Therapy (Signed)
[facility] [address] [place], [place], [address] Phone: [phone]   Fax:  [phone]  Physical Therapy Treatment  Patient Details  Name: Katie Green MRN: [id number] Date of Birth: 08/13/1985 Referring Provider (PT): Lynne Leader   Encounter Date: 02/27/2020   PT End of Session - 02/29/20 1041    Visit Number 7    Number of Visits 20    Date for PT Re-Evaluation 03/29/20    Authorization Type UHC    PT Start Time 1603    PT Stop Time 1645    PT Time Calculation (min) 42 min    Activity Tolerance Patient tolerated treatment well    Behavior During Therapy [facility] for tasks assessed/performed           Past Medical History:  Diagnosis Date   Anemia    Anxiety    Arthritis    Cervical dysplasia    LEEP for high grade 03/2010 margins clear, lgsil pap/C&B 09/2010, ASCUS neg HR HPV pap 04/2011   Complex regional pain syndrome    Left foot   Depression    HSV-2 (herpes simplex virus 2) infection    Kidney stones    Nephrolithiasis 07/29/2016   Confirmed multiple stones via CT scan    Past Surgical History:  Procedure Laterality Date   CERVICAL BIOPSY  W/ LOOP ELECTRODE EXCISION  03/2010   high grade with free margins   GUM SURGERY      There were no vitals filed for this visit.   Subjective Assessment - 02/29/20 1041    Subjective Pt states less pain, but continued weakness in arm.    Currently in Pain? Yes    Pain Score 2     Pain Location Shoulder    Pain Orientation Right    Pain Descriptors / Indicators Aching    Pain Type Acute pain    Pain Onset More than a month ago    Pain Frequency Intermittent                             [facility] Adult PT Treatment/Exercise - 02/29/20 0001      Exercises   Exercises Shoulder      Shoulder Exercises: Supine   Protraction 20 reps    Protraction Weight (lbs) 4    External Rotation 20 reps    External Rotation Weight (lbs) 2     Other Supine Exercises ER, rhythmic stabs YTB/  2 x 20;       Shoulder Exercises: Seated   Other Seated Exercises Seated Lat presses x 15;       Shoulder Exercises: Prone   Other Prone Exercises Prone T, I  x 15;        Shoulder Exercises: Sidelying   Other Sidelying Exercises horiz abd x 15 AROM      Shoulder Exercises: Standing   External Rotation 15 reps    Theraband Level (Shoulder External Rotation) Level 2 (Red)    Internal Rotation 15 reps    Theraband Level (Shoulder Internal Rotation) Level 2 (Red)    Row 20 reps    Theraband Level (Shoulder Row) Level 3 (Green)    Other Standing Exercises flexion , 1 lb  AROM x 10 : 2lb- unable    Other Standing Exercises military press 2 lb x 5;       Shoulder Exercises: Pulleys   Flexion 2 minutes  Shoulder Exercises: ROM/Strengthening   UBE (Upper Arm Bike) x 4 min L 1       Manual Therapy   Manual Therapy Joint mobilization;Passive ROM;Soft tissue mobilization                    PT Short Term Goals - 02/17/20 0920      PT SHORT TERM GOAL #1   Title Pt to be independent wtih initial HEP    Time 2    Period Weeks    Status Achieved    Target Date 01/23/20      PT SHORT TERM GOAL #2   Title Pt to demo improved flexion AROM by at least 20 deg    Time 3    Period Weeks    Status Achieved    Target Date 01/30/20             PT Long Term Goals - 02/17/20 0920      PT LONG TERM GOAL #1   Title Pt to be independent with final HEP    Time 6    Period Weeks    Status Partially Met    Target Date 03/29/20      PT LONG TERM GOAL #2   Title Pt to demo improved AROM of R shoulder to be WNL to improve ability for work duties and IADLs.    Time 6    Period Weeks    Status Partially Met    Target Date 03/29/20      PT LONG TERM GOAL #3   Title Pt to demo improved strength of R shoulder, to at least 4+/5 for all motions, to improve ability for reaching, lifting, carrying and IADLs.    Time 6    Period  Weeks    Status On-going    Target Date 03/29/20      PT LONG TERM GOAL #4   Title Pt to report decreased pain in R shoulder, to 0-2/10 with activity    Time 6    Period Weeks    Status Partially Met    Target Date 03/29/20                 Plan - 02/29/20 1042    Clinical Impression Statement Pt with less pain from previous session/ after fall. She is improving with ability for AROM, but fatigues very easily with repeated motions for elevation. Signfiicant difference between trying to lift 1 lb vs 2 lb for elevation (unable ). Pt to benefti from continued care with focus on strengthening.    Examination-Activity Limitations Bathing;Locomotion Level;Reach Overhead;Sleep;Lift;Dressing    Examination-Participation Restrictions Meal Prep;Cleaning;Occupation;Community Activity;Driving;Shop;Laundry;Yard Work    Stability/Clinical Decision Making Stable/Uncomplicated    Rehab Potential Good    PT Frequency 2x / week    PT Duration 6 weeks    PT Treatment/Interventions ADLs/Self Care Home Management;Cryotherapy;Electrical Stimulation;DME Instruction;Ultrasound;Traction;Moist Heat;Iontophoresis 10m/ml Dexamethasone;Functional mobility training;Therapeutic activities;Therapeutic exercise;Neuromuscular re-education;Manual techniques;Patient/family education;Passive range of motion;Dry needling;Splinting;Taping;Joint Manipulations;Spinal Manipulations;Vasopneumatic Device    Consulted and Agree with Plan of Care Patient           Patient will benefit from skilled therapeutic intervention in order to improve the following deficits and impairments:  Decreased range of motion, Increased muscle spasms, Impaired UE functional use, Decreased activity tolerance, Pain, Impaired flexibility, Decreased strength, Decreased mobility, Decreased balance  Visit Diagnosis: Acute pain of right shoulder  Muscle weakness (generalized)     Problem List Patient Active Problem List   Diagnosis Date  Noted   Cervical dysplasia 11/08/2016   Anxiety 09/01/2016   Complex regional pain syndrome 07/22/2012   Depression 10/01/2011   Nicotine dependence 07/02/2011   Foot pain, left 07/01/2011   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   HSV-2 (herpes simplex virus 2) infection     Lyndee Hensen, PT, DPT 10:46 AM  02/29/20    [facility] [place], [place], [address] Phone: [phone]   Fax:  [phone]  Name: SANAH KRASKA MRN: [id number] Date of Birth: Sep 06, 1985

## 2020-03-05 ENCOUNTER — Ambulatory Visit: Payer: 59 | Admitting: Physical Therapy

## 2020-03-05 ENCOUNTER — Other Ambulatory Visit: Payer: Self-pay

## 2020-03-05 DIAGNOSIS — M6281 Muscle weakness (generalized): Secondary | ICD-10-CM | POA: Diagnosis not present

## 2020-03-05 DIAGNOSIS — M25511 Pain in right shoulder: Secondary | ICD-10-CM | POA: Diagnosis not present

## 2020-03-08 ENCOUNTER — Other Ambulatory Visit: Payer: Self-pay | Admitting: Family Medicine

## 2020-03-08 NOTE — Telephone Encounter (Signed)
Please advise 

## 2020-03-12 ENCOUNTER — Encounter: Payer: Self-pay | Admitting: Physical Therapy

## 2020-03-12 ENCOUNTER — Encounter: Payer: 59 | Admitting: Physical Therapy

## 2020-03-12 NOTE — Therapy (Addendum)
[facility] [address] [place], [place], [address] Phone: [phone]   Fax:  [phone]  Physical Therapy Treatment  Patient Details  Name: Katie Green MRN: [id number] Date of Birth: [birth year] Referring Provider (PT): Lynne Leader   Encounter Date: 03/05/2020   PT End of Session - 03/12/20 1221    Visit Number 8    Number of Visits 20    Date for PT Re-Evaluation 03/29/20    Authorization Type UHC    PT Start Time 1603    PT Stop Time 1645    PT Time Calculation (min) 42 min    Activity Tolerance Patient tolerated treatment well    Behavior During Therapy [facility] for tasks assessed/performed           Past Medical History:  Diagnosis Date  . Anemia   . Anxiety   . Arthritis   . Cervical dysplasia    LEEP for high grade 03/2010 margins clear, lgsil pap/C&B 09/2010, ASCUS neg HR HPV pap 04/2011  . Complex regional pain syndrome    Left foot  . Depression   . HSV-2 (herpes simplex virus 2) infection   . Kidney stones   . Nephrolithiasis 07/29/2016   Confirmed multiple stones via CT scan    Past Surgical History:  Procedure Laterality Date  . CERVICAL BIOPSY  W/ LOOP ELECTRODE EXCISION  03/2010   high grade with free margins  . GUM SURGERY      There were no vitals filed for this visit.   Subjective Assessment - 03/12/20 1220    Subjective Pt states less pain, but continued weakness in arm.    Currently in Pain? Yes    Pain Score 2     Pain Location Shoulder    Pain Orientation Right    Pain Descriptors / Indicators Aching    Pain Type Acute pain    Pain Onset More than a month ago    Pain Frequency Intermittent                             [facility] Adult PT Treatment/Exercise - 03/12/20 0001      Exercises   Exercises Shoulder      Shoulder Exercises: Supine   Protraction 20 reps    Protraction Weight (lbs) 4    Flexion 15 reps;Right    Shoulder Flexion Weight (lbs) 2    Flexion  Limitations 1/2 reclined position    Other Supine Exercises ER, rhythmic stabs YTB/  2 x 20;       Shoulder Exercises: Seated   Other Seated Exercises Seated Lat presses x 15;       Shoulder Exercises: Prone   Other Prone Exercises Prone T, I  x 15;        Shoulder Exercises: Sidelying   Other Sidelying Exercises horiz abd x 15 AROM      Shoulder Exercises: Standing   External Rotation 15 reps    Theraband Level (Shoulder External Rotation) Level 2 (Red)    Internal Rotation 15 reps    Theraband Level (Shoulder Internal Rotation) Level 3 (Green)    Row 20 reps    Theraband Level (Shoulder Row) Level 3 (Green)    Row Limitations Low Row GTB x 20;     Other Standing Exercises flexion , 1 lb  AROM x 10 : 2lb- unable    Other Standing Exercises military press 2 lb x 5;  Wall push ups x 20;       Shoulder Exercises: ROM/Strengthening   UBE (Upper Arm Bike) x 4 min L 1       Shoulder Exercises: Body Blade   Flexion 30 seconds    ABduction 30 seconds    External Rotation 30 seconds      Manual Therapy   Manual Therapy Joint mobilization;Passive ROM;Soft tissue mobilization                    PT Short Term Goals - 02/17/20 0920      PT SHORT TERM GOAL #1   Title Pt to be independent wtih initial HEP    Time 2    Period Weeks    Status Achieved    Target Date 01/23/20      PT SHORT TERM GOAL #2   Title Pt to demo improved flexion AROM by at least 20 deg    Time 3    Period Weeks    Status Achieved    Target Date 01/30/20             PT Long Term Goals - 02/17/20 0920      PT LONG TERM GOAL #1   Title Pt to be independent with final HEP    Time 6    Period Weeks    Status Partially Met    Target Date 03/29/20      PT LONG TERM GOAL #2   Title Pt to demo improved AROM of R shoulder to be WNL to improve ability for work duties and IADLs.    Time 6    Period Weeks    Status Partially Met    Target Date 03/29/20      PT LONG TERM GOAL #3   Title  Pt to demo improved strength of R shoulder, to at least 4+/5 for all motions, to improve ability for reaching, lifting, carrying and IADLs.    Time 6    Period Weeks    Status On-going    Target Date 03/29/20      PT LONG TERM GOAL #4   Title Pt to report decreased pain in R shoulder, to 0-2/10 with activity    Time 6    Period Weeks    Status Partially Met    Target Date 03/29/20                 Plan - 03/12/20 1221    Clinical Impression Statement Pt has been able to progress strength for IR/ER, and is going well with active elevation/ AROM, however still has much weakness with attempts for repeated motions, or lifting light weight , 2 lb. Strengthening has been main limiting factor, and has been continued focus for treatment.    Examination-Activity Limitations Bathing;Locomotion Level;Reach Overhead;Sleep;Lift;Dressing    Examination-Participation Restrictions Meal Prep;Cleaning;Occupation;Community Activity;Driving;Shop;Laundry;Yard Work    Stability/Clinical Decision Making Stable/Uncomplicated    Rehab Potential Good    PT Frequency 2x / week    PT Duration 6 weeks    PT Treatment/Interventions ADLs/Self Care Home Management;Cryotherapy;Electrical Stimulation;DME Instruction;Ultrasound;Traction;Moist Heat;Iontophoresis 4m/ml Dexamethasone;Functional mobility training;Therapeutic activities;Therapeutic exercise;Neuromuscular re-education;Manual techniques;Patient/family education;Passive range of motion;Dry needling;Splinting;Taping;Joint Manipulations;Spinal Manipulations;Vasopneumatic Device    Consulted and Agree with Plan of Care Patient           Patient will benefit from skilled therapeutic intervention in order to improve the following deficits and impairments:  Decreased range of motion,Increased muscle spasms,Impaired UE functional use,Decreased activity tolerance,Pain,Impaired flexibility,Decreased strength,Decreased mobility,Decreased  balance  Visit  Diagnosis: Acute pain of right shoulder  Muscle weakness (generalized)     Problem List Patient Active Problem List   Diagnosis Date Noted  . Cervical dysplasia 11/08/2016  . Anxiety 09/01/2016  . Complex regional pain syndrome 07/22/2012  . Depression 10/01/2011  . Nicotine dependence 07/02/2011  . Foot pain, left 07/01/2011  . 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 HSV-2 (herpes simplex virus 2) infection     Lyndee Hensen, PT, DPT 12:24 PM  03/12/20    [facility] [place], [place], [address] Phone: [phone]   Fax:  [phone]  Name: TASHEKA HOUSEMAN MRN: [id number] Date of Birth: [birth year]   PHYSICAL THERAPY DISCHARGE SUMMARY  Visits from Start of Care: 8 Plan: Patient agrees to discharge.  Patient goals were partially met. Patient is being discharged due to not returning since the last visit.  ?????     Lyndee Hensen, PT, DPT 11:27 AM  07/25/20

## 2020-03-13 ENCOUNTER — Encounter: Payer: 59 | Admitting: Physical Therapy

## 2020-03-15 ENCOUNTER — Encounter: Payer: 59 | Admitting: Physical Therapy

## 2020-03-19 ENCOUNTER — Other Ambulatory Visit: Payer: Self-pay | Admitting: Family Medicine

## 2020-03-19 DIAGNOSIS — M7501 Adhesive capsulitis of right shoulder: Secondary | ICD-10-CM

## 2020-03-19 NOTE — Telephone Encounter (Signed)
Please advise 

## 2020-03-20 ENCOUNTER — Ambulatory Visit: Payer: 59 | Admitting: Family Medicine

## 2020-04-11 ENCOUNTER — Encounter: Payer: Self-pay | Admitting: Obstetrics and Gynecology

## 2020-04-12 ENCOUNTER — Encounter: Payer: 59 | Admitting: Physical Therapy

## 2020-04-16 ENCOUNTER — Encounter: Payer: 59 | Admitting: Physical Therapy

## 2020-06-18 ENCOUNTER — Other Ambulatory Visit: Payer: Self-pay | Admitting: *Deleted

## 2020-06-18 MED ORDER — NORETHINDRONE ACET-ETHINYL EST 1-20 MG-MCG PO TABS: 1.0000 | ORAL_TABLET | Freq: Every day | ORAL | 0 refills | Status: DC

## 2020-06-18 NOTE — Telephone Encounter (Signed)
Medication refill request: Loestrin  Last AEX:  06-30-19 JK Next AEX: 07-02-20 TW Last MMG (if hormonal medication request): n/a Refill authorized: Today, please advise.   Medication pended for #28, 0RF. Please refill if appropriate.

## 2020-07-02 ENCOUNTER — Ambulatory Visit (INDEPENDENT_AMBULATORY_CARE_PROVIDER_SITE_OTHER): Payer: 59 | Admitting: Nurse Practitioner

## 2020-07-02 ENCOUNTER — Other Ambulatory Visit: Payer: Self-pay

## 2020-07-02 ENCOUNTER — Encounter: Payer: Self-pay | Admitting: Nurse Practitioner

## 2020-07-02 VITALS — BP 118/78 | Ht 63.0 in | Wt 94.0 lb

## 2020-07-02 DIAGNOSIS — Z01419 Encounter for gynecological examination (general) (routine) without abnormal findings: Secondary | ICD-10-CM | POA: Diagnosis not present

## 2020-07-02 DIAGNOSIS — Z3041 Encounter for surveillance of contraceptive pills: Secondary | ICD-10-CM | POA: Diagnosis not present

## 2020-07-02 MED ORDER — NORETHINDRONE ACET-ETHINYL EST 1-20 MG-MCG PO TABS: 1.0000 | ORAL_TABLET | Freq: Every day | ORAL | 4 refills | Status: DC

## 2020-07-02 NOTE — Patient Instructions (Signed)
Health Maintenance, Female Adopting a healthy lifestyle and getting preventive care are important in promoting health and wellness. Ask your health care provider about:  The right schedule for you to have regular tests and exams.  Things you can do on your own to prevent diseases and keep yourself healthy. What should I know about diet, weight, and exercise? Eat a healthy diet  Eat a diet that includes plenty of vegetables, fruits, low-fat dairy products, and lean protein.  Do not eat a lot of foods that are high in solid fats, added sugars, or sodium.   Maintain a healthy weight Body mass index (BMI) is used to identify weight problems. It estimates body fat based on height and weight. Your health care provider can help determine your BMI and help you achieve or maintain a healthy weight. Get regular exercise Get regular exercise. This is one of the most important things you can do for your health. Most adults should:  Exercise for at least 150 minutes each week. The exercise should increase your heart rate and make you sweat (moderate-intensity exercise).  Do strengthening exercises at least twice a week. This is in addition to the moderate-intensity exercise.  Spend less time sitting. Even light physical activity can be beneficial. Watch cholesterol and blood lipids Have your blood tested for lipids and cholesterol at 37 years of age, then have this test every 5 years. Have your cholesterol levels checked more often if:  Your lipid or cholesterol levels are high.  You are older than 37 years of age.  You are at high risk for heart disease. What should I know about cancer screening? Depending on your health history and family history, you may need to have cancer screening at various ages. This may include screening for:  Breast cancer.  Cervical cancer.  Colorectal cancer.  Skin cancer.  Lung cancer. What should I know about heart disease, diabetes, and high blood  pressure? Blood pressure and heart disease  High blood pressure causes heart disease and increases the risk of stroke. This is more likely to develop in people who have high blood pressure readings, are of African descent, or are overweight.  Have your blood pressure checked: ? Every 3-5 years if you are 18-39 years of age. ? Every year if you are 40 years old or older. Diabetes Have regular diabetes screenings. This checks your fasting blood sugar level. Have the screening done:  Once every three years after age 40 if you are at a normal weight and have a low risk for diabetes.  More often and at a younger age if you are overweight or have a high risk for diabetes. What should I know about preventing infection? Hepatitis B If you have a higher risk for hepatitis B, you should be screened for this virus. Talk with your health care provider to find out if you are at risk for hepatitis B infection. Hepatitis C Testing is recommended for:  Everyone born from 1945 through 1965.  Anyone with known risk factors for hepatitis C. Sexually transmitted infections (STIs)  Get screened for STIs, including gonorrhea and chlamydia, if: ? You are sexually active and are younger than 37 years of age. ? You are older than 37 years of age and your health care provider tells you that you are at risk for this type of infection. ? Your sexual activity has changed since you were last screened, and you are at increased risk for chlamydia or gonorrhea. Ask your health care provider   if you are at risk.  Ask your health care provider about whether you are at high risk for HIV. Your health care provider may recommend a prescription medicine to help prevent HIV infection. If you choose to take medicine to prevent HIV, you should first get tested for HIV. You should then be tested every 3 months for as long as you are taking the medicine. Pregnancy  If you are about to stop having your period (premenopausal) and  you may become pregnant, seek counseling before you get pregnant.  Take 400 to 800 micrograms (mcg) of folic acid every day if you become pregnant.  Ask for birth control (contraception) if you want to prevent pregnancy. Osteoporosis and menopause Osteoporosis is a disease in which the bones lose minerals and strength with aging. This can result in bone fractures. If you are 65 years old or older, or if you are at risk for osteoporosis and fractures, ask your health care provider if you should:  Be screened for bone loss.  Take a calcium or vitamin D supplement to lower your risk of fractures.  Be given hormone replacement therapy (HRT) to treat symptoms of menopause. Follow these instructions at home: Lifestyle  Do not use any products that contain nicotine or tobacco, such as cigarettes, e-cigarettes, and chewing tobacco. If you need help quitting, ask your health care provider.  Do not use street drugs.  Do not share needles.  Ask your health care provider for help if you need support or information about quitting drugs. Alcohol use  Do not drink alcohol if: ? Your health care provider tells you not to drink. ? You are pregnant, may be pregnant, or are planning to become pregnant.  If you drink alcohol: ? Limit how much you use to 0-1 drink a day. ? Limit intake if you are breastfeeding.  Be aware of how much alcohol is in your drink. In the [place] one drink equals one 12 oz bottle of beer (355 mL), one 5 oz glass of wine (148 mL), or one 1 oz glass of hard liquor (44 mL). General instructions  Schedule regular health, dental, and eye exams.  Stay current with your vaccines.  Tell your health care provider if: ? You often feel depressed. ? You have ever been abused or do not feel safe at home. Summary  Adopting a healthy lifestyle and getting preventive care are important in promoting health and wellness.  Follow your health care provider's instructions about healthy  diet, exercising, and getting tested or screened for diseases.  Follow your health care provider's instructions on monitoring your cholesterol and blood pressure. This information is not intended to replace advice given to you by your health care provider. Make sure you discuss any questions you have with your health care provider. Document Revised: 03/10/2018 Document Reviewed: 03/10/2018 Elsevier Patient Education  2021 [employer] Inc.  

## 2020-07-02 NOTE — Progress Notes (Signed)
   Katie Green Dec 24, 1985 [id number]   History:  35 y.o. G0 presents for annual exam. Light cycle every few months on OCPs. 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 History of HSV. Uses Valtrex as needed. Not currently sexually active.   Gynecologic History No LMP recorded. (Menstrual status: Oral contraceptives).   Contraception/Family planning: OCP (estrogen/progesterone)  Health Maintenance Last Pap: 06/25/2018. Results were: normal Last mammogram: N/A Last colonoscopy:N/A Last Dexa: N/A  Past medical history, past surgical history, family history and social history were all reviewed and documented in the EPIC chart.  ROS:  A ROS was performed and pertinent positives and negatives are included.  Exam:  Vitals:   07/02/20 1422  BP: 118/78  Weight: 94 lb (42.6 kg)  Height: 5\' 3"  (1.6 m)   Body mass index is 16.65 kg/m.  General appearance:  Normal Thyroid:  Symmetrical, normal in size, without palpable masses or nodularity. Respiratory  Auscultation:  Clear without wheezing or rhonchi Cardiovascular  Auscultation:  Regular rate, without rubs, murmurs or gallops  Edema/varicosities:  Not grossly evident Abdominal  Soft,nontender, without masses, guarding or rebound.  Liver/spleen:  No organomegaly noted  Hernia:  None appreciated  Skin  Inspection:  Grossly normal   Breasts: Examined lying and sitting.   Right: Without masses, retractions, discharge or axillary adenopathy.   Left: Without masses, retractions, discharge or axillary adenopathy. Gentitourinary   Inguinal/mons:  Normal without inguinal adenopathy  External genitalia:  Normal  BUS/Urethra/Skene's glands:  Normal  Vagina:  Normal  Cervix:  Normal  Uterus:  Normal in size, shape and contour.  Midline and mobile  Adnexa/parametria:     Rt: Without masses or tenderness.   Lt: Without masses or tenderness.  Anus and  perineum: Normal  Assessment/Plan:  35 y.o. G0 for annual exam.   Well female exam with routine gynecological exam - Plan: CBC with Differential/Platelet, Comprehensive metabolic panel. Education provided on SBEs, importance of preventative screenings, current guidelines, high calcium diet, regular exercise, and multivitamin daily. BMI 16 - Has always been very thin, reports eating high calorie diet.   Encounter for surveillance of contraceptive pills - Plan: norethindrone-ethinyl estradiol (JUNEL 1/20) 1-20 MG-MCG tablet daily. Taking as prescribed. Refill x 1 year provided.   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 Will repeat next year at 3-year interval per guidelines.  Return in 1 year for annual.   10/2011 DNP, 2:42 PM 07/02/2020

## 2020-07-03 ENCOUNTER — Telehealth: Payer: Self-pay

## 2020-07-03 ENCOUNTER — Other Ambulatory Visit: Payer: Self-pay

## 2020-07-03 DIAGNOSIS — Z3041 Encounter for surveillance of contraceptive pills: Secondary | ICD-10-CM

## 2020-07-03 LAB — COMPREHENSIVE METABOLIC PANEL
AG Ratio: 2.1 (calc) (ref 1.0–2.5)
ALT: 12 U/L (ref 6–29)
AST: 16 U/L (ref 10–30)
Albumin: 4.7 g/dL (ref 3.6–5.1)
Alkaline phosphatase (APISO): 32 U/L (ref 31–125)
BUN: 15 mg/dL (ref 7–25)
CO2: 21 mmol/L (ref 20–32)
Calcium: 9.5 mg/dL (ref 8.6–10.2)
Chloride: 104 mmol/L (ref 98–110)
Creat: 1.06 mg/dL (ref 0.50–1.10)
Globulin: 2.2 g/dL (calc) (ref 1.9–3.7)
Glucose, Bld: 74 mg/dL (ref 65–99)
Potassium: 3.8 mmol/L (ref 3.5–5.3)
Sodium: 138 mmol/L (ref 135–146)
Total Bilirubin: 0.2 mg/dL (ref 0.2–1.2)
Total Protein: 6.9 g/dL (ref 6.1–8.1)

## 2020-07-03 LAB — CBC WITH DIFFERENTIAL/PLATELET
Absolute Monocytes: 354 cells/uL (ref 200–950)
Basophils Absolute: 41 cells/uL (ref 0–200)
Basophils Relative: 0.6 %
Eosinophils Absolute: 27 cells/uL (ref 15–500)
Eosinophils Relative: 0.4 %
HCT: 36.6 % (ref 35.0–45.0)
Hemoglobin: 11.9 g/dL (ref 11.7–15.5)
Lymphs Abs: 2475 cells/uL (ref 850–3900)
MCH: 31.4 pg (ref 27.0–33.0)
MCHC: 32.5 g/dL (ref 32.0–36.0)
MCV: 96.6 fL (ref 80.0–100.0)
MPV: 9.4 fL (ref 7.5–12.5)
Monocytes Relative: 5.2 %
Neutro Abs: 3903 cells/uL (ref 1500–7800)
Neutrophils Relative %: 57.4 %
Platelets: 484 10*3/uL — ABNORMAL HIGH (ref 140–400)
RBC: 3.79 10*6/uL — ABNORMAL LOW (ref 3.80–5.10)
RDW: 12.2 % (ref 11.0–15.0)
Total Lymphocyte: 36.4 %
WBC: 6.8 10*3/uL (ref 3.8–10.8)

## 2020-07-03 MED ORDER — NORETHIN ACE-ETH ESTRAD-FE 1-20 MG-MCG PO TABS
1.0000 | ORAL_TABLET | Freq: Every day | ORAL | 4 refills | Status: DC
Start: 2020-07-03 — End: 2021-07-01

## 2020-07-03 NOTE — Telephone Encounter (Signed)
I am going to put the paper that they sent on your desk for your signature so I can just fax it back to them. :)

## 2020-07-03 NOTE — Telephone Encounter (Signed)
We can continue with her previous dosage form of Junel. Not sure what I did differently since I just requested a refill.

## 2020-07-03 NOTE — Telephone Encounter (Signed)
Re: new birth control pill Rx.  [facility] wrote " The patient has a history of JUNEL FE TAB 1/20. Your new prescription indicates a change to JUNEL FE TAB 1/20-21. Please verify if DOSAGE FORM is intended.  Yes, fill as prescribed. No, continue per history with JUNEL FE TAB 1/20."

## 2020-11-05 ENCOUNTER — Other Ambulatory Visit: Payer: Self-pay | Admitting: *Deleted

## 2020-11-05 MED ORDER — VALACYCLOVIR HCL 500 MG PO TABS: 500.0000 mg | ORAL_TABLET | Freq: Two times a day (BID) | ORAL | 3 refills | Status: AC

## 2020-11-05 NOTE — Telephone Encounter (Signed)
Medication refill request: Valtrex  Last AEX:  07-02-20 TW Next AEX: not currently scheduled  Last MMG (if hormonal medication request): n/a Refill authorized: Today, please advise.   Medication pended for #30, 3RF. Please refill if appropriate.

## 2020-11-16 ENCOUNTER — Encounter: Payer: Self-pay | Admitting: Nurse Practitioner

## 2021-06-30 ENCOUNTER — Other Ambulatory Visit: Payer: Self-pay | Admitting: Nurse Practitioner

## 2021-06-30 DIAGNOSIS — Z3041 Encounter for surveillance of contraceptive pills: Secondary | ICD-10-CM

## 2021-07-01 NOTE — Telephone Encounter (Signed)
AEX 07/02/20. ?Scheduled 07/15/21. ?

## 2021-07-15 ENCOUNTER — Ambulatory Visit: Payer: 59 | Admitting: Nurse Practitioner

## 2021-07-24 ENCOUNTER — Ambulatory Visit: Payer: 59 | Admitting: Nurse Practitioner

## 2021-07-31 ENCOUNTER — Ambulatory Visit (INDEPENDENT_AMBULATORY_CARE_PROVIDER_SITE_OTHER): Payer: 59 | Admitting: Nurse Practitioner

## 2021-07-31 ENCOUNTER — Other Ambulatory Visit (HOSPITAL_COMMUNITY)
Admission: RE | Admit: 2021-07-31 | Discharge: 2021-07-31 | Disposition: A | Discharge status: Home / Self Care | Payer: 59 | Source: Ambulatory Visit | Attending: Nurse Practitioner | Admitting: Nurse Practitioner

## 2021-07-31 ENCOUNTER — Encounter: Payer: Self-pay | Admitting: Nurse Practitioner

## 2021-07-31 VITALS — BP 116/72 | Ht 63.0 in | Wt 93.0 lb

## 2021-07-31 DIAGNOSIS — Z3041 Encounter for surveillance of contraceptive pills: Secondary | ICD-10-CM

## 2021-07-31 DIAGNOSIS — Z113 Encounter for screening for infections with a predominantly sexual mode of transmission: Secondary | ICD-10-CM

## 2021-07-31 DIAGNOSIS — Z01419 Encounter for gynecological examination (general) (routine) without abnormal findings: Secondary | ICD-10-CM | POA: Diagnosis not present

## 2021-07-31 MED ORDER — NORETHIN ACE-ETH ESTRAD-FE 1-20 MG-MCG PO TABS: 1.0000 | ORAL_TABLET | Freq: Every day | ORAL | 3 refills | Status: DC

## 2021-07-31 NOTE — Progress Notes (Signed)
? ?  Katie Green Oct 15, 1985 [id number] ? ? ?History:  36 y.o. G0 presents for annual exam. Light cycle every few months on OCPs. 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 HSV, uses Valtrex as needed. ? ?Gynecologic History ?No LMP recorded. (Menstrual status: Oral contraceptives). ?  ?Contraception/Family planning: OCP (estrogen/progesterone) ?Sexually active: Yes ? ?Health Maintenance ?Last Pap: 06/25/2018. Results were: Normal, 3-year repeat ?Last mammogram: N/A ?Last colonoscopy:N/A ?Last Dexa: N/A ? ?Past medical history, past surgical history, family history and social history were all reviewed and documented in the EPIC chart. 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  ? ?ROS:  A ROS was performed and pertinent positives and negatives are included. ? ?Exam: ? ?Vitals:  ? 07/31/21 1520  ?BP: 116/72  ?Weight: 93 lb (42.2 kg)  ?Height: 5\' 3"  (1.6 m)  ? ? ?Body mass index is 16.47 kg/m?. ? ?General appearance:  Normal ?Thyroid:  Symmetrical, normal in size, without palpable masses or nodularity. ?Respiratory ? Auscultation:  Clear without wheezing or rhonchi ?Cardiovascular ? Auscultation:  Regular rate, without rubs, murmurs or gallops ? Edema/varicosities:  Not grossly evident ?Abdominal ? Soft,nontender, without masses, guarding or rebound. ? Liver/spleen:  No organomegaly noted ? Hernia:  None appreciated ? Skin ? Inspection:  Grossly normal ?  ?Breasts: Examined lying and sitting.  ? Right: Without masses, retractions, discharge or axillary adenopathy. ? ? Left: Without masses, retractions, discharge or axillary adenopathy. ?Genitourinary  ? Inguinal/mons:  Normal without inguinal adenopathy ? External genitalia:  Normal appearing vulva with no masses, tenderness, or lesions ? BUS/Urethra/Skene's glands:  Normal ? Vagina:  Normal appearing with normal color and discharge, no lesions ? Cervix:  Normal appearing without  discharge or lesions ? Uterus:  Normal in size, shape and contour.  Midline and mobile, nontender ? Adnexa/parametria:   ?  Rt: Normal in size, without masses or tenderness. ?  Lt: Normal in size, without masses or tenderness. ? Anus and perineum: Normal ? Digital rectal exam: Not indicated ? ?Patient informed chaperone available to be present for breast and pelvic exam. Patient has requested no chaperone to be present. Patient has been advised what will be completed during breast and pelvic exam.  ? ?Assessment/Plan:  36 y.o. G0 for annual exam.  ? ?Well female exam with routine gynecological exam - Plan: Cytology - PAP( [place]), CBC with Differential/Platelet, Comprehensive metabolic panel, Lipid panel. Education provided on SBEs, importance of preventative screenings, current guidelines, high calcium diet, regular exercise, and multivitamin daily.  ? ?Encounter for surveillance of contraceptive pills - Plan: norethindrone-ethinyl estradiol-FE (JUNEL FE 1/20) 1-20 MG-MCG tablet daily. Taking as prescribed. Refill x 1 year provided.  ? ?Screen for STD (sexually transmitted disease) - Plan: Cytology - PAP( [place]), RPR, HIV Antibody (routine testing w rflx) ? 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 Pap today.  ? ?Return in 1 year for annual. ? ? ? ? ?10/2011 DNP, 3:35 PM 07/31/2021 ? ?

## 2021-08-01 LAB — CYTOLOGY - PAP
Chlamydia: NEGATIVE
Comment: NEGATIVE
Comment: NEGATIVE
Comment: NEGATIVE
Comment: NORMAL
Diagnosis: NEGATIVE
High risk HPV: NEGATIVE
Neisseria Gonorrhea: NEGATIVE
Trichomonas: NEGATIVE

## 2021-08-01 LAB — CBC WITH DIFFERENTIAL/PLATELET
Absolute Monocytes: 369 cells/uL (ref 200–950)
Basophils Absolute: 47 cells/uL (ref 0–200)
Basophils Relative: 0.7 %
Eosinophils Absolute: 27 cells/uL (ref 15–500)
Eosinophils Relative: 0.4 %
HCT: 38.8 % (ref 35.0–45.0)
Hemoglobin: 13.1 g/dL (ref 11.7–15.5)
Lymphs Abs: 2419 cells/uL (ref 850–3900)
MCH: 32.3 pg (ref 27.0–33.0)
MCHC: 33.8 g/dL (ref 32.0–36.0)
MCV: 95.6 fL (ref 80.0–100.0)
MPV: 9.6 fL (ref 7.5–12.5)
Monocytes Relative: 5.5 %
Neutro Abs: 3839 cells/uL (ref 1500–7800)
Neutrophils Relative %: 57.3 %
Platelets: 453 10*3/uL — ABNORMAL HIGH (ref 140–400)
RBC: 4.06 10*6/uL (ref 3.80–5.10)
RDW: 11.8 % (ref 11.0–15.0)
Total Lymphocyte: 36.1 %
WBC: 6.7 10*3/uL (ref 3.8–10.8)

## 2021-08-01 LAB — COMPREHENSIVE METABOLIC PANEL
AG Ratio: 2.1 (calc) (ref 1.0–2.5)
ALT: 14 U/L (ref 6–29)
AST: 15 U/L (ref 10–30)
Albumin: 4.9 g/dL (ref 3.6–5.1)
Alkaline phosphatase (APISO): 37 U/L (ref 31–125)
BUN/Creatinine Ratio: 12 (calc) (ref 6–22)
BUN: 13 mg/dL (ref 7–25)
CO2: 24 mmol/L (ref 20–32)
Calcium: 9.7 mg/dL (ref 8.6–10.2)
Chloride: 102 mmol/L (ref 98–110)
Creat: 1.07 mg/dL — ABNORMAL HIGH (ref 0.50–0.97)
Globulin: 2.3 g/dL (calc) (ref 1.9–3.7)
Glucose, Bld: 66 mg/dL (ref 65–99)
Potassium: 3.9 mmol/L (ref 3.5–5.3)
Sodium: 137 mmol/L (ref 135–146)
Total Bilirubin: 0.5 mg/dL (ref 0.2–1.2)
Total Protein: 7.2 g/dL (ref 6.1–8.1)

## 2021-08-01 LAB — LIPID PANEL
Cholesterol: 203 mg/dL — ABNORMAL HIGH (ref <200)
HDL: 76 mg/dL (ref >=50)
LDL Cholesterol (Calc): 109 mg/dL (calc) — ABNORMAL HIGH
Non-HDL Cholesterol (Calc): 127 mg/dL (calc) (ref <130)
Total CHOL/HDL Ratio: 2.7 (calc) (ref <5.0)
Triglycerides: 88 mg/dL (ref <150)

## 2021-08-01 LAB — HIV ANTIBODY (ROUTINE TESTING W REFLEX): HIV 1&2 Ab, 4th Generation: NONREACTIVE

## 2021-08-01 LAB — RPR: RPR Ser Ql: NONREACTIVE

## 2022-07-30 DIAGNOSIS — D75839 Thrombocytosis, unspecified: Secondary | ICD-10-CM

## 2022-07-30 HISTORY — DX: Thrombocytosis, unspecified: D75.839

## 2022-07-31 ENCOUNTER — Other Ambulatory Visit: Payer: Self-pay | Admitting: Nurse Practitioner

## 2022-07-31 DIAGNOSIS — Z3041 Encounter for surveillance of contraceptive pills: Secondary | ICD-10-CM

## 2022-08-01 NOTE — Telephone Encounter (Signed)
Refill request received for Junel Fe 1/20. Last AEX 07/31/21. PAP 07/31/21, NILM, HPV negative. AEX scheduled 09/17/22. RF sent.

## 2022-08-22 ENCOUNTER — Encounter: Payer: Self-pay | Admitting: Family Medicine

## 2022-08-22 ENCOUNTER — Ambulatory Visit: Payer: 59 | Admitting: Family Medicine

## 2022-08-22 VITALS — BP 120/76 | HR 90 | Temp 97.8°F | Ht 63.0 in | Wt 93.8 lb

## 2022-08-22 DIAGNOSIS — D649 Anemia, unspecified: Secondary | ICD-10-CM | POA: Diagnosis not present

## 2022-08-22 DIAGNOSIS — D75839 Thrombocytosis, unspecified: Secondary | ICD-10-CM

## 2022-08-22 DIAGNOSIS — F1191 Opioid use, unspecified, in remission: Secondary | ICD-10-CM

## 2022-08-22 DIAGNOSIS — L700 Acne vulgaris: Secondary | ICD-10-CM

## 2022-08-22 DIAGNOSIS — E785 Hyperlipidemia, unspecified: Secondary | ICD-10-CM

## 2022-08-22 DIAGNOSIS — F909 Attention-deficit hyperactivity disorder, unspecified type: Secondary | ICD-10-CM

## 2022-08-22 DIAGNOSIS — R636 Underweight: Secondary | ICD-10-CM | POA: Diagnosis not present

## 2022-08-22 DIAGNOSIS — F419 Anxiety disorder, unspecified: Secondary | ICD-10-CM

## 2022-08-22 DIAGNOSIS — F3340 Major depressive disorder, recurrent, in remission, unspecified: Secondary | ICD-10-CM

## 2022-08-22 DIAGNOSIS — Z87442 Personal history of urinary calculi: Secondary | ICD-10-CM

## 2022-08-22 DIAGNOSIS — G5772 Causalgia of left lower limb: Secondary | ICD-10-CM

## 2022-08-22 HISTORY — DX: Hyperlipidemia, unspecified: E78.5

## 2022-08-22 NOTE — Assessment & Plan Note (Signed)
The patient has a BMI of 16.62 kg/m, with a family history of low BMI. No additional health issues related to underweight status noted.  Differential Diagnosis:  Constitutional thinness Malabsorption  Plan:  Order lab work to assess for nutritional deficiency as well as underlying metabolic or endocrine dysfunction Recommend a nutritional assessment and possibly consultation with a dietitian.

## 2022-08-22 NOTE — Assessment & Plan Note (Signed)
Patient has a longstanding history of anxiety and depression, currently managed with lorazepam and Viibryd. Patient reports current stability with her mental health medication regimen.  Plan:  Continue current medications: Viibryd 40 mg daily, lorazepam 1 mg as needed. Schedule follow-up appointment with psychiatry.

## 2022-08-22 NOTE — Assessment & Plan Note (Signed)
History of elevated cholesterol levels managed with lifestyle modifications.  Plan: Reassess lipid and associated restratification labs. Discuss lifestyle modifications and potential need for medical therapy based on lab results.

## 2022-08-22 NOTE — Assessment & Plan Note (Signed)
Patient symptoms stable on Adderall 20 mg and is managed by psychiatry.  Continue current medication regimen and regular follow-ups with psychiatry

## 2022-08-22 NOTE — Assessment & Plan Note (Addendum)
Intermittent pain in the left ankle  Plan:  Continue with current OTC medications as needed. Monitor pain levels and adjust management strategies if pain worsens.

## 2022-08-22 NOTE — Assessment & Plan Note (Signed)
History of elevated platelets, never fully investigated. No history of bleeding disorders, but the tendency to bruise easily is noted.  Plan:  Order CBC with differential, protime-INR, APTT, sedimentation rate, and C-reactive protein. Peripheral smear if results indicate ongoing elevation. Referral to hematology if abnormal findings persist.

## 2022-08-22 NOTE — Progress Notes (Signed)
Assessment/Plan:   Problem List Items Addressed This Visit       Nervous and Auditory   CRPS of left ankle    Intermittent pain in the left ankle  Plan:  Continue with current OTC medications as needed. Monitor pain levels and adjust management strategies if pain worsens.      Relevant Medications   VIIBRYD 40 MG TABS     Musculoskeletal and Integument   Acne vulgaris    Well-controlled.  Patient on spironolactone 50 mg for hormonal acne, prescribed by dermatology.        Hematopoietic and Hemostatic   Thrombocytosis    History of elevated platelets, never fully investigated. No history of bleeding disorders, but the tendency to bruise easily is noted.  Plan:  Order CBC with differential, protime-INR, APTT, sedimentation rate, and C-reactive protein. Peripheral smear if results indicate ongoing elevation. Referral to hematology if abnormal findings persist.      Relevant Orders   CBC w/Diff   Protime-INR   APTT   Sedimentation rate   C-reactive protein   Pathologist smear review     Other   Anxiety   Relevant Medications   VIIBRYD 40 MG TABS   Depression    Patient has Green longstanding history of anxiety and depression, currently managed with lorazepam and Viibryd. Patient reports current stability with her mental health medication regimen.  Plan:  Continue current medications: Viibryd 40 mg daily, lorazepam 1 mg as needed. Schedule follow-up appointment with psychiatry.      Relevant Medications   VIIBRYD 40 MG TABS   Opioid use disorder in remission - Primary   Underweight    The patient has Green BMI of 16.62 kg/m, with Green family history of low BMI. No additional health issues related to underweight status noted.  Differential Diagnosis:  Constitutional thinness Malabsorption  Plan:  Order lab work to assess for nutritional deficiency as well as underlying metabolic or endocrine dysfunction Recommend Green nutritional assessment and possibly  consultation with Green dietitian.      Relevant Orders   CBC w/Diff   Protime-INR   Vitamin D 1,25 dihydroxy   B12 and Folate Panel   Anemia    History of anemia, currently managed with ferrous sulfate. Anemia previously noted following kidney stone complications.  Plan:  Order CBC with differential, iron and TIBC, ferritin. Monitor hemoglobin and hematocrit levels for trend.      Relevant Orders   CBC w/Diff   Iron and TIBC   Ferritin   Hyperlipidemia    History of elevated cholesterol levels managed with lifestyle modifications.  Plan: Reassess lipid and associated restratification labs. Discuss lifestyle modifications and potential need for medical therapy based on lab results.      Relevant Orders   TSH   Lipid panel   Hemoglobin A1c   Microalbumin / creatinine urine ratio   Urinalysis, Routine w reflex microscopic   Comprehensive metabolic panel   Attention deficit hyperactivity disorder (ADHD)    Patient symptoms stable on Adderall 20 mg and is managed by psychiatry.  Continue current medication regimen and regular follow-ups with psychiatry      History of nephrolithiasis    Medications Discontinued During This Encounter  Medication Reason   citalopram (CELEXA) 40 MG tablet    diclofenac Sodium (VOLTAREN) 1 % GEL     Return in about 4 weeks (around 09/19/2022) for physical (fasting labs 1 week before).    Subjective:   Encounter date: 08/22/2022  Katie Green  Katie Green is Green 37 y.o. female who has Cervical dysplasia; HSV-2 (herpes simplex virus 2) infection; Anxiety; Foot pain, left; Leg length inequality; Nicotine dependence; Depression; CRPS of left ankle; Opioid use disorder in remission; Thrombocytosis; Underweight; Anemia; Hyperlipidemia; Attention deficit hyperactivity disorder (ADHD); Acne vulgaris; and History of nephrolithiasis on their problem list..   She  has Green past medical history of Anemia, Anxiety, Arthritis, Cervical dysplasia, Complex regional  pain syndrome, Depression, HSV-2 (herpes simplex virus 2) infection, Hyperlipidemia (08/22/2022), Kidney stones, and Nephrolithiasis (07/29/2016)..   Chief Complaint  Patient presents with   Establish Care     HISTORY OF PRESENT ILLNESS: Primary Care Establishment. The patient has not had an established primary care provider since her previous doctor retired several years ago. She has been managing her general health through her OB-GYN, who conducts her annual physicals and blood work. However, she now seeks Green PCP for regular care.   Mental Health. The patient has Green history of longstanding depression and anxiety. She reported Green particularly difficult year previously, leading to severe panic attacks and agoraphobia, for which medication adjustments were necessary. She is currently on medication under the care of Dr. Jeraldine Loots for ADHD (Adderall 20 mg daily), anxiety (lorazepam 1 mg as needed), and depression (Viibryd 40 mg daily), and she feels that her current regimen is effective.  Thrombocytosis. There is Green concern of elevated platelets which has been noted in previous labs. The patient has never seen hematology for this issue.  Anemia. History of anemia, which she supplements with ferrous sulfate 325 mg daily. The cause of her anemia was notably concerning after complications with kidney stone treatment.  Kidney Stones and Related Issues. The patient has Green history of nephrolithiasis with complications that led to significant clotting and anemia after stent removal.  Previous Opioid Use. History of opioid use following injury and subsequent chronic pain in the left foot, leading to eventual pain management. The patient is no longer on opioids but experiences residual pain managed occasionally with over-the-counter medications.  Underweight. Green BMI of 16.62 kg/m was noted, indicating underweight status, which is consistent with her family history.  Dermatology. Managed for hormonal acne  with spironolactone 50 mg daily, prescribed by her dermatologist.     08/22/2022    3:42 PM 11/08/2015    3:18 PM 03/16/2015    1:52 PM 09/27/2014    3:11 PM  Depression screen PHQ 2/9  Decreased Interest 0 2 3 2   Down, Depressed, Hopeless 0 2 3 2   PHQ - 2 Score 0 4 6 4   Altered sleeping  3 3 1   Tired, decreased energy  1 1 0  Change in appetite  0 0 0  Feeling bad or failure about yourself   3 1 2   Trouble concentrating  1 1 3   Moving slowly or fidgety/restless  1 1 1   Suicidal thoughts  2 3 1   PHQ-9 Score  15 16 12   Difficult doing work/chores   Somewhat difficult       08/22/2022    3:42 PM  GAD 7 : Generalized Anxiety Score  Nervous, Anxious, on Edge 1  Control/stop worrying 0  Worry too much - different things 1  Trouble relaxing 0  Restless 0  Easily annoyed or irritable 0  Afraid - awful might happen 0  Total GAD 7 Score 2  Anxiety Difficulty Not difficult at all      Review of Systems  Constitutional:  Negative for chills, diaphoresis, fever, malaise/fatigue and weight loss.  HENT:  Negative for congestion, ear discharge, ear pain and hearing loss.   Eyes:  Negative for blurred vision, double vision, photophobia, pain, discharge and redness.  Respiratory:  Negative for cough, sputum production, shortness of breath and wheezing.   Cardiovascular:  Negative for chest pain and palpitations.  Gastrointestinal:  Negative for abdominal pain, blood in stool, constipation, diarrhea, heartburn, melena, nausea and vomiting.  Genitourinary:  Negative for dysuria, flank pain, frequency, hematuria and urgency.  Musculoskeletal:  Positive for joint pain (chronic left ankle pain). Negative for myalgias.  Skin:  Negative for itching and rash.  Neurological:  Negative for dizziness, tingling, tremors, speech change, seizures, loss of consciousness, weakness and headaches.  Endo/Heme/Allergies:  Negative for environmental allergies and polydipsia. Bruises/bleeds easily.   Psychiatric/Behavioral:  Negative for depression, hallucinations, memory loss, substance abuse and suicidal ideas. The patient is not nervous/anxious and does not have insomnia.   All other systems reviewed and are negative.   Past Surgical History:  Procedure Laterality Date   CERVICAL BIOPSY  W/ LOOP ELECTRODE EXCISION  03/31/2010   high grade with free margins   GUM SURGERY      Outpatient Medications Prior to Visit  Medication Sig Dispense Refill   amphetamine-dextroamphetamine (ADDERALL) 20 MG tablet TAKE 1 TABLET BY MOUTH EVERY MORNING, AT NOON, AND AT 4PM  0   ferrous sulfate 325 (65 FE) MG EC tablet Take 325 mg by mouth daily with supper.      LORazepam (ATIVAN) 1 MG tablet Take 1 mg by mouth every 8 (eight) hours.     norethindrone-ethinyl estradiol-FE (JUNEL FE 1/20) 1-20 MG-MCG tablet TAKE 1 TABLET BY MOUTH DAILY 84 tablet 0   spironolactone (ALDACTONE) 50 MG tablet Take 50 mg by mouth daily.     valACYclovir (VALTREX) 500 MG tablet Take 1 tablet (500 mg total) by mouth 2 (two) times daily. with outbreak. 30 tablet 3   VIIBRYD 40 MG TABS      vitamin B-12 (CYANOCOBALAMIN) 1000 MCG tablet Take 1,000 mcg by mouth daily.      citalopram (CELEXA) 40 MG tablet Take 1 tablet (40 mg total) by mouth daily. (Patient taking differently: Take 40 mg by mouth daily. Take 1 1/2 pills) 90 tablet 1   diclofenac Sodium (VOLTAREN) 1 % GEL Apply 2 g topically 4 (four) times daily. (Patient not taking: Reported on 07/31/2021)     No facility-administered medications prior to visit.    Family History  Problem Relation Age of Onset   Hypertension Mother    Kidney Stones Mother    Hyperlipidemia Mother    Diabetes Mother    Anxiety disorder Mother    Kidney disease Mother    Heart attack Father    Diabetes Father    Hearing loss Father    Heart disease Father    Hyperlipidemia Maternal Grandmother    Hypertension Maternal Grandmother    Fibromyalgia Maternal Grandmother    Dementia  Maternal Grandmother    Arthritis Maternal Grandmother    Hypertension Maternal Grandfather    Hyperlipidemia Maternal Grandfather    Aneurysm Maternal Grandfather    Deep vein thrombosis Maternal Grandfather    Hearing loss Maternal Grandfather    Emphysema Paternal Grandmother        Smoker   Stroke Paternal Grandmother    Heart disease Paternal Grandfather    Hyperlipidemia Paternal Grandfather    CAD Paternal Grandfather    Emphysema Paternal Grandfather    COPD Paternal Grandfather  Quit smoking 30-years-ago   Aneurysm Paternal Grandfather    Arthritis Paternal Grandfather    Anxiety disorder Sister     Social History   Socioeconomic History   Marital status: Single    Spouse name: Not on file   Number of children: Not on file   Years of education: Not on file   Highest education level: Not on file  Occupational History   Not on file  Tobacco Use   Smoking status: Former    Packs/day: 1.00    Years: 17.00    Additional pack years: 0.00    Total pack years: 17.00    Types: Cigarettes, E-cigarettes    Quit date: 01/29/2018    Years since quitting: 4.5    Passive exposure: Never   Smokeless tobacco: Never   Tobacco comments:    Quit 2019  Vaping Use   Vaping Use: Some days  Substance and Sexual Activity   Alcohol use: Yes    Alcohol/week: 1.0 standard drink of alcohol    Types: 1 Standard drinks or equivalent per week    Comment: Just on occasion   Drug use: No   Sexual activity: Yes    Birth control/protection: Pill    Comment: 1st intercourse 37 yo-More than 5 partners  Other Topics Concern   Not on file  Social History Narrative   Not on file   Social Determinants of Health   Financial Resource Strain: Not on file  Food Insecurity: Not on file  Transportation Needs: Not on file  Physical Activity: Not on file  Stress: Not on file  Social Connections: Not on file  Intimate Partner Violence: Not on file                                                                                                   Objective:  Physical Exam: BP 120/76 (BP Location: Right Arm, Patient Position: Sitting, Cuff Size: Large)   Pulse 90   Temp 97.8 F (36.6 C) (Temporal)   Ht 5\' 3"  (1.6 m)   Wt 93 lb 12.8 oz (42.5 kg)   SpO2 99%   BMI 16.62 kg/m     Physical Exam Constitutional:      Appearance: Normal appearance.  HENT:     Head: Normocephalic and atraumatic.     Right Ear: Hearing normal.     Left Ear: Hearing normal.     Nose: Nose normal.  Eyes:     General: No scleral icterus.       Right eye: No discharge.        Left eye: No discharge.     Extraocular Movements: Extraocular movements intact.  Cardiovascular:     Comments: No cyanosis, no JVD Pulmonary:     Effort: Pulmonary effort is normal.     Comments: No auditory wheezing Musculoskeletal:     Comments: Normal Ambulation. No clubbing  Skin:    General: Skin is warm.     Findings: No acne or rash.  Neurological:     General: No focal deficit present.     Mental Status:  She is alert.     Cranial Nerves: No cranial nerve deficit.  Psychiatric:        Mood and Affect: Mood normal.        Behavior: Behavior normal.        Thought Content: Thought content normal.        Judgment: Judgment normal.     No results found.  No results found for this or any previous visit (from the past 2160 hour(s)).      Garner Nash, MD, MS

## 2022-08-22 NOTE — Assessment & Plan Note (Signed)
History of anemia, currently managed with ferrous sulfate. Anemia previously noted following kidney stone complications.  Plan:  Order CBC with differential, iron and TIBC, ferritin. Monitor hemoglobin and hematocrit levels for trend.

## 2022-08-22 NOTE — Assessment & Plan Note (Signed)
Well-controlled.  Patient on spironolactone 50 mg for hormonal acne, prescribed by dermatology.

## 2022-09-12 ENCOUNTER — Other Ambulatory Visit: Payer: 59

## 2022-09-17 ENCOUNTER — Encounter: Payer: Self-pay | Admitting: Nurse Practitioner

## 2022-09-17 ENCOUNTER — Other Ambulatory Visit (INDEPENDENT_AMBULATORY_CARE_PROVIDER_SITE_OTHER): Payer: 59

## 2022-09-17 ENCOUNTER — Ambulatory Visit (INDEPENDENT_AMBULATORY_CARE_PROVIDER_SITE_OTHER): Payer: 59 | Admitting: Nurse Practitioner

## 2022-09-17 VITALS — BP 118/84 | HR 92 | Ht 63.0 in | Wt 98.0 lb

## 2022-09-17 DIAGNOSIS — Z3041 Encounter for surveillance of contraceptive pills: Secondary | ICD-10-CM

## 2022-09-17 DIAGNOSIS — Z01419 Encounter for gynecological examination (general) (routine) without abnormal findings: Secondary | ICD-10-CM

## 2022-09-17 DIAGNOSIS — R8271 Bacteriuria: Secondary | ICD-10-CM

## 2022-09-17 DIAGNOSIS — D75839 Thrombocytosis, unspecified: Secondary | ICD-10-CM | POA: Diagnosis not present

## 2022-09-17 DIAGNOSIS — R636 Underweight: Secondary | ICD-10-CM | POA: Diagnosis not present

## 2022-09-17 DIAGNOSIS — D649 Anemia, unspecified: Secondary | ICD-10-CM | POA: Diagnosis not present

## 2022-09-17 DIAGNOSIS — E785 Hyperlipidemia, unspecified: Secondary | ICD-10-CM | POA: Diagnosis not present

## 2022-09-17 LAB — MICROALBUMIN / CREATININE URINE RATIO
Creatinine,U: 235.6 mg/dL
Microalb Creat Ratio: 2.7 mg/g (ref 0.0–30.0)
Microalb, Ur: 6.5 mg/dL — ABNORMAL HIGH (ref 0.0–1.9)

## 2022-09-17 LAB — URINALYSIS, ROUTINE W REFLEX MICROSCOPIC
Bilirubin Urine: NEGATIVE
Hgb urine dipstick: NEGATIVE
Ketones, ur: NEGATIVE
Nitrite: NEGATIVE
Specific Gravity, Urine: 1.025 (ref 1.000–1.030)
Urine Glucose: NEGATIVE
Urobilinogen, UA: 0.2 (ref 0.0–1.0)
pH: 6 (ref 5.0–8.0)

## 2022-09-17 LAB — CBC WITH DIFFERENTIAL/PLATELET
Basophils Absolute: 0.1 10*3/uL (ref 0.0–0.1)
Basophils Relative: 1.1 % (ref 0.0–3.0)
Eosinophils Absolute: 0.1 10*3/uL (ref 0.0–0.7)
Eosinophils Relative: 0.9 % (ref 0.0–5.0)
HCT: 36.3 % (ref 36.0–46.0)
Hemoglobin: 12.4 g/dL (ref 12.0–15.0)
Lymphocytes Relative: 43 % (ref 12.0–46.0)
Lymphs Abs: 2.5 10*3/uL (ref 0.7–4.0)
MCHC: 34.2 g/dL (ref 30.0–36.0)
MCV: 95 fl (ref 78.0–100.0)
Monocytes Absolute: 0.3 10*3/uL (ref 0.1–1.0)
Monocytes Relative: 5.8 % (ref 3.0–12.0)
Neutro Abs: 2.9 10*3/uL (ref 1.4–7.7)
Neutrophils Relative %: 49.2 % (ref 43.0–77.0)
Platelets: 431 10*3/uL — ABNORMAL HIGH (ref 150.0–400.0)
RBC: 3.82 Mil/uL — ABNORMAL LOW (ref 3.87–5.11)
RDW: 12.3 % (ref 11.5–15.5)
WBC: 5.8 10*3/uL (ref 4.0–10.5)

## 2022-09-17 LAB — COMPREHENSIVE METABOLIC PANEL
ALT: 9 U/L (ref 0–35)
AST: 13 U/L (ref 0–37)
Albumin: 4.4 g/dL (ref 3.5–5.2)
Alkaline Phosphatase: 29 U/L — ABNORMAL LOW (ref 39–117)
BUN: 13 mg/dL (ref 6–23)
CO2: 25 mEq/L (ref 19–32)
Calcium: 9.2 mg/dL (ref 8.4–10.5)
Chloride: 103 mEq/L (ref 96–112)
Creatinine, Ser: 1.29 mg/dL — ABNORMAL HIGH (ref 0.40–1.20)
GFR: 52.32 mL/min — ABNORMAL LOW (ref >60.00)
Glucose, Bld: 125 mg/dL — ABNORMAL HIGH (ref 70–99)
Potassium: 3.6 mEq/L (ref 3.5–5.1)
Sodium: 137 mEq/L (ref 135–145)
Total Bilirubin: 0.5 mg/dL (ref 0.2–1.2)
Total Protein: 6.7 g/dL (ref 6.0–8.3)

## 2022-09-17 LAB — LIPID PANEL
Cholesterol: 182 mg/dL (ref 0–200)
HDL: 66.2 mg/dL (ref >39.00)
LDL Cholesterol: 95 mg/dL (ref 0–99)
NonHDL: 115.8
Total CHOL/HDL Ratio: 3
Triglycerides: 103 mg/dL (ref 0.0–149.0)
VLDL: 20.6 mg/dL (ref 0.0–40.0)

## 2022-09-17 LAB — SEDIMENTATION RATE: Sed Rate: 4 mm/hr (ref 0–20)

## 2022-09-17 LAB — FERRITIN: Ferritin: 101 ng/mL (ref 10.0–291.0)

## 2022-09-17 LAB — TSH: TSH: 3.34 u[IU]/mL (ref 0.35–5.50)

## 2022-09-17 LAB — C-REACTIVE PROTEIN: CRP: <1 mg/dL (ref 0.5–20.0)

## 2022-09-17 LAB — HEMOGLOBIN A1C: Hgb A1c MFr Bld: 5.3 % (ref 4.6–6.5)

## 2022-09-17 LAB — B12 AND FOLATE PANEL
Folate: 7.3 ng/mL (ref >5.9)
Vitamin B-12: 667 pg/mL (ref 211–911)

## 2022-09-17 MED ORDER — NORETHIN ACE-ETH ESTRAD-FE 1-20 MG-MCG PO TABS: 1.0000 | ORAL_TABLET | Freq: Every day | ORAL | 3 refills | Status: DC

## 2022-09-17 NOTE — Progress Notes (Signed)
   Katie Green 1985-05-16 [id number]   History:  37 y.o. G0 presents for annual exam. Light cycle every few months on OCPs. 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 HSV, uses Valtrex as needed, rare outbreaks.   Gynecologic History Patient's last menstrual period was 08/27/2022.   Contraception/Family planning: OCP (estrogen/progesterone) Sexually active: Yes, declines STD screening  Health Maintenance Last Pap: 07/31/2021. Results were: Normal neg HPV, 3-year repeat Last mammogram: Not indicated Last colonoscopy: Not indicated Last Dexa: Not indicated  Past medical history, past surgical history, family history and social history were all reviewed and documented in the EPIC chart. 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 Currently living with sister, helps a lot with kids.   ROS:  A ROS was performed and pertinent positives and negatives are included.  Exam:  Vitals:   09/17/22 1103  BP: 118/84  Pulse: 92  SpO2: 100%  Weight: 98 lb (44.5 kg)  Height: 5\' 3"  (1.6 m)     Body mass index is 17.36 kg/m.  General appearance:  Normal Thyroid:  Symmetrical, normal in size, without palpable masses or nodularity. Respiratory  Auscultation:  Clear without wheezing or rhonchi Cardiovascular  Auscultation:  Regular rate, without rubs, murmurs or gallops  Edema/varicosities:  Not grossly evident Abdominal  Soft,nontender, without masses, guarding or rebound.  Liver/spleen:  No organomegaly noted  Hernia:  None appreciated  Skin  Inspection:  Grossly normal Breasts: Examined lying and sitting.   Right: Without masses, retractions, discharge or axillary adenopathy.   Left: Without masses, retractions, discharge or axillary adenopathy. Genitourinary   Inguinal/mons:  Normal without inguinal adenopathy  External genitalia:  Normal appearing vulva with no masses, tenderness, or  lesions  BUS/Urethra/Skene's glands:  Normal  Vagina:  Normal appearing with normal color and discharge, no lesions  Cervix:  Normal appearing without discharge or lesions  Uterus:  Normal in size, shape and contour.  Midline and mobile, nontender  Adnexa/parametria:     Rt: Normal in size, without masses or tenderness.   Lt: Normal in size, without masses or tenderness.  Anus and perineum: Normal  Digital rectal exam: Not indicated  Patient informed chaperone available to be present for breast and pelvic exam. Patient has requested no chaperone to be present. Patient has been advised what will be completed during breast and pelvic exam.   Assessment/Plan:  37 y.o. G0 for annual exam.   Well female exam with routine gynecological exam - Education provided on SBEs, importance of preventative screenings, current guidelines, high calcium diet, regular exercise, and multivitamin daily. Labs with PCP this morning.   Encounter for surveillance of contraceptive pills - Plan: norethindrone-ethinyl estradiol-FE (JUNEL FE 1/20) 1-20 MG-MCG tablet daily. Taking as prescribed. Refill x 1 year provided.   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 Will repeat at 3-year interval per guidelines.   Return in 1 year for annual.     Olivia Mackie DNP, 11:25 AM 09/17/2022

## 2022-09-18 ENCOUNTER — Other Ambulatory Visit: Payer: Self-pay

## 2022-09-18 DIAGNOSIS — D75839 Thrombocytosis, unspecified: Secondary | ICD-10-CM

## 2022-09-18 LAB — IRON AND TIBC
Iron Saturation: 32 % (ref 15–55)
Iron: 140 ug/dL (ref 27–159)
Total Iron Binding Capacity: 438 ug/dL (ref 250–450)
UIBC: 298 ug/dL (ref 131–425)

## 2022-09-18 MED ORDER — NORETHIN ACE-ETH ESTRAD-FE 1-20 MG-MCG PO TABS
1.0000 | ORAL_TABLET | Freq: Every day | ORAL | 3 refills | Status: DC
Start: 2022-09-18 — End: 2023-08-11

## 2022-09-18 NOTE — Telephone Encounter (Signed)
VM left on [facility] line informing them to discontinue rx sent for pt's Junel FE and rx resent to mail order. Will route to provider for final review and close.

## 2022-09-18 NOTE — Addendum Note (Signed)
Addended by: Lindley Magnus L on: 09/18/2022 10:46 AM   Modules accepted: Orders

## 2022-09-18 NOTE — Addendum Note (Signed)
Addended by: Fanny Bien B on: 09/18/2022 01:02 PM   Modules accepted: Orders

## 2022-09-19 ENCOUNTER — Other Ambulatory Visit: Payer: 59

## 2022-09-19 ENCOUNTER — Ambulatory Visit (INDEPENDENT_AMBULATORY_CARE_PROVIDER_SITE_OTHER): Payer: 59 | Admitting: Family Medicine

## 2022-09-19 ENCOUNTER — Encounter: Payer: Self-pay | Admitting: Family Medicine

## 2022-09-19 VITALS — BP 130/78 | HR 77 | Temp 98.0°F | Wt 95.0 lb

## 2022-09-19 DIAGNOSIS — F3340 Major depressive disorder, recurrent, in remission, unspecified: Secondary | ICD-10-CM

## 2022-09-19 DIAGNOSIS — Z0000 Encounter for general adult medical examination without abnormal findings: Secondary | ICD-10-CM | POA: Diagnosis not present

## 2022-09-19 DIAGNOSIS — D75839 Thrombocytosis, unspecified: Secondary | ICD-10-CM

## 2022-09-19 DIAGNOSIS — G5772 Causalgia of left lower limb: Secondary | ICD-10-CM | POA: Diagnosis not present

## 2022-09-19 DIAGNOSIS — N879 Dysplasia of cervix uteri, unspecified: Secondary | ICD-10-CM | POA: Diagnosis not present

## 2022-09-19 DIAGNOSIS — F17210 Nicotine dependence, cigarettes, uncomplicated: Secondary | ICD-10-CM | POA: Diagnosis not present

## 2022-09-19 DIAGNOSIS — F1191 Opioid use, unspecified, in remission: Secondary | ICD-10-CM

## 2022-09-19 DIAGNOSIS — Z23 Encounter for immunization: Secondary | ICD-10-CM | POA: Diagnosis not present

## 2022-09-19 DIAGNOSIS — R7989 Other specified abnormal findings of blood chemistry: Secondary | ICD-10-CM

## 2022-09-19 DIAGNOSIS — E785 Hyperlipidemia, unspecified: Secondary | ICD-10-CM

## 2022-09-19 DIAGNOSIS — F909 Attention-deficit hyperactivity disorder, unspecified type: Secondary | ICD-10-CM

## 2022-09-19 DIAGNOSIS — R8271 Bacteriuria: Secondary | ICD-10-CM

## 2022-09-19 LAB — PROTIME-INR
INR: 0.9 ratio (ref 0.8–1.0)
Prothrombin Time: 9.6 s (ref 9.6–13.1)

## 2022-09-19 LAB — APTT: aPTT: 27.3 s (ref 25.4–36.8)

## 2022-09-19 MED ORDER — DICLOFENAC SODIUM 1 % EX GEL
4.0000 g | Freq: Four times a day (QID) | CUTANEOUS | 3 refills | Status: DC | PRN
Start: 2022-09-19 — End: 2022-12-19

## 2022-09-19 NOTE — Assessment & Plan Note (Signed)
Trial diclofenac gel

## 2022-09-19 NOTE — Assessment & Plan Note (Addendum)
Chronic pain with limitation in shoulder movement post-accident. Plan for neck X-ray to evaluate further. Continue chiropractic care and dry needling. Trial diclofenac gel

## 2022-09-19 NOTE — Progress Notes (Signed)
Assessment  Assessment/Plan:   Problem List Items Addressed This Visit       Nervous and Auditory   CRPS of left ankle    Trial diclofenac gel      Relevant Medications   diclofenac Sodium (VOLTAREN) 1 % GEL     Genitourinary   Cervical dysplasia    Chronic pain with limitation in shoulder movement post-accident. Plan for neck X-ray to evaluate further. Continue chiropractic care and dry needling. Trial diclofenac gel      Relevant Medications   diclofenac Sodium (VOLTAREN) 1 % GEL   Other Relevant Orders   DG Neck Soft Tissue     Hematopoietic and Hemostatic   Thrombocytosis    Slightly improved but still elevated platelet count (430). Has been referred to hematology        Other   Nicotine dependence - Primary   Relevant Orders   Pneumococcal conjugate vaccine 20-valent (Completed)   Depression   Opioid use disorder in remission   Hyperlipidemia   Attention deficit hyperactivity disorder (ADHD)   Other Visit Diagnoses     Encounter for well adult exam without abnormal findings       Immunization due       Relevant Orders   Pneumococcal conjugate vaccine 20-valent (Completed)   Elevated serum creatinine       Relevant Orders   Basic Metabolic Panel (BMET)   Asymptomatic bacteriuria           There are no discontinued medications.  Patient Counseling(The following topics were reviewed and/or handout was given):  -Nutrition: Stressed importance of moderation in sodium/caffeine intake, saturated fat and cholesterol, caloric balance, sufficient intake of fresh fruits, vegetables, and fiber.  -Stressed the importance of regular exercise.   -Substance Abuse: Discussed cessation/primary prevention of tobacco, alcohol, or other drug use; driving or other dangerous activities under the influence; availability of treatment for abuse.   -Injury prevention: Discussed safety belts, safety helmets, smoke detector, smoking near bedding or upholstery.    -Sexuality: Discussed sexually transmitted diseases, partner selection, use of condoms, avoidance of unintended pregnancy and contraceptive alternatives.   -Dental health: Discussed importance of regular tooth brushing, flossing, and dental visits.  -Health maintenance and immunizations reviewed. Please refer to Health maintenance section.  Return to care in 1 year for next preventative visit.       Subjective:  Chief complaint Encounter date: 09/19/2022  Chief Complaint  Patient presents with   Annual Exam   Katie Green is a 37 y.o. female who presents today for her annual comprehensive physical exam.    Thrombocytosis - Patient has a history of ongoing thrombocytosis. The recent CBC showed a slight improvement in platelet count of 430.  Patient denies any history of easy bruising, or bleeding.  Denies any history of blood clots.  Hyperlipidemia - Patient has had hyperlipidemia but recent labs show improvement.  Underweight - Patient has a BMI of 16.8, with stable weight of 95 lbs. She has been under 100 lbs for her entire life.  Anxiety and Depression - Managed with fluoxetine 40 mg daily and lorazepam 1 mg every 8 hours PRN.  ADHD - Managed with Adderall 20 mg instant release TID.  Opioid Use Disorder. No current opioid use, patient in remission.  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  Managed by nonmedical means such as stretches, home physical therapy, heat and ice.    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  Creatinine Elevation - Slightly elevated creatinine noted (1.9) with GFR 52.  Acne - Controlled on Spironolactone 50 mg.  Follows with dermatology.  Neck Pain.  Intermittent neck pain originating from an accident 2 years ago.  Recent flare due to unknown cause for the past 2 weeks.  Ongoing pain despite interventions steroid injections and physical therapy. Seeing chiropractics and doing dry needling with  improvement. Currently not on oral medications for pain.  Patient has history of smoking where she recently quit 2 years ago.  Has no cough or shortness of breath.  Has not been vaccinated against pneumonia and does work in healthcare setting.  Review of Systems  Constitutional:  Negative for chills, diaphoresis, fever, malaise/fatigue and weight loss.  HENT:  Negative for congestion, ear discharge, ear pain and hearing loss.   Eyes:  Negative for blurred vision, double vision, photophobia, pain, discharge and redness.  Respiratory:  Negative for cough, sputum production, shortness of breath and wheezing.   Cardiovascular:  Negative for chest pain and palpitations.  Gastrointestinal:  Negative for abdominal pain, blood in stool, constipation, diarrhea, heartburn, melena, nausea and vomiting.  Genitourinary:  Negative for dysuria, flank pain, frequency, hematuria and urgency.  Musculoskeletal:  Positive for joint pain and neck pain. Negative for myalgias.  Skin:  Negative for itching and rash.  Neurological:  Negative for dizziness, tingling, tremors, speech change, seizures, loss of consciousness, weakness and headaches.  Psychiatric/Behavioral:  Negative for depression, hallucinations, memory loss, substance abuse and suicidal ideas. The patient does not have insomnia.   All other systems reviewed and are negative.      09/19/2022    2:59 PM 08/22/2022    3:42 PM  GAD-7 Generalized Anxiety Disorder Screening Tool  1. Feeling Nervous, Anxious, or on Edge 0 1  2. Not Being Able to Stop or Control Worrying 0 0  3. Worrying Too Much About Different Things 0 1  4. Trouble Relaxing 0 0  5. Being So Restless it's Hard To Sit Still 0 0  6. Becoming Easily Annoyed or Irritable 0 0  7. Feeling Afraid As If Something Awful Might Happen 0 0  Total GAD-7 Score 0 2  Difficulty At Work, Home, or Getting  Along With Others? Not difficult at all Not difficult at all      09/19/2022    2:58 PM  08/22/2022    3:42 PM 11/08/2015    3:18 PM 03/16/2015    1:52 PM 09/27/2014    3:11 PM  Depression screen PHQ 2/9  Decreased Interest 0 0 2 3 2   Down, Depressed, Hopeless 0 0 2 3 2   PHQ - 2 Score 0 0 4 6 4   Altered sleeping 0  3 3 1   Tired, decreased energy 0  1 1 0  Change in appetite 0  0 0 0  Feeling bad or failure about yourself  0  3 1 2   Trouble concentrating 0  1 1 3   Moving slowly or fidgety/restless 0  1 1 1   Suicidal thoughts 0  2 3 1   PHQ-9 Score 0  15 16 12   Difficult doing work/chores Not difficult at all   Somewhat difficult     There are no preventive care reminders to display for this patient.   PMH:  The following were reviewed and entered/updated in epic: Past Medical History:  Diagnosis Date   Anemia    Anxiety    Arthritis    Cervical dysplasia    LEEP  for high grade 03/2010 margins clear, lgsil pap/C&B 09/2010, ASCUS neg HR HPV pap 04/2011   Complex regional pain syndrome    Left foot   Depression    HSV-2 (herpes simplex virus 2) infection    Hyperlipidemia 08/22/2022   Kidney stones    Nephrolithiasis 07/29/2016   Confirmed multiple stones via CT scan    Patient Active Problem List   Diagnosis Date Noted   Opioid use disorder in remission 08/22/2022   Thrombocytosis 08/22/2022   Underweight 08/22/2022   Anemia 08/22/2022   Hyperlipidemia 08/22/2022   Attention deficit hyperactivity disorder (ADHD) 08/22/2022   Acne vulgaris 08/22/2022   History of nephrolithiasis 08/22/2022   Cervical dysplasia 11/08/2016   Anxiety 09/01/2016   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   Depression 10/01/2011   Nicotine dependence 07/02/2011   Foot pain, left 07/01/2011   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   HSV-2 (herpes simplex virus 2) infection     Past Surgical History:  Procedure Laterality Date   CERVICAL BIOPSY  W/ LOOP ELECTRODE EXCISION  03/31/2010   high grade with free margins   GUM SURGERY      Family History  Problem Relation Age of Onset    Hypertension Mother    Kidney Stones Mother    Hyperlipidemia Mother    Diabetes Mother    Anxiety disorder Mother    Kidney disease Mother    Heart attack Father    Diabetes Father    Hearing loss Father    Heart disease Father    Hyperlipidemia Maternal Grandmother    Hypertension Maternal Grandmother    Fibromyalgia Maternal Grandmother    Dementia Maternal Grandmother    Arthritis Maternal Grandmother    Hypertension Maternal Grandfather    Hyperlipidemia Maternal Grandfather    Aneurysm Maternal Grandfather    Deep vein thrombosis Maternal Grandfather    Hearing loss Maternal Grandfather    Emphysema Paternal Grandmother        Smoker   Stroke Paternal Grandmother    Heart disease Paternal Grandfather    Hyperlipidemia Paternal Grandfather    CAD Paternal Grandfather    Emphysema Paternal Grandfather    COPD Paternal Grandfather        Quit smoking 30-years-ago   Aneurysm Paternal Grandfather    Arthritis Paternal Grandfather    Anxiety disorder Sister     Medications- reviewed and updated Outpatient Medications Prior to Visit  Medication Sig Dispense Refill   amphetamine-dextroamphetamine (ADDERALL) 20 MG tablet TAKE 1 TABLET BY MOUTH EVERY MORNING, AT NOON, AND AT 4PM  0   LORazepam (ATIVAN) 1 MG tablet Take 1 mg by mouth every 8 (eight) hours.     norethindrone-ethinyl estradiol-FE (JUNEL FE 1/20) 1-20 MG-MCG tablet Take 1 tablet by mouth daily. 84 tablet 3   spironolactone (ALDACTONE) 50 MG tablet Take 50 mg by mouth daily.     valACYclovir (VALTREX) 500 MG tablet Take 1 tablet (500 mg total) by mouth 2 (two) times daily. with outbreak. 30 tablet 3   VIIBRYD 40 MG TABS      vitamin B-12 (CYANOCOBALAMIN) 1000 MCG tablet Take 1,000 mcg by mouth daily.      ferrous sulfate 325 (65 FE) MG EC tablet Take 325 mg by mouth daily with supper.  (Patient not taking: Reported on 09/19/2022)     No facility-administered medications prior to visit.    No Known  Allergies  Social History   Socioeconomic History   Marital status: Single  Spouse name: Not on file   Number of children: Not on file   Years of education: Not on file   Highest education level: Not on file  Occupational History   Not on file  Tobacco Use   Smoking status: Former    Packs/day: 1.00    Years: 17.00    Additional pack years: 0.00    Total pack years: 17.00    Types: Cigarettes, E-cigarettes    Quit date: 01/29/2018    Years since quitting: 4.6    Passive exposure: Never   Smokeless tobacco: Never   Tobacco comments:    Quit 2019  Vaping Use   Vaping Use: Some days  Substance and Sexual Activity   Alcohol use: Yes    Alcohol/week: 1.0 standard drink of alcohol    Types: 1 Standard drinks or equivalent per week    Comment: Just on occasion   Drug use: No   Sexual activity: Yes    Birth control/protection: Pill    Comment: 1st intercourse 37 yo-More than 5 partners  Other Topics Concern   Not on file  Social History Narrative   Not on file   Social Determinants of Health   Financial Resource Strain: Not on file  Food Insecurity: Not on file  Transportation Needs: Not on file  Physical Activity: Not on file  Stress: Not on file  Social Connections: Not on file        Objective:  Physical Exam: BP 130/78 (BP Location: Left Arm, Patient Position: Sitting, Cuff Size: Large)   Pulse 77   Temp 98 F (36.7 C) (Temporal)   Wt 95 lb (43.1 kg)   LMP 08/27/2022   SpO2 99%   BMI 16.83 kg/m   Body mass index is 16.83 kg/m. Wt Readings from Last 3 Encounters:  09/19/22 95 lb (43.1 kg)  09/17/22 98 lb (44.5 kg)  08/22/22 93 lb 12.8 oz (42.5 kg)    Physical Exam Constitutional:      General: She is not in acute distress.    Appearance: Normal appearance. She is not ill-appearing or toxic-appearing.  HENT:     Head: Normocephalic and atraumatic.     Right Ear: Hearing, tympanic membrane, ear canal and external ear normal. There is no impacted  cerumen.     Left Ear: Hearing, tympanic membrane, ear canal and external ear normal. There is no impacted cerumen.     Nose: Nose normal. No congestion.     Mouth/Throat:     Lips: No lesions.     Mouth: Mucous membranes are moist.     Pharynx: Oropharynx is clear. No oropharyngeal exudate.  Eyes:     General: No scleral icterus.       Right eye: No discharge.        Left eye: No discharge.     Conjunctiva/sclera: Conjunctivae normal.     Pupils: Pupils are equal, round, and reactive to light.  Neck:     Thyroid: No thyroid mass, thyromegaly or thyroid tenderness.  Cardiovascular:     Rate and Rhythm: Normal rate and regular rhythm.     Pulses: Normal pulses.     Heart sounds: Normal heart sounds.  Pulmonary:     Effort: Pulmonary effort is normal. No respiratory distress.     Breath sounds: Normal breath sounds.  Abdominal:     General: Abdomen is flat. Bowel sounds are normal.     Palpations: Abdomen is soft.  Musculoskeletal:     Right  shoulder: No swelling, deformity or tenderness. Decreased range of motion (difficulty raising arm over above shoulder due to pain).     Left shoulder: Decreased range of motion: difficulty raising arm over above shoulder due to pain.     Cervical back: Normal range of motion.     Right lower leg: No edema.     Left lower leg: No edema.  Lymphadenopathy:     Cervical: No cervical adenopathy.  Skin:    General: Skin is warm and dry.     Findings: No rash.  Neurological:     General: No focal deficit present.     Mental Status: She is alert and oriented to person, place, and time. Mental status is at baseline.     Deep Tendon Reflexes:     Reflex Scores:      Patellar reflexes are 2+ on the right side and 2+ on the left side. Psychiatric:        Mood and Affect: Mood normal.        Behavior: Behavior normal.        Thought Content: Thought content normal.        Judgment: Judgment normal.      Lab Interpretation:  Comprehensive  Metabolic Panel:  Glucose is elevated at 125 mg/dL. Creatinine is slightly elevated at 1.29 mg/dL. Alkaline Phosphatase is low at 29 U/L. GFR is reduced to 52.32 mL/min, indicating impaired kidney function.  Urinalysis:  The urine has a cloudy appearance. There are trace amounts of protein and leukocytes. There are 11-20 WBC/hpf. There are 3-6 RBC/hpf. The urine contains many mucus, squamous epithelial cells, and bacteria. Microalbumin/Creatinine Ratio: The microalbumin level is high at 6.5 mg/dL.  Hemoglobin A1c: The level is normal at 5.3%.  Other Panels: The lipid panel, TSH, ferritin, iron and TIBC, B12 and folate panel, C-reactive protein, and sedimentation rate are all normal.  Complete Blood Count: RBC is slightly low at 3.82 Mil/uL. Platelets are marginally elevated at 431 K/uL. Protime-INR and aPTT: These levels are normal.    Recent Results (from the past 2160 hour(s))  Comprehensive metabolic panel     Status: Abnormal   Collection Time: 09/17/22  8:13 AM  Result Value Ref Range   Sodium 137 135 - 145 mEq/L   Potassium 3.6 3.5 - 5.1 mEq/L   Chloride 103 96 - 112 mEq/L   CO2 25 19 - 32 mEq/L   Glucose, Bld 125 (H) 70 - 99 mg/dL   BUN 13 6 - 23 mg/dL   Creatinine, Ser 4.09 (H) 0.40 - 1.20 mg/dL   Total Bilirubin 0.5 0.2 - 1.2 mg/dL   Alkaline Phosphatase 29 (L) 39 - 117 U/L   AST 13 0 - 37 U/L   ALT 9 0 - 35 U/L   Total Protein 6.7 6.0 - 8.3 g/dL   Albumin 4.4 3.5 - 5.2 g/dL   GFR 81.19 (L) >14.78 mL/min    Comment: Calculated using the CKD-EPI Creatinine Equation (2021)   Calcium 9.2 8.4 - 10.5 mg/dL  Urinalysis, Routine w reflex microscopic     Status: Abnormal   Collection Time: 09/17/22  8:13 AM  Result Value Ref Range   Color, Urine YELLOW Yellow;Lt. Yellow;Straw;Dark Yellow;Amber;Green;Red;Brown   APPearance Cloudy (A) Clear;Turbid;Slightly Cloudy;Cloudy   Specific Gravity, Urine 1.025 1.000 - 1.030   pH 6.0 5.0 - 8.0   Total Protein, Urine TRACE (A)  Negative   Urine Glucose NEGATIVE Negative   Ketones, ur NEGATIVE Negative   Bilirubin Urine  NEGATIVE Negative   Hgb urine dipstick NEGATIVE Negative   Urobilinogen, UA 0.2 0.0 - 1.0   Leukocytes,Ua TRACE (A) Negative   Nitrite NEGATIVE Negative   WBC, UA 11-20/hpf (A) 0-2/hpf   RBC / HPF 3-6/hpf (A) 0-2/hpf   Mucus, UA Presence of (A) None   Squamous Epithelial / HPF Many(>10/hpf) (A) Rare(0-4/hpf)   Bacteria, UA Many(>50/hpf) (A) None  Microalbumin / creatinine urine ratio     Status: Abnormal   Collection Time: 09/17/22  8:13 AM  Result Value Ref Range   Microalb, Ur 6.5 (H) 0.0 - 1.9 mg/dL   Creatinine,U 536.6 mg/dL   Microalb Creat Ratio 2.7 0.0 - 30.0 mg/g  Hemoglobin A1c     Status: None   Collection Time: 09/17/22  8:13 AM  Result Value Ref Range   Hgb A1c MFr Bld 5.3 4.6 - 6.5 %    Comment: Glycemic Control Guidelines for People with Diabetes:Non Diabetic:  <6%Goal of Therapy: <7%Additional Action Suggested:  >8%   Lipid panel     Status: None   Collection Time: 09/17/22  8:13 AM  Result Value Ref Range   Cholesterol 182 0 - 200 mg/dL    Comment: ATP III Classification       Desirable:  < 200 mg/dL               Borderline High:  200 - 239 mg/dL          High:  > = 440 mg/dL   Triglycerides 347.4 0.0 - 149.0 mg/dL    Comment: Normal:  <259 mg/dLBorderline High:  150 - 199 mg/dL   HDL 56.38 >75.64 mg/dL   VLDL 33.2 0.0 - 95.1 mg/dL   LDL Cholesterol 95 0 - 99 mg/dL   Total CHOL/HDL Ratio 3     Comment:                Men          Women1/2 Average Risk     3.4          3.3Average Risk          5.0          4.42X Average Risk          9.6          7.13X Average Risk          15.0          11.0                       NonHDL 115.80     Comment: NOTE:  Non-HDL goal should be 30 mg/dL higher than patient's LDL goal (i.e. LDL goal of < 70 mg/dL, would have non-HDL goal of < 100 mg/dL)  TSH     Status: None   Collection Time: 09/17/22  8:13 AM  Result Value Ref Range   TSH  3.34 0.35 - 5.50 uIU/mL  Ferritin     Status: None   Collection Time: 09/17/22  8:13 AM  Result Value Ref Range   Ferritin 101.0 10.0 - 291.0 ng/mL  Iron and TIBC     Status: None   Collection Time: 09/17/22  8:13 AM  Result Value Ref Range   Total Iron Binding Capacity 438 250 - 450 ug/dL   UIBC 884 166 - 063 ug/dL   Iron 016 27 - 010 ug/dL   Iron Saturation 32 15 - 55 %  B12 and Folate Panel  Status: None   Collection Time: 09/17/22  8:13 AM  Result Value Ref Range   Vitamin B-12 667 211 - 911 pg/mL   Folate 7.3 >5.9 ng/mL  C-reactive protein     Status: None   Collection Time: 09/17/22  8:13 AM  Result Value Ref Range   CRP <1.0 0.5 - 20.0 mg/dL  Sedimentation rate     Status: None   Collection Time: 09/17/22  8:13 AM  Result Value Ref Range   Sed Rate 4 0 - 20 mm/hr  CBC w/Diff     Status: Abnormal   Collection Time: 09/17/22  8:13 AM  Result Value Ref Range   WBC 5.8 4.0 - 10.5 K/uL   RBC 3.82 (L) 3.87 - 5.11 Mil/uL   Hemoglobin 12.4 12.0 - 15.0 g/dL   HCT 62.9 52.8 - 41.3 %   MCV 95.0 78.0 - 100.0 fl   MCHC 34.2 30.0 - 36.0 g/dL   RDW 24.4 01.0 - 27.2 %   Platelets 431.0 (H) 150.0 - 400.0 K/uL   Neutrophils Relative % 49.2 43.0 - 77.0 %   Lymphocytes Relative 43.0 12.0 - 46.0 %   Monocytes Relative 5.8 3.0 - 12.0 %   Eosinophils Relative 0.9 0.0 - 5.0 %   Basophils Relative 1.1 0.0 - 3.0 %   Neutro Abs 2.9 1.4 - 7.7 K/uL   Lymphs Abs 2.5 0.7 - 4.0 K/uL   Monocytes Absolute 0.3 0.1 - 1.0 K/uL   Eosinophils Absolute 0.1 0.0 - 0.7 K/uL   Basophils Absolute 0.1 0.0 - 0.1 K/uL  Protime-INR     Status: None   Collection Time: 09/19/22  8:04 AM  Result Value Ref Range   INR 0.9 0.8 - 1.0 ratio   Prothrombin Time 9.6 9.6 - 13.1 sec  APTT     Status: None   Collection Time: 09/19/22  8:04 AM  Result Value Ref Range   aPTT 27.3 25.4 - 36.8 SEC    At today's visit, we discussed treatment options, associated risk and benefits, and engage in counseling as needed.   Additionally the following were reviewed: Past medical records, past medical and surgical history, family and social background, as well as relevant laboratory results, imaging findings, and specialty notes, where applicable.  This message was generated using dictation software, and as a result, it may contain unintentional typos or errors.  Nevertheless, extensive effort was made to accurately convey at the pertinent aspects of the patient visit.    There may have been are other unrelated non-urgent complaints, but due to the busy schedule and the amount of time already spent with her, time does not permit to address these issues at today's visit. Another appointment may have or has been requested to review these additional issues.   Thomes Dinning, MD, MS

## 2022-09-19 NOTE — Assessment & Plan Note (Signed)
Slightly improved but still elevated platelet count (430). Has been referred to hematology

## 2022-09-19 NOTE — Patient Instructions (Signed)
Repeat fasting BMP lab work in two weeks to monitor renal function. Schedule an X-ray to evaluate ongoing neck pain. For xray, go to:    [place] [facility] [address] [place], [place], [place] [address] Phone: [phone]  Continue current medications for acne, anxiety, depression, ADHD, and intermittent HSV-2 infections as prescribed. Plan to receive pneumonia vaccination due to smoking history.

## 2022-09-20 LAB — URINALYSIS W MICROSCOPIC + REFLEX CULTURE
Ketones, ur: NEGATIVE
Specific Gravity, Urine: 1.019 (ref 1.001–1.035)
pH: 6 (ref 5.0–8.0)

## 2022-09-21 LAB — URINE CULTURE
MICRO NUMBER:: 15115937
SPECIMEN QUALITY:: ADEQUATE

## 2022-09-21 LAB — URINALYSIS W MICROSCOPIC + REFLEX CULTURE
Bilirubin Urine: NEGATIVE
Glucose, UA: NEGATIVE
Hyaline Cast: NONE SEEN /LPF
Leukocyte Esterase: NEGATIVE
Nitrites, Initial: NEGATIVE

## 2022-09-21 LAB — CULTURE INDICATED

## 2022-09-21 LAB — VITAMIN D 1,25 DIHYDROXY
Vitamin D 1, 25 (OH)2 Total: 83 pg/mL — ABNORMAL HIGH (ref 18–72)
Vitamin D2 1, 25 (OH)2: <8 pg/mL
Vitamin D3 1, 25 (OH)2: 83 pg/mL

## 2022-09-22 ENCOUNTER — Encounter: Payer: Self-pay | Admitting: Family Medicine

## 2022-10-17 ENCOUNTER — Other Ambulatory Visit: Payer: 59

## 2022-10-22 NOTE — Progress Notes (Signed)
[facility] Cancer Initial Visit:  Patient Care Team: Garnette Gunner, MD as PCP - General (Family Medicine)  CHIEF COMPLAINTS/PURPOSE OF CONSULTATION:   HISTORY OF PRESENTING ILLNESS: Katie Green 37 y.o. female is here because of thrombocytosis Medical history notable for HSV-2 infection, tobacco use, opioid use in remission, ADD, nephrolithiasis, anemia  September 17, 2022:  WBC 5.8 hemoglobin 12.4 MCV 95 platelet count 431; 49 segs 43 lymphs 6 monos 1 EO 1 basophil Ferritin 101 folate 7.3 CMP notable for glucose 125 creatinine 1.29  October 24 2022:  [facility]  States that in 2018 she was told that she was anemic and because of this she takes oral iron.   Patient is G0 P0.  Menopause not reached.  Was amenorheic in the past due to anorexia but periods have returned in the past 6 months due to eating better.  Menses now occur monthly and usually last 3 days but was heavier last month.  Bleeding is light.  Does not have bleeding between periods.    Has not received  received IV iron/ required PRBC's in the past.  Has tolerated oral iron owing to GI intolerance.   States that she had excessive bleeding prior to stent placement for management of kidney stones.  No hematochezia, melena, hemoptysis, hematuria.   No history of intra-articular or soft tissue bleeding.  No history of abnormal bleeding in family members.   No history of abdominal surgery including splenectomy.  Denies history of eating disorder.     Social:  Works in [employer].  Former smoker, quit October 2019.  EtOH occasionally- maybe a drink per month   Review of Systems  Constitutional:  Negative for appetite change, chills, fatigue, fever and unexpected weight change.  HENT:   Negative for mouth sores, sore throat, trouble swallowing and voice change.        Occasional mild epistaxis in AM when wakes up  Eyes:  Negative for eye problems and icterus.       Vision  changes:  None  Respiratory:  Negative for chest tightness, cough, hemoptysis and shortness of breath.        PND:  none Orthopnea:  none DOE:    Cardiovascular:  Negative for chest pain and leg swelling.       Occasional palpitations if anxious  Gastrointestinal:  Negative for abdominal pain, blood in stool, constipation, diarrhea, nausea and vomiting.  Endocrine: Negative for hot flashes.       Cold intolerance:  none Heat intolerance:  none  Genitourinary:  Negative for bladder incontinence, difficulty urinating, dysuria, frequency, hematuria and nocturia.   Musculoskeletal:  Negative for gait problem and myalgias.       Chronic pain in lumbar region, neck and shoulders and left foot  Skin:  Negative for itching, rash and wound.  Neurological:  Negative for dizziness, extremity weakness, gait problem, headaches, light-headedness, seizures and speech difficulty.       Hands sometimes goes numb if elevated hands  Hematological:  Negative for adenopathy. Bruises/bleeds easily.  Psychiatric/Behavioral:  Negative for sleep disturbance and suicidal ideas. The patient is not nervous/anxious.     MEDICAL HISTORY: Past Medical History:  Diagnosis Date   Anemia    Anxiety    Arthritis    Cervical dysplasia    LEEP for high grade 03/2010 margins clear, lgsil pap/C&B 09/2010, ASCUS neg HR HPV pap 04/2011   Complex regional pain syndrome    Left foot  Depression    HSV-2 (herpes simplex virus 2) infection    Hyperlipidemia 08/22/2022   Kidney stones    Nephrolithiasis 07/29/2016   Confirmed multiple stones via CT scan    SURGICAL HISTORY: Past Surgical History:  Procedure Laterality Date   CERVICAL BIOPSY  W/ LOOP ELECTRODE EXCISION  03/31/2010   high grade with free margins   GUM SURGERY      SOCIAL HISTORY: Social History   Socioeconomic History   Marital status: Single    Spouse name: Not on file   Number of children: Not on file   Years of education: Not on file    Highest education level: Not on file  Occupational History   Not on file  Tobacco Use   Smoking status: Former    Current packs/day: 0.00    Average packs/day: 1 pack/day for 17.0 years (17.0 ttl pk-yrs)    Types: Cigarettes, E-cigarettes    Start date: 01/29/2001    Quit date: 01/29/2018    Years since quitting: 4.8    Passive exposure: Never   Smokeless tobacco: Never   Tobacco comments:    Quit 2019  Vaping Use   Vaping status: Some Days  Substance and Sexual Activity   Alcohol use: Yes    Alcohol/week: 1.0 standard drink of alcohol    Types: 1 Standard drinks or equivalent per week    Comment: Just on occasion   Drug use: No   Sexual activity: Yes    Birth control/protection: Pill    Comment: 1st intercourse 37 yo-More than 5 partners  Other Topics Concern   Not on file  Social History Narrative   Not on file   Social Determinants of Health   Financial Resource Strain: Not on file  Food Insecurity: No Food Insecurity (11/14/2022)   Hunger Vital Sign    Worried About Running Out of Food in the Last Year: Never true    Ran Out of Food in the Last Year: Never true  Transportation Needs: No Transportation Needs (11/14/2022)   PRAPARE - Administrator, Civil Service (Medical): No    Lack of Transportation (Non-Medical): No  Physical Activity: Not on file  Stress: Not on file  Social Connections: Not on file  Intimate Partner Violence: Not At Risk (11/14/2022)   Humiliation, Afraid, Rape, and Kick questionnaire    Fear of Current or Ex-Partner: No    Emotionally Abused: No    Physically Abused: No    Sexually Abused: No    FAMILY HISTORY Family History  Problem Relation Age of Onset   Hypertension Mother    Kidney Stones Mother    Hyperlipidemia Mother    Diabetes Mother    Anxiety disorder Mother    Kidney disease Mother    Heart attack Father    Diabetes Father    Hearing loss Father    Heart disease Father    Hyperlipidemia Maternal Grandmother     Hypertension Maternal Grandmother    Fibromyalgia Maternal Grandmother    Dementia Maternal Grandmother    Arthritis Maternal Grandmother    Hypertension Maternal Grandfather    Hyperlipidemia Maternal Grandfather    Aneurysm Maternal Grandfather    Deep vein thrombosis Maternal Grandfather    Hearing loss Maternal Grandfather    Emphysema Paternal Grandmother        Smoker   Stroke Paternal Grandmother    Heart disease Paternal Grandfather    Hyperlipidemia Paternal Grandfather    CAD Paternal Grandfather  Emphysema Paternal Grandfather    COPD Paternal Grandfather        Quit smoking 30-years-ago   Aneurysm Paternal Grandfather    Arthritis Paternal Grandfather    Anxiety disorder Sister     ALLERGIES:  has No Known Allergies.  MEDICATIONS:  Current Outpatient Medications  Medication Sig Dispense Refill   amphetamine-dextroamphetamine (ADDERALL) 20 MG tablet TAKE 1 TABLET BY MOUTH EVERY MORNING, AT NOON, AND AT 4PM  0   diclofenac Sodium (VOLTAREN) 1 % GEL Apply 4 g topically 4 (four) times daily as needed. 100 g 3   ferrous sulfate 325 (65 FE) MG EC tablet Take 325 mg by mouth daily with supper.     LORazepam (ATIVAN) 1 MG tablet Take 1 mg by mouth every 8 (eight) hours.     norethindrone-ethinyl estradiol-FE (JUNEL FE 1/20) 1-20 MG-MCG tablet Take 1 tablet by mouth daily. 84 tablet 3   spironolactone (ALDACTONE) 50 MG tablet Take 50 mg by mouth daily.     valACYclovir (VALTREX) 500 MG tablet Take 1 tablet (500 mg total) by mouth 2 (two) times daily. with outbreak. 30 tablet 3   VIIBRYD 40 MG TABS      vitamin B-12 (CYANOCOBALAMIN) 1000 MCG tablet Take 1,000 mcg by mouth daily.      No current facility-administered medications for this visit.    PHYSICAL EXAMINATION:  ECOG PERFORMANCE STATUS: 0 - Asymptomatic   Vitals:   10/24/22 1459  BP: (!) 131/91  Pulse: (!) 103  Resp: 17  Temp: 98.3 F (36.8 C)  SpO2: 100%    Filed Weights   10/24/22 1459   Weight: 93 lb 11.2 oz (42.5 kg)     Physical Exam Vitals and nursing note reviewed.  Constitutional:      General: She is not in acute distress.    Appearance: Normal appearance. She is not ill-appearing, toxic-appearing or diaphoretic.     Comments: Here with mother.  Thin  HENT:     Head: Normocephalic and atraumatic.     Right Ear: External ear normal.     Left Ear: External ear normal.     Nose: Nose normal. No congestion or rhinorrhea.  Eyes:     General: No scleral icterus.    Extraocular Movements: Extraocular movements intact.     Conjunctiva/sclera: Conjunctivae normal.     Pupils: Pupils are equal, round, and reactive to light.  Cardiovascular:     Rate and Rhythm: Normal rate and regular rhythm.     Heart sounds: No murmur heard.    No friction rub. No gallop.  Pulmonary:     Effort: Pulmonary effort is normal. No respiratory distress.     Breath sounds: Normal breath sounds. No stridor. No wheezing, rhonchi or rales.  Abdominal:     General: Abdomen is flat. Bowel sounds are normal.     Palpations: Abdomen is soft.     Tenderness: There is no abdominal tenderness. There is no guarding or rebound.  Musculoskeletal:        General: No swelling, tenderness or deformity.     Cervical back: Normal range of motion and neck supple. No rigidity or tenderness.     Right lower leg: No edema.     Left lower leg: No edema.  Lymphadenopathy:     Head:     Right side of head: No submental, submandibular, tonsillar, preauricular, posterior auricular or occipital adenopathy.     Left side of head: No submental, submandibular, tonsillar, preauricular, posterior  auricular or occipital adenopathy.     Cervical: No cervical adenopathy.     Right cervical: No superficial, deep or posterior cervical adenopathy.    Left cervical: No superficial, deep or posterior cervical adenopathy.     Upper Body:     Right upper body: No supraclavicular, axillary, pectoral or epitrochlear  adenopathy.     Left upper body: No supraclavicular, axillary, pectoral or epitrochlear adenopathy.  Skin:    General: Skin is warm.     Coloration: Skin is not jaundiced or pale.     Findings: No bruising, erythema or lesion.  Neurological:     General: No focal deficit present.     Mental Status: She is alert and oriented to person, place, and time.     Cranial Nerves: No cranial nerve deficit.     Motor: No weakness.  Psychiatric:        Mood and Affect: Mood normal.        Behavior: Behavior normal.        Thought Content: Thought content normal.        Judgment: Judgment normal.      LABORATORY DATA: I have personally reviewed the data as listed:  Appointment on 10/24/2022  Component Date Value Ref Range Status   Cryoglobulin 10/24/2022 Comment  None detected Final   Comment: (NOTE) None Detected at 72 hours This test was developed and its performance characteristics determined by [facility]. It has not been cleared or approved by the Food and Drug Administration. Performed At: [facility] [address] [place], [place] [id number] Jolene Schimke MD [id number]    Haptoglobin 10/24/2022 132  33 - 278 mg/dL Final   Comment: (NOTE) Performed At: [facility] [address] [place], [place] [id number] Jolene Schimke MD [id number]    Sed Rate 10/24/2022 2  0 - 22 mm/hr Final   Performed at [facility], [address], [place], [place] [address]   CRP 10/24/2022 0.6  <1.0 mg/dL Final   Performed at [facility], [address], [place], [place] [address]   WBC MORPHOLOGY 10/24/2022 MORPHOLOGY UNREMARKABLE   Final   RBC MORPHOLOGY 10/24/2022 MORPHOLOGY UNREMARKABLE   Final   Plt Morphology 10/24/2022 Large platelets   Final   Clinical Information 10/24/2022 thrombocytosis   Final   Performed at [facility], [address], [place], [place] [address]   JAK 2, MPL, CARL, [facility] 10/24/2022 SEE SEPARATE  REPORT   Final   Performed at [facility], [address], [place], [place] [address]   BCR ABL1 / ABL1 10/24/2022 SEE SEPARATE REPORT   Final   Performed at [facility], [address], [place], [place] [address]   WBC Count 10/24/2022 6.4  4.0 - 10.5 K/uL Final   RBC 10/24/2022 3.95  3.87 - 5.11 MIL/uL Final   Hemoglobin 10/24/2022 12.7  12.0 - 15.0 g/dL Final   HCT 33/29/5188 36.9  36.0 - 46.0 % Final   MCV 10/24/2022 93.4  80.0 - 100.0 fL Final   MCH 10/24/2022 32.2  26.0 - 34.0 pg Final   MCHC 10/24/2022 34.4  30.0 - 36.0 g/dL Final   RDW 41/66/0630 12.3  11.5 - 15.5 % Final   Platelet Count 10/24/2022 440 (H)  150 - 400 K/uL Final   nRBC 10/24/2022 0.0  0.0 - 0.2 % Final   Neutrophils Relative % 10/24/2022 51  % Final   Neutro Abs  10/24/2022 3.3  1.7 - 7.7 K/uL Final   Lymphocytes Relative 10/24/2022 41  % Final   Lymphs Abs 10/24/2022 2.6  0.7 - 4.0 K/uL Final   Monocytes Relative 10/24/2022 6  % Final   Monocytes Absolute 10/24/2022 0.4  0.1 - 1.0 K/uL Final   Eosinophils Relative 10/24/2022 1  % Final   Eosinophils Absolute 10/24/2022 0.1  0.0 - 0.5 K/uL Final   Basophils Relative 10/24/2022 1  % Final   Basophils Absolute 10/24/2022 0.0  0.0 - 0.1 K/uL Final   Immature Granulocytes 10/24/2022 0  % Final   Abs Immature Granulocytes 10/24/2022 0.01  0.00 - 0.07 K/uL Final   Performed at [facility], [address] [place], [place] [address]    RADIOGRAPHIC STUDIES: I have personally reviewed the radiological images as listed and agree with the findings in the report  No results found.  ASSESSMENT/PLAN   Thrombocytosis Platelet count of ?450  109/L is a generally accepted value. 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    No problem-specific Assessment & Plan notes found for this encounter.    Orders Placed This Encounter  Procedures   CBC with Differential (Cancer Center Only)    Standing Status:   Future    Number of Occurrences:   1    Standing Expiration Date:   10/24/2023   BCR ABL1 FISH ([facility])    Standing Status:   Future    Number of Occurrences:   1    Standing Expiration Date:   10/24/2023   JAK2 (INCLUDING V617F AND EXON 12), MPL,& CALR W/RFL MPN PANEL ([facility])    Standing Status:   Future    Number of Occurrences:   1    Standing Expiration Date:   10/24/2023   Technologist smear review    Standing Status:   Future    Number of Occurrences:   1    Standing Expiration Date:   10/24/2023    Order Specific Question:   Clinical information:    Answer:   thrombocytosis   C-reactive protein    Standing Status:   Future    Number of Occurrences:   1    Standing Expiration Date:   10/24/2023   Sedimentation rate    Standing Status:   Future    Number of Occurrences:   1    Standing Expiration Date:   10/24/2023   Haptoglobin    Standing Status:   Future    Number of  Occurrences:   1    Standing Expiration Date:   10/24/2023   Cryoglobulin    Standing Status:   Future  Number of Occurrences:   1    Standing Expiration Date:   10/24/2023    45  minutes was spent in patient care.  This included time spent preparing to see the patient (e.g., review of tests), obtaining and/or reviewing separately obtained history, counseling and educating the patient/family/caregiver, ordering medications, tests, or procedures; documenting clinical information in the electronic or other health record, independently interpreting results and communicating results to the patient/family/caregiver as well as coordination of care.       All questions were answered. The patient knows to call the clinic with any problems, questions or concerns.  This note was electronically signed.    Loni Muse, MD  11/19/2022 5:36 PM

## 2022-10-24 ENCOUNTER — Telehealth: Payer: Self-pay | Admitting: Oncology

## 2022-10-24 ENCOUNTER — Other Ambulatory Visit: Payer: Self-pay

## 2022-10-24 ENCOUNTER — Inpatient Hospital Stay: Payer: 59 | Attending: Oncology | Admitting: Oncology

## 2022-10-24 ENCOUNTER — Inpatient Hospital Stay: Payer: 59

## 2022-10-24 VITALS — BP 131/91 | HR 103 | Temp 98.3°F | Resp 17 | Ht 63.5 in | Wt 93.7 lb

## 2022-10-24 DIAGNOSIS — Z87891 Personal history of nicotine dependence: Secondary | ICD-10-CM | POA: Insufficient documentation

## 2022-10-24 DIAGNOSIS — D75839 Thrombocytosis, unspecified: Secondary | ICD-10-CM

## 2022-10-24 LAB — CBC WITH DIFFERENTIAL (CANCER CENTER ONLY)
Abs Immature Granulocytes: 0.01 10*3/uL (ref 0.00–0.07)
Basophils Absolute: 0 10*3/uL (ref 0.0–0.1)
Basophils Relative: 1 %
Eosinophils Absolute: 0.1 10*3/uL (ref 0.0–0.5)
Eosinophils Relative: 1 %
HCT: 36.9 % (ref 36.0–46.0)
Hemoglobin: 12.7 g/dL (ref 12.0–15.0)
Immature Granulocytes: 0 %
Lymphocytes Relative: 41 %
Lymphs Abs: 2.6 10*3/uL (ref 0.7–4.0)
MCH: 32.2 pg (ref 26.0–34.0)
MCHC: 34.4 g/dL (ref 30.0–36.0)
MCV: 93.4 fL (ref 80.0–100.0)
Monocytes Absolute: 0.4 10*3/uL (ref 0.1–1.0)
Monocytes Relative: 6 %
Neutro Abs: 3.3 10*3/uL (ref 1.7–7.7)
Neutrophils Relative %: 51 %
Platelet Count: 440 10*3/uL — ABNORMAL HIGH (ref 150–400)
RBC: 3.95 MIL/uL (ref 3.87–5.11)
RDW: 12.3 % (ref 11.5–15.5)
WBC Count: 6.4 10*3/uL (ref 4.0–10.5)
nRBC: 0 % (ref 0.0–0.2)

## 2022-10-24 LAB — C-REACTIVE PROTEIN: CRP: 0.6 mg/dL (ref <1.0)

## 2022-10-24 LAB — SEDIMENTATION RATE: Sed Rate: 2 mm/hr (ref 0–22)

## 2022-10-24 LAB — TECHNOLOGIST SMEAR REVIEW

## 2022-10-24 NOTE — Telephone Encounter (Signed)
Patient is aware of rescheduled appointment times/dates 

## 2022-10-31 ENCOUNTER — Other Ambulatory Visit (INDEPENDENT_AMBULATORY_CARE_PROVIDER_SITE_OTHER): Payer: 59

## 2022-10-31 DIAGNOSIS — R7989 Other specified abnormal findings of blood chemistry: Secondary | ICD-10-CM | POA: Diagnosis not present

## 2022-10-31 LAB — BASIC METABOLIC PANEL
BUN: 17 mg/dL (ref 6–23)
CO2: 25 mEq/L (ref 19–32)
Calcium: 9.6 mg/dL (ref 8.4–10.5)
Chloride: 102 mEq/L (ref 96–112)
Creatinine, Ser: 1.27 mg/dL — ABNORMAL HIGH (ref 0.40–1.20)
GFR: 53.26 mL/min — ABNORMAL LOW (ref >60.00)
Glucose, Bld: 101 mg/dL — ABNORMAL HIGH (ref 70–99)
Potassium: 3.9 mEq/L (ref 3.5–5.1)
Sodium: 137 mEq/L (ref 135–145)

## 2022-11-13 ENCOUNTER — Other Ambulatory Visit: Payer: Self-pay | Admitting: Oncology

## 2022-11-13 DIAGNOSIS — D75839 Thrombocytosis, unspecified: Secondary | ICD-10-CM

## 2022-11-13 NOTE — Progress Notes (Signed)
[facility] Cancer Initial Visit:  Patient Care Team: Garnette Gunner, MD as PCP - General (Family Medicine)  CHIEF COMPLAINTS/PURPOSE OF CONSULTATION:   HISTORY OF PRESENTING ILLNESS: Katie Green 37 y.o. female is here because of thrombocytosis Medical history notable for HSV-2 infection, tobacco use, opioid use in remission, ADD, nephrolithiasis, anemia  September 17, 2022:  WBC 5.8 hemoglobin 12.4 MCV 95 platelet count 431; 49 segs 43 lymphs 6 monos 1 EO 1 basophil Ferritin 101 folate 7.3 CMP notable for glucose 125 creatinine 1.29  October 24 2022:  [facility]  States that in 2018 she was told that she was anemic and because of this she takes oral iron.   Patient is G0 P0.  Menopause not reached.  Was amenorheic in the past due to anorexia but periods have returned in the past 6 months due to eating better.  Menses now occur monthly and usually last 3 days but was heavier last month.  Bleeding is light.  Does not have bleeding between periods.    Has not received  received IV iron/ required PRBC's in the past.  Has tolerated oral iron owing to GI intolerance.   States that she had excessive bleeding prior to stent placement for management of kidney stones.  No hematochezia, melena, hemoptysis, hematuria.   No history of intra-articular or soft tissue bleeding.  No history of abnormal bleeding in family members.   No history of abdominal surgery including splenectomy.  Denies history of eating disorder.     Social:  Works in [employer].  Former smoker, quit October 2019.  EtOH occasionally- maybe a drink per month   WBC 6.4 hemoglobin 12.7 platelet count 440; 51 segs 41 lymphs 6 monos 1 EO 1 basophil.  Morphology demonstrated large platelets and thrombocytosis Sed rate 2.  Cryoglobulin negative  FISH for BCR able negative  NGS panel for JAK2, MPL and CALR demonstrated a tier 2 variant of potential clinical significance in the form  of CDKN2A p.ALA4-PRO11dup This represents a duplication of 24 nucleotide's resulting in insertion of 8 amino acid residues in exon 1 of CDKN2A.  This variant occurs within the) repeat (18 through 42) region and X on 1 of CDKN2A.  It has been reported as somatic and pancreatic carcinoma and has been reported as a population variant and publicly available databases.  It is also been reported in multiple individuals hereditary cancer predisposing syndrome  November 14 2022:  Scheduled follow up for thrombocytosis.  Reviewed results of labs with patient and mother.  Arrange for bone marrow bx/aspirate with molecular studies Refer to geneticist.  May have to think about non-estrogen containing OCP because of myeloproliferative disorder  Review of Systems  Constitutional:  Negative for appetite change, chills, fatigue, fever and unexpected weight change.  HENT:   Negative for mouth sores, sore throat, trouble swallowing and voice change.        Occasional mild epistaxis in AM when wakes up  Eyes:  Negative for eye problems and icterus.       Vision changes:  None  Respiratory:  Negative for chest tightness, cough, hemoptysis and shortness of breath.        PND:  none Orthopnea:  none DOE:    Cardiovascular:  Negative for chest pain and leg swelling.       Occasional palpitations if anxious  Gastrointestinal:  Negative for abdominal pain, blood in stool, constipation, diarrhea, nausea and vomiting.  Endocrine: Negative for  hot flashes.       Cold intolerance:  none Heat intolerance:  none  Genitourinary:  Negative for bladder incontinence, difficulty urinating, dysuria, frequency, hematuria and nocturia.   Musculoskeletal:  Negative for gait problem and myalgias.       Chronic pain in lumbar region, neck and shoulders and left foot  Skin:  Negative for itching, rash and wound.  Neurological:  Negative for dizziness, extremity weakness, gait problem, headaches, light-headedness, seizures and speech  difficulty.       Hands sometimes goes numb if elevated hands  Hematological:  Negative for adenopathy. Bruises/bleeds easily.  Psychiatric/Behavioral:  Negative for sleep disturbance and suicidal ideas. The patient is not nervous/anxious.     MEDICAL HISTORY: Past Medical History:  Diagnosis Date   Anemia    Anxiety    Arthritis    Cervical dysplasia    LEEP for high grade 03/2010 margins clear, lgsil pap/C&B 09/2010, ASCUS neg HR HPV pap 04/2011   Complex regional pain syndrome    Left foot   Depression    HSV-2 (herpes simplex virus 2) infection    Hyperlipidemia 08/22/2022   Kidney stones    Nephrolithiasis 07/29/2016   Confirmed multiple stones via CT scan    SURGICAL HISTORY: Past Surgical History:  Procedure Laterality Date   CERVICAL BIOPSY  W/ LOOP ELECTRODE EXCISION  03/31/2010   high grade with free margins   GUM SURGERY      SOCIAL HISTORY: Social History   Socioeconomic History   Marital status: Single    Spouse name: Not on file   Number of children: Not on file   Years of education: Not on file   Highest education level: Not on file  Occupational History   Not on file  Tobacco Use   Smoking status: Former    Current packs/day: 0.00    Average packs/day: 1 pack/day for 17.0 years (17.0 ttl pk-yrs)    Types: Cigarettes, E-cigarettes    Start date: 01/29/2001    Quit date: 01/29/2018    Years since quitting: 4.8    Passive exposure: Never   Smokeless tobacco: Never   Tobacco comments:    Quit 2019  Vaping Use   Vaping status: Some Days  Substance and Sexual Activity   Alcohol use: Yes    Alcohol/week: 1.0 standard drink of alcohol    Types: 1 Standard drinks or equivalent per week    Comment: Just on occasion   Drug use: No   Sexual activity: Yes    Birth control/protection: Pill    Comment: 1st intercourse 37 yo-More than 5 partners  Other Topics Concern   Not on file  Social History Narrative   Not on file   Social Determinants of  Health   Financial Resource Strain: Not on file  Food Insecurity: No Food Insecurity (11/14/2022)   Hunger Vital Sign    Worried About Running Out of Food in the Last Year: Never true    Ran Out of Food in the Last Year: Never true  Transportation Needs: No Transportation Needs (11/14/2022)   PRAPARE - Administrator, Civil Service (Medical): No    Lack of Transportation (Non-Medical): No  Physical Activity: Not on file  Stress: Not on file  Social Connections: Not on file  Intimate Partner Violence: Not At Risk (11/14/2022)   Humiliation, Afraid, Rape, and Kick questionnaire    Fear of Current or Ex-Partner: No    Emotionally Abused: No  Physically Abused: No    Sexually Abused: No    FAMILY HISTORY Family History  Problem Relation Age of Onset   Hypertension Mother    Kidney Stones Mother    Hyperlipidemia Mother    Diabetes Mother    Anxiety disorder Mother    Kidney disease Mother    Heart attack Father    Diabetes Father    Hearing loss Father    Heart disease Father    Hyperlipidemia Maternal Grandmother    Hypertension Maternal Grandmother    Fibromyalgia Maternal Grandmother    Dementia Maternal Grandmother    Arthritis Maternal Grandmother    Hypertension Maternal Grandfather    Hyperlipidemia Maternal Grandfather    Aneurysm Maternal Grandfather    Deep vein thrombosis Maternal Grandfather    Hearing loss Maternal Grandfather    Emphysema Paternal Grandmother        Smoker   Stroke Paternal Grandmother    Heart disease Paternal Grandfather    Hyperlipidemia Paternal Grandfather    CAD Paternal Grandfather    Emphysema Paternal Grandfather    COPD Paternal Grandfather        Quit smoking 30-years-ago   Aneurysm Paternal Grandfather    Arthritis Paternal Grandfather    Anxiety disorder Sister     ALLERGIES:  has No Known Allergies.  MEDICATIONS:  Current Outpatient Medications  Medication Sig Dispense Refill    amphetamine-dextroamphetamine (ADDERALL) 20 MG tablet TAKE 1 TABLET BY MOUTH EVERY MORNING, AT NOON, AND AT 4PM  0   diclofenac Sodium (VOLTAREN) 1 % GEL Apply 4 g topically 4 (four) times daily as needed. 100 g 3   ferrous sulfate 325 (65 FE) MG EC tablet Take 325 mg by mouth daily with supper.     LORazepam (ATIVAN) 1 MG tablet Take 1 mg by mouth every 8 (eight) hours.     norethindrone-ethinyl estradiol-FE (JUNEL FE 1/20) 1-20 MG-MCG tablet Take 1 tablet by mouth daily. 84 tablet 3   spironolactone (ALDACTONE) 50 MG tablet Take 50 mg by mouth daily.     valACYclovir (VALTREX) 500 MG tablet Take 1 tablet (500 mg total) by mouth 2 (two) times daily. with outbreak. 30 tablet 3   VIIBRYD 40 MG TABS      vitamin B-12 (CYANOCOBALAMIN) 1000 MCG tablet Take 1,000 mcg by mouth daily.      No current facility-administered medications for this visit.    PHYSICAL EXAMINATION:  ECOG PERFORMANCE STATUS: 0 - Asymptomatic   Vitals:   11/14/22 1401 11/14/22 1422  BP: 110/68 (!) 127/93  Pulse:  (!) 104  Resp:  17  Temp:  98.4 F (36.9 C)  SpO2:  100%    Filed Weights   11/14/22 1422  Weight: 93 lb 14.4 oz (42.6 kg)     Physical Exam Vitals and nursing note reviewed.  Constitutional:      General: She is not in acute distress.    Appearance: Normal appearance. She is not ill-appearing, toxic-appearing or diaphoretic.     Comments: Here with mother.  Thin  HENT:     Head: Normocephalic and atraumatic.     Right Ear: External ear normal.     Left Ear: External ear normal.     Nose: Nose normal. No congestion or rhinorrhea.  Eyes:     General: No scleral icterus.    Extraocular Movements: Extraocular movements intact.     Conjunctiva/sclera: Conjunctivae normal.     Pupils: Pupils are equal, round, and reactive to light.  Cardiovascular:     Rate and Rhythm: Normal rate and regular rhythm.     Heart sounds: No murmur heard.    No friction rub. No gallop.  Pulmonary:     Effort:  Pulmonary effort is normal. No respiratory distress.     Breath sounds: Normal breath sounds. No stridor. No wheezing, rhonchi or rales.  Abdominal:     General: Abdomen is flat. Bowel sounds are normal.     Palpations: Abdomen is soft.     Tenderness: There is no abdominal tenderness. There is no guarding or rebound.  Musculoskeletal:        General: No swelling, tenderness or deformity.     Cervical back: Normal range of motion and neck supple. No rigidity or tenderness.     Right lower leg: No edema.     Left lower leg: No edema.  Lymphadenopathy:     Head:     Right side of head: No submental, submandibular, tonsillar, preauricular, posterior auricular or occipital adenopathy.     Left side of head: No submental, submandibular, tonsillar, preauricular, posterior auricular or occipital adenopathy.     Cervical: No cervical adenopathy.     Right cervical: No superficial, deep or posterior cervical adenopathy.    Left cervical: No superficial, deep or posterior cervical adenopathy.     Upper Body:     Right upper body: No supraclavicular, axillary, pectoral or epitrochlear adenopathy.     Left upper body: No supraclavicular, axillary, pectoral or epitrochlear adenopathy.  Skin:    General: Skin is warm.     Coloration: Skin is not jaundiced or pale.     Findings: No bruising, erythema or lesion.  Neurological:     General: No focal deficit present.     Mental Status: She is alert and oriented to person, place, and time.     Cranial Nerves: No cranial nerve deficit.     Motor: No weakness.  Psychiatric:        Mood and Affect: Mood normal.        Behavior: Behavior normal.        Thought Content: Thought content normal.        Judgment: Judgment normal.      LABORATORY DATA: I have personally reviewed the data as listed:  Lab on 10/31/2022  Component Date Value Ref Range Status   Sodium 10/31/2022 137  135 - 145 mEq/L Final   Potassium 10/31/2022 3.9  3.5 - 5.1 mEq/L  Final   Chloride 10/31/2022 102  96 - 112 mEq/L Final   CO2 10/31/2022 25  19 - 32 mEq/L Final   Glucose, Bld 10/31/2022 101 (H)  70 - 99 mg/dL Final   BUN 16/12/9602 17  6 - 23 mg/dL Final   Creatinine, Ser 10/31/2022 1.27 (H)  0.40 - 1.20 mg/dL Final   GFR 54/11/8117 53.26 (L)  >60.00 mL/min Final   Calculated using the CKD-EPI Creatinine Equation (2021)   Calcium 10/31/2022 9.6  8.4 - 10.5 mg/dL Final  Appointment on 14/78/2956  Component Date Value Ref Range Status   Cryoglobulin 10/24/2022 Comment  None detected Final   Comment: (NOTE) None Detected at 72 hours This test was developed and its performance characteristics determined by [facility]. It has not been cleared or approved by the Food and Drug Administration. Performed At: [facility] [address] [place], [place] [id number] Jolene Schimke MD IO:[id number]    Haptoglobin 10/24/2022 132  33 - 278 mg/dL Final   Comment: (  NOTE) Performed At: [facility] [address] [place], [place] [id number] Jolene Schimke MD WU:[id number]    Sed Rate 10/24/2022 2  0 - 22 mm/hr Final   Performed at [facility], [address], [place], [place] [address]   CRP 10/24/2022 0.6  <1.0 mg/dL Final   Performed at [facility], [address], [place], [place] [address]   WBC MORPHOLOGY 10/24/2022 MORPHOLOGY UNREMARKABLE   Final   RBC MORPHOLOGY 10/24/2022 MORPHOLOGY UNREMARKABLE   Final   Plt Morphology 10/24/2022 Large platelets   Final   Clinical Information 10/24/2022 thrombocytosis   Final   Performed at [facility], [address] [place], [place] [address]   JAK 2, MPL, CARL, [facility] 10/24/2022 SEE SEPARATE REPORT   Final   Performed at [facility], [address], [place], [place] [address]   BCR ABL1 / ABL1 10/24/2022 SEE SEPARATE REPORT   Final   Performed at [facility], [address], [place], [place]  [address]   WBC Count 10/24/2022 6.4  4.0 - 10.5 K/uL Final   RBC 10/24/2022 3.95  3.87 - 5.11 MIL/uL Final   Hemoglobin 10/24/2022 12.7  12.0 - 15.0 g/dL Final   HCT 44/03/270 36.9  36.0 - 46.0 % Final   MCV 10/24/2022 93.4  80.0 - 100.0 fL Final   MCH 10/24/2022 32.2  26.0 - 34.0 pg Final   MCHC 10/24/2022 34.4  30.0 - 36.0 g/dL Final   RDW 53/66/4403 12.3  11.5 - 15.5 % Final   Platelet Count 10/24/2022 440 (H)  150 - 400 K/uL Final   nRBC 10/24/2022 0.0  0.0 - 0.2 % Final   Neutrophils Relative % 10/24/2022 51  % Final   Neutro Abs 10/24/2022 3.3  1.7 - 7.7 K/uL Final   Lymphocytes Relative 10/24/2022 41  % Final   Lymphs Abs 10/24/2022 2.6  0.7 - 4.0 K/uL Final   Monocytes Relative 10/24/2022 6  % Final   Monocytes Absolute 10/24/2022 0.4  0.1 - 1.0 K/uL Final   Eosinophils Relative 10/24/2022 1  % Final   Eosinophils Absolute 10/24/2022 0.1  0.0 - 0.5 K/uL Final   Basophils Relative 10/24/2022 1  % Final   Basophils Absolute 10/24/2022 0.0  0.0 - 0.1 K/uL Final   Immature Granulocytes 10/24/2022 0  % Final   Abs Immature Granulocytes 10/24/2022 0.01  0.00 - 0.07 K/uL Final   Performed at [facility], [address], [place], [place] [address]    RADIOGRAPHIC STUDIES: I have personally reviewed the radiological images as listed and agree with the findings in the report  No results found.  ASSESSMENT/PLAN   Thrombocytosis November 14 2022- Most likely etiology for this patient is a myeloproliferative neoplasm.  NGS for myeloproliferative disorder demonstrated a tier 2 variant of potential clinical significance in the form of CDKN2A p.ALA4-PRO11dup  Referring patient for bone marrow bx and aspirate with molecular studies to evaluate.  Holding on cytoreductive therapy at this time due to low risk for thrombotic events. Recommended that consideration be given to use of non-estrogen containing OCP because of myeloproliferative disorder    CDKN2A  p.ALA4-PRO11dup-- This represents a duplication of 24 nucleotide's resulting in insertion of 8 amino acid residues in exon 1 of CDKN2A.  This variant occurs within the) repeat (18 through 42) region and X on 1 of CDKN2A.  It has been reported as somatic and pancreatic  carcinoma and has been reported as a population variant and publicly available databases.  It is also been reported in multiple individuals hereditary cancer predisposing syndrome  November 14 2022- Referring tto genetics for discussion of further management   Cancer Staging  No matching staging information was found for the patient.    No problem-specific Assessment & Plan notes found for this encounter.    Orders Placed This Encounter  Procedures   CT BONE MARROW ASPIRATION    Standing Status:   Future    Standing Expiration Date:   11/14/2023    Order Specific Question:   Reason for Exam (SYMPTOM  OR DIAGNOSIS REQUIRED)    Answer:   Thrombocytosis    Order Specific Question:   Is patient pregnant?    Answer:   Yes    Order Specific Question:   Preferred imaging location?    Answer:   [facility]   Ambulatory referral to Genetics    Referral Priority:   Routine    Referral Type:   Consultation    Referral Reason:   Specialty Services Required    Number of Visits Requested:   1    30  minutes was spent in patient care.  This included time spent preparing to see the patient (e.g., review of tests), obtaining and/or reviewing separately obtained history, counseling and educating the patient/family/caregiver, ordering medications, tests, or procedures; documenting clinical information in the electronic or other health record, independently interpreting results and communicating results to the patient/family/caregiver as well as coordination of care.       All questions were answered. The patient knows to call the clinic with any problems, questions or concerns.  This note was electronically signed.    Loni Muse, MD  12/10/2022 12:47 PM

## 2022-11-14 ENCOUNTER — Other Ambulatory Visit: Payer: Self-pay

## 2022-11-14 ENCOUNTER — Encounter: Payer: Self-pay | Admitting: Oncology

## 2022-11-14 ENCOUNTER — Inpatient Hospital Stay: Payer: 59 | Attending: Oncology | Admitting: Oncology

## 2022-11-14 VITALS — BP 127/93 | HR 104 | Temp 98.4°F | Resp 17 | Wt 93.9 lb

## 2022-11-14 DIAGNOSIS — D471 Chronic myeloproliferative disease: Secondary | ICD-10-CM

## 2022-11-14 DIAGNOSIS — Z1501 Genetic susceptibility to malignant neoplasm of breast: Secondary | ICD-10-CM

## 2022-11-14 DIAGNOSIS — D75839 Thrombocytosis, unspecified: Secondary | ICD-10-CM | POA: Insufficient documentation

## 2022-11-14 DIAGNOSIS — Z1509 Genetic susceptibility to other malignant neoplasm: Secondary | ICD-10-CM

## 2022-11-20 ENCOUNTER — Telehealth: Payer: Self-pay | Admitting: Genetic Counselor

## 2022-12-10 DIAGNOSIS — D471 Chronic myeloproliferative disease: Secondary | ICD-10-CM | POA: Insufficient documentation

## 2022-12-10 DIAGNOSIS — Z1501 Genetic susceptibility to malignant neoplasm of breast: Secondary | ICD-10-CM | POA: Insufficient documentation

## 2022-12-11 ENCOUNTER — Other Ambulatory Visit: Payer: Self-pay | Admitting: Radiology

## 2022-12-11 ENCOUNTER — Ambulatory Visit (HOSPITAL_COMMUNITY): Payer: 59

## 2022-12-11 DIAGNOSIS — D75839 Thrombocytosis, unspecified: Secondary | ICD-10-CM

## 2022-12-11 NOTE — Consult Note (Signed)
Chief Complaint: Patient was seen in consultation today for CT guided bone marrow biopsy  Referring Physician(s): Ribakove,Everett C  Supervising Physician: Marliss Coots  Patient Status: [facility] - Out-pt  History of Present Illness: Katie Green is a 37 y.o. 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 She is scheduled today for CT guided bone marrow biopsy for further evaluation/ rule out MPN.   Past Medical History:  Diagnosis Date   Anemia    Anxiety    Arthritis    Cervical dysplasia    LEEP for high grade 03/2010 margins clear, lgsil pap/C&B 09/2010, ASCUS neg HR HPV pap 04/2011   Complex regional pain syndrome    Left foot   Depression    HSV-2 (herpes simplex virus 2) infection    Hyperlipidemia 08/22/2022   Kidney stones    Nephrolithiasis 07/29/2016   Confirmed multiple stones via CT scan    Past Surgical History:  Procedure Laterality Date   CERVICAL BIOPSY  W/ LOOP ELECTRODE EXCISION  03/31/2010   high grade with free margins   GUM SURGERY      Allergies: Patient has no known allergies.  Medications: Prior to Admission medications   Medication Sig Start Date End Date Taking? Authorizing Provider  amphetamine-dextroamphetamine (ADDERALL) 20 MG tablet TAKE 1 TABLET BY MOUTH EVERY MORNING, AT NOON, AND AT 4PM 10/20/16   [provider]  diclofenac Sodium (VOLTAREN) 1 % GEL Apply 4 g topically 4 (four) times daily as needed. 09/19/22   Garnette Gunner, MD  ferrous sulfate 325 (65 FE) MG EC tablet Take 325 mg by mouth daily with supper.    [provider]  LORazepam (ATIVAN) 1 MG tablet Take 1 mg by mouth every 8 (eight) hours.    [provider]  norethindrone-ethinyl estradiol-FE (JUNEL FE 1/20) 1-20 MG-MCG tablet Take 1 tablet by mouth daily. 09/18/22   Olivia Mackie, NP   spironolactone (ALDACTONE) 50 MG tablet Take 50 mg by mouth daily.    [provider]  valACYclovir (VALTREX) 500 MG tablet Take 1 tablet (500 mg total) by mouth 2 (two) times daily. with outbreak. 11/05/20   Olivia Mackie, NP  VIIBRYD 40 MG TABS  09/28/21   [provider]  vitamin B-12 (CYANOCOBALAMIN) 1000 MCG tablet Take 1,000 mcg by mouth daily.     [provider]     Family History  Problem Relation Age of Onset   Hypertension Mother    Kidney Stones Mother    Hyperlipidemia Mother    Diabetes Mother    Anxiety disorder Mother    Kidney disease Mother    Heart attack Father    Diabetes Father    Hearing loss Father    Heart disease Father    Hyperlipidemia Maternal Grandmother    Hypertension Maternal Grandmother    Fibromyalgia Maternal Grandmother    Dementia Maternal Grandmother    Arthritis Maternal Grandmother    Hypertension Maternal Grandfather    Hyperlipidemia Maternal Grandfather    Aneurysm Maternal Grandfather    Deep vein thrombosis Maternal Grandfather    Hearing loss Maternal Grandfather    Emphysema Paternal Grandmother        Smoker   Stroke Paternal Grandmother    Heart disease Paternal Grandfather    Hyperlipidemia Paternal Grandfather    CAD Paternal Grandfather    Emphysema Paternal  Grandfather    COPD Paternal Grandfather        Quit smoking 30-years-ago   Aneurysm Paternal Grandfather    Arthritis Paternal Grandfather    Anxiety disorder Sister     Social History   Socioeconomic History   Marital status: Single    Spouse name: Not on file   Number of children: Not on file   Years of education: Not on file   Highest education level: Not on file  Occupational History   Not on file  Tobacco Use   Smoking status: Former    Current packs/day: 0.00    Average packs/day: 1 pack/day for 17.0 years (17.0 ttl pk-yrs)    Types: Cigarettes, E-cigarettes    Start date: 01/29/2001    Quit date: 01/29/2018    Years  since quitting: 4.8    Passive exposure: Never   Smokeless tobacco: Never   Tobacco comments:    Quit 2019  Vaping Use   Vaping status: Some Days  Substance and Sexual Activity   Alcohol use: Yes    Alcohol/week: 1.0 standard drink of alcohol    Types: 1 Standard drinks or equivalent per week    Comment: Just on occasion   Drug use: No   Sexual activity: Yes    Birth control/protection: Pill    Comment: 1st intercourse 37 yo-More than 5 partners  Other Topics Concern   Not on file  Social History Narrative   Not on file   Social Determinants of Health   Financial Resource Strain: Not on file  Food Insecurity: No Food Insecurity (11/14/2022)   Hunger Vital Sign    Worried About Running Out of Food in the Last Year: Never true    Ran Out of Food in the Last Year: Never true  Transportation Needs: No Transportation Needs (11/14/2022)   PRAPARE - Administrator, Civil Service (Medical): No    Lack of Transportation (Non-Medical): No  Physical Activity: Not on file  Stress: Not on file  Social Connections: Not on file         Review of Systems currently denies fever, headache, chest pain, dyspnea, cough, abdominal pain, nausea, vomiting or bleeding.  She does have occasional back pain.  Vital Signs:  Vitals:   12/12/22 0700  BP: (!) 120/91  Pulse: 88  Resp: 18  Temp: 98.8 F (37.1 C)  SpO2: 100%      Code Status: FULL CODE    Physical Exam: Awake, alert.  Chest clear to auscultation bilaterally.  Heart with regular rate and rhythm.  Abdomen soft, positive bowel sounds, nontender.  No lower extremity edema.  Imaging: No results found.  Labs:  CBC: Recent Labs    09/17/22 0813 10/24/22 1531  WBC 5.8 6.4  HGB 12.4 12.7  HCT 36.3 36.9  PLT 431.0* 440*    COAGS: Recent Labs    09/19/22 0804  INR 0.9  APTT 27.3    BMP: Recent Labs    09/17/22 0813 10/31/22 0923  NA 137 137  K 3.6 3.9  CL 103 102  CO2 25 25  GLUCOSE 125*  101*  BUN 13 17  CALCIUM 9.2 9.6  CREATININE 1.29* 1.27*    LIVER FUNCTION TESTS: Recent Labs    09/17/22 0813  BILITOT 0.5  AST 13  ALT 9  ALKPHOS 29*  PROT 6.7  ALBUMIN 4.4    TUMOR MARKERS: No results for input(s): "AFPTM", "CEA", "CA199", "CHROMGRNA" in the last 8760 hours.  Assessment and Plan: 37 y.o. 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 She is scheduled today for CT guided bone marrow biopsy for further evaluation/ rule out MPN. Risks and benefits of procedure was discussed with the patient/mother including, but not limited to bleeding, infection, damage to adjacent structures or low yield requiring additional tests.  All of the questions were answered and there is agreement to proceed.  Consent signed and in chart.    Thank you for this interesting consult.  I greatly enjoyed meeting Katie Green and look forward to participating in their care.  A copy of this report was sent to the requesting provider on this date.  Electronically Signed: D. Jeananne Rama, PA-C 12/11/2022, 11:33 AM   I spent a total of  20 minutes   in face to face in clinical consultation, greater than 50% of which was counseling/coordinating care for CT guided bone marrow biopsy

## 2022-12-11 NOTE — Consult Note (Deleted)
Chief Complaint: Patient was seen in consultation today for CT guided bone marrow biopsy  Referring Physician(s): Ribakove,Everett C  Supervising Physician: Marliss Coots  Patient Status: [facility] - Out-pt  History of Present Illness: Katie Green is a 37 y.o. 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 She is scheduled today for CT guided bone marrow biopsy for further evaluation/ rule out MPN.   Past Medical History:  Diagnosis Date   Anemia    Anxiety    Arthritis    Cervical dysplasia    LEEP for high grade 03/2010 margins clear, lgsil pap/C&B 09/2010, ASCUS neg HR HPV pap 04/2011   Complex regional pain syndrome    Left foot   Depression    HSV-2 (herpes simplex virus 2) infection    Hyperlipidemia 08/22/2022   Kidney stones    Nephrolithiasis 07/29/2016   Confirmed multiple stones via CT scan    Past Surgical History:  Procedure Laterality Date   CERVICAL BIOPSY  W/ LOOP ELECTRODE EXCISION  03/31/2010   high grade with free margins   GUM SURGERY      Allergies: Patient has no known allergies.  Medications: Prior to Admission medications   Medication Sig Start Date End Date Taking? Authorizing Provider  amphetamine-dextroamphetamine (ADDERALL) 20 MG tablet TAKE 1 TABLET BY MOUTH EVERY MORNING, AT NOON, AND AT 4PM 10/20/16   [provider]  diclofenac Sodium (VOLTAREN) 1 % GEL Apply 4 g topically 4 (four) times daily as needed. 09/19/22   Garnette Gunner, MD  ferrous sulfate 325 (65 FE) MG EC tablet Take 325 mg by mouth daily with supper.    [provider]  LORazepam (ATIVAN) 1 MG tablet Take 1 mg by mouth every 8 (eight) hours.    [provider]  norethindrone-ethinyl estradiol-FE (JUNEL FE 1/20) 1-20 MG-MCG tablet Take 1 tablet by mouth daily. 09/18/22   Olivia Mackie, NP   spironolactone (ALDACTONE) 50 MG tablet Take 50 mg by mouth daily.    [provider]  valACYclovir (VALTREX) 500 MG tablet Take 1 tablet (500 mg total) by mouth 2 (two) times daily. with outbreak. 11/05/20   Olivia Mackie, NP  VIIBRYD 40 MG TABS  09/28/21   [provider]  vitamin B-12 (CYANOCOBALAMIN) 1000 MCG tablet Take 1,000 mcg by mouth daily.     [provider]     Family History  Problem Relation Age of Onset   Hypertension Mother    Kidney Stones Mother    Hyperlipidemia Mother    Diabetes Mother    Anxiety disorder Mother    Kidney disease Mother    Heart attack Father    Diabetes Father    Hearing loss Father    Heart disease Father    Hyperlipidemia Maternal Grandmother    Hypertension Maternal Grandmother    Fibromyalgia Maternal Grandmother    Dementia Maternal Grandmother    Arthritis Maternal Grandmother    Hypertension Maternal Grandfather    Hyperlipidemia Maternal Grandfather    Aneurysm Maternal Grandfather    Deep vein thrombosis Maternal Grandfather    Hearing loss Maternal Grandfather    Emphysema Paternal Grandmother        Smoker   Stroke Paternal Grandmother    Heart disease Paternal Grandfather    Hyperlipidemia Paternal Grandfather    CAD Paternal Grandfather    Emphysema Paternal  Grandfather    COPD Paternal Grandfather        Quit smoking 30-years-ago   Aneurysm Paternal Grandfather    Arthritis Paternal Grandfather    Anxiety disorder Sister     Social History   Socioeconomic History   Marital status: Single    Spouse name: Not on file   Number of children: Not on file   Years of education: Not on file   Highest education level: Not on file  Occupational History   Not on file  Tobacco Use   Smoking status: Former    Current packs/day: 0.00    Average packs/day: 1 pack/day for 17.0 years (17.0 ttl pk-yrs)    Types: Cigarettes, E-cigarettes    Start date: 01/29/2001    Quit date: 01/29/2018    Years  since quitting: 4.8    Passive exposure: Never   Smokeless tobacco: Never   Tobacco comments:    Quit 2019  Vaping Use   Vaping status: Some Days  Substance and Sexual Activity   Alcohol use: Yes    Alcohol/week: 1.0 standard drink of alcohol    Types: 1 Standard drinks or equivalent per week    Comment: Just on occasion   Drug use: No   Sexual activity: Yes    Birth control/protection: Pill    Comment: 1st intercourse 37 yo-More than 5 partners  Other Topics Concern   Not on file  Social History Narrative   Not on file   Social Determinants of Health   Financial Resource Strain: Not on file  Food Insecurity: No Food Insecurity (11/14/2022)   Hunger Vital Sign    Worried About Running Out of Food in the Last Year: Never true    Ran Out of Food in the Last Year: Never true  Transportation Needs: No Transportation Needs (11/14/2022)   PRAPARE - Administrator, Civil Service (Medical): No    Lack of Transportation (Non-Medical): No  Physical Activity: Not on file  Stress: Not on file  Social Connections: Not on file      Review of Systems  Vital Signs:   Code Status:     Physical Exam  Imaging: No results found.  Labs:  CBC: Recent Labs    09/17/22 0813 10/24/22 1531  WBC 5.8 6.4  HGB 12.4 12.7  HCT 36.3 36.9  PLT 431.0* 440*    COAGS: Recent Labs    09/19/22 0804  INR 0.9  APTT 27.3    BMP: Recent Labs    09/17/22 0813 10/31/22 0923  NA 137 137  K 3.6 3.9  CL 103 102  CO2 25 25  GLUCOSE 125* 101*  BUN 13 17  CALCIUM 9.2 9.6  CREATININE 1.29* 1.27*    LIVER FUNCTION TESTS: Recent Labs    09/17/22 0813  BILITOT 0.5  AST 13  ALT 9  ALKPHOS 29*  PROT 6.7  ALBUMIN 4.4    TUMOR MARKERS: No results for input(s): "AFPTM", "CEA", "CA199", "CHROMGRNA" in the last 8760 hours.  Assessment and Plan: 37 y.o. 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 She is scheduled today for CT guided bone marrow biopsy for further evaluation/ rule out MPN. Risks and benefits of procedure was discussed with the patient  including, but not limited to bleeding, infection, damage to adjacent structures or low yield requiring additional tests.  All of  the questions were answered and there is agreement to proceed.  Consent signed and in chart.    Thank you for this interesting consult.  I greatly enjoyed meeting Mikaili A Ratzloff and look forward to participating in their care.  A copy of this report was sent to the requesting provider on this date.  Electronically Signed: D. Jeananne Rama, PA-C 12/11/2022, 11:19 AM   I spent a total of  20 minutes   in face to face in clinical consultation, greater than 50% of which was counseling/coordinating care for CT guided bone marrow biopsy

## 2022-12-12 ENCOUNTER — Other Ambulatory Visit: Payer: Self-pay

## 2022-12-12 ENCOUNTER — Ambulatory Visit (HOSPITAL_COMMUNITY)
Admission: RE | Admit: 2022-12-12 | Discharge: 2022-12-12 | Disposition: A | Discharge status: Home / Self Care | Payer: 59 | Source: Ambulatory Visit | Attending: Oncology | Admitting: Oncology

## 2022-12-12 ENCOUNTER — Encounter (HOSPITAL_COMMUNITY): Payer: Self-pay

## 2022-12-12 DIAGNOSIS — Q9359 Other deletions of part of a chromosome: Secondary | ICD-10-CM | POA: Insufficient documentation

## 2022-12-12 DIAGNOSIS — F32A Depression, unspecified: Secondary | ICD-10-CM | POA: Diagnosis not present

## 2022-12-12 DIAGNOSIS — E785 Hyperlipidemia, unspecified: Secondary | ICD-10-CM | POA: Insufficient documentation

## 2022-12-12 DIAGNOSIS — Z87891 Personal history of nicotine dependence: Secondary | ICD-10-CM | POA: Diagnosis not present

## 2022-12-12 DIAGNOSIS — F988 Other specified behavioral and emotional disorders with onset usually occurring in childhood and adolescence: Secondary | ICD-10-CM | POA: Diagnosis not present

## 2022-12-12 DIAGNOSIS — Z87442 Personal history of urinary calculi: Secondary | ICD-10-CM | POA: Insufficient documentation

## 2022-12-12 DIAGNOSIS — D75839 Thrombocytosis, unspecified: Secondary | ICD-10-CM | POA: Insufficient documentation

## 2022-12-12 DIAGNOSIS — F419 Anxiety disorder, unspecified: Secondary | ICD-10-CM | POA: Insufficient documentation

## 2022-12-12 DIAGNOSIS — Z862 Personal history of diseases of the blood and blood-forming organs and certain disorders involving the immune mechanism: Secondary | ICD-10-CM | POA: Insufficient documentation

## 2022-12-12 DIAGNOSIS — B002 Herpesviral gingivostomatitis and pharyngotonsillitis: Secondary | ICD-10-CM | POA: Insufficient documentation

## 2022-12-12 DIAGNOSIS — M1990 Unspecified osteoarthritis, unspecified site: Secondary | ICD-10-CM | POA: Insufficient documentation

## 2022-12-12 LAB — CBC WITH DIFFERENTIAL/PLATELET
Abs Immature Granulocytes: 0.01 10*3/uL (ref 0.00–0.07)
Basophils Absolute: 0.1 10*3/uL (ref 0.0–0.1)
Basophils Relative: 1 %
Eosinophils Absolute: 0.1 10*3/uL (ref 0.0–0.5)
Eosinophils Relative: 1 %
HCT: 36.2 % (ref 36.0–46.0)
Hemoglobin: 12.4 g/dL (ref 12.0–15.0)
Immature Granulocytes: 0 %
Lymphocytes Relative: 32 %
Lymphs Abs: 2.1 10*3/uL (ref 0.7–4.0)
MCH: 32.6 pg (ref 26.0–34.0)
MCHC: 34.3 g/dL (ref 30.0–36.0)
MCV: 95.3 fL (ref 80.0–100.0)
Monocytes Absolute: 0.4 10*3/uL (ref 0.1–1.0)
Monocytes Relative: 6 %
Neutro Abs: 4 10*3/uL (ref 1.7–7.7)
Neutrophils Relative %: 60 %
Platelets: 392 10*3/uL (ref 150–400)
RBC: 3.8 MIL/uL — ABNORMAL LOW (ref 3.87–5.11)
RDW: 12.2 % (ref 11.5–15.5)
WBC: 6.6 10*3/uL (ref 4.0–10.5)
nRBC: 0 % (ref 0.0–0.2)

## 2022-12-12 MED ORDER — FENTANYL CITRATE (PF) 100 MCG/2ML IJ SOLN
INTRAMUSCULAR | Status: AC
  Filled 2022-12-12: qty 2

## 2022-12-12 MED ORDER — FLUMAZENIL 0.5 MG/5ML IV SOLN
INTRAVENOUS | Status: AC
  Filled 2022-12-12: qty 5

## 2022-12-12 MED ORDER — SODIUM CHLORIDE 0.9 % IV SOLN: INTRAVENOUS | Status: DC

## 2022-12-12 MED ORDER — FENTANYL CITRATE (PF) 100 MCG/2ML IJ SOLN
INTRAMUSCULAR | Status: AC | PRN
Start: 2022-12-12 — End: 2022-12-12
  Administered 2022-12-12 (×2): 50 ug via INTRAVENOUS

## 2022-12-12 MED ORDER — NALOXONE HCL 0.4 MG/ML IJ SOLN
INTRAMUSCULAR | Status: AC
  Filled 2022-12-12: qty 1

## 2022-12-12 MED ORDER — MIDAZOLAM HCL 2 MG/2ML IJ SOLN
INTRAMUSCULAR | Status: AC | PRN
Start: 2022-12-12 — End: 2022-12-12
  Administered 2022-12-12: 1 mg via INTRAVENOUS

## 2022-12-12 MED ORDER — MIDAZOLAM HCL 2 MG/2ML IJ SOLN
INTRAMUSCULAR | Status: AC
  Filled 2022-12-12: qty 2

## 2022-12-12 NOTE — Discharge Instructions (Signed)
Discharge Instructions:   Please call Interventional Radiology clinic [phone] with any questions or concerns.  You may remove your dressing and shower tomorrow.  Bone Marrow Aspiration and Bone Marrow Biopsy, Adult, Care After This sheet gives you information about how to care for yourself after your procedure. Your health care provider may also give you more specific instructions. If you have problems or questions, contact your health care provider. What can I expect after the procedure? After the procedure, it is common to have: Mild pain and tenderness. Swelling. Bruising. Follow these instructions at home: Puncture site care  Follow instructions from your health care provider about how to take care of the puncture site. Make sure you: Wash your hands with soap and water before and after you change your bandage (dressing). If soap and water are not available, use hand sanitizer. Change your dressing as told by your health care provider. Check your puncture site every day for signs of infection. Check for: More redness, swelling, or pain. Fluid or blood. Warmth. Pus or a bad smell. Activity Return to your normal activities as told by your health care provider. Ask your health care provider what activities are safe for you. Do not lift anything that is heavier than 10 lb (4.5 kg), or the limit that you are told, until your health care provider says that it is safe. Do not drive for 24 hours if you were given a sedative during your procedure. General instructions  Take over-the-counter and prescription medicines only as told by your health care provider. Do not take baths, swim, or use a hot tub until your health care provider approves. Ask your health care provider if you may take showers. You may only be allowed to take sponge baths. If directed, put ice on the affected area. To do this: Put ice in a plastic bag. Place a towel between your skin and the bag. Leave the ice on  for 20 minutes, 2-3 times a day. Keep all follow-up visits as told by your health care provider. This is important. Contact a health care provider if: Your pain is not controlled with medicine. You have a fever. You have more redness, swelling, or pain around the puncture site. You have fluid or blood coming from the puncture site. Your puncture site feels warm to the touch. You have pus or a bad smell coming from the puncture site. Summary After the procedure, it is common to have mild pain, tenderness, swelling, and bruising. Follow instructions from your health care provider about how to take care of the puncture site and what activities are safe for you. Take over-the-counter and prescription medicines only as told by your health care provider. Contact a health care provider if you have any signs of infection, such as fluid or blood coming from the puncture site. This information is not intended to replace advice given to you by your health care provider. Make sure you discuss any questions you have with your health care provider. Document Revised: 08/03/2018 Document Reviewed: 08/03/2018 Elsevier Patient Education  2023 [employer]    Moderate Conscious Sedation, Adult, Care After This sheet gives you information about how to care for yourself after your procedure. Your health care provider may also give you more specific instructions. If you have problems or questions, contact your health care provider. What can I expect after the procedure? After the procedure, it is common to have: Sleepiness for several hours. Impaired judgment for several hours. Difficulty with balance. Vomiting if you  eat too soon. Follow these instructions at home: For the time period you were told by your health care provider:   Rest. Do not participate in activities where you could fall or become injured. Do not drive or use machinery. Do not drink alcohol. Do not take sleeping pills or medicines  that cause drowsiness. Do not make important decisions or sign legal documents. Do not take care of children on your own. Eating and drinking  Follow the diet recommended by your health care provider. Drink enough fluid to keep your urine pale yellow. If you vomit: Drink water, juice, or soup when you can drink without vomiting. Make sure you have little or no nausea before eating solid foods. General instructions Take over-the-counter and prescription medicines only as told by your health care provider. Have a responsible adult stay with you for the time you are told. It is important to have someone help care for you until you are awake and alert. Do not smoke. Keep all follow-up visits as told by your health care provider. This is important. Contact a health care provider if: You are still sleepy or having trouble with balance after 24 hours. You feel light-headed. You keep feeling nauseous or you keep vomiting. You develop a rash. You have a fever. You have redness or swelling around the IV site. Get help right away if: You have trouble breathing. You have new-onset confusion at home. Summary After the procedure, it is common to feel sleepy, have impaired judgment, or feel nauseous if you eat too soon. Rest after you get home. Know the things you should not do after the procedure. Follow the diet recommended by your health care provider and drink enough fluid to keep your urine pale yellow. Get help right away if you have trouble breathing or new-onset confusion at home. This information is not intended to replace advice given to you by your health care provider. Make sure you discuss any questions you have with your health care provider. Document Revised: 07/15/2019 Document Reviewed: 02/10/2019 Elsevier Patient Education  2023 [employer].

## 2022-12-12 NOTE — Procedures (Signed)
Interventional Radiology Procedure Note  Procedure: CT guided aspirate and core biopsy of right iliac bone  Complications: None  Recommendations: - Bedrest supine x 1 hrs - Hydrocodone PRN  Pain - Follow biopsy results   Marliss Coots, MD

## 2022-12-12 NOTE — Progress Notes (Signed)
Ice bag given to use prn to low back for comfort.

## 2022-12-15 LAB — SURGICAL PATHOLOGY

## 2022-12-17 ENCOUNTER — Inpatient Hospital Stay: Payer: 59 | Admitting: Genetic Counselor

## 2022-12-17 ENCOUNTER — Inpatient Hospital Stay: Payer: 59

## 2022-12-18 ENCOUNTER — Other Ambulatory Visit: Payer: Self-pay | Admitting: Physician Assistant

## 2022-12-18 DIAGNOSIS — D75839 Thrombocytosis, unspecified: Secondary | ICD-10-CM

## 2022-12-19 ENCOUNTER — Inpatient Hospital Stay: Payer: 59

## 2022-12-19 ENCOUNTER — Inpatient Hospital Stay: Payer: 59 | Attending: Oncology | Admitting: Physician Assistant

## 2022-12-19 VITALS — BP 117/95 | HR 88 | Temp 97.3°F | Resp 17 | Ht 63.0 in | Wt 92.9 lb

## 2022-12-19 DIAGNOSIS — D75839 Thrombocytosis, unspecified: Secondary | ICD-10-CM | POA: Insufficient documentation

## 2022-12-19 DIAGNOSIS — Z79624 Long term (current) use of inhibitors of nucleotide synthesis: Secondary | ICD-10-CM | POA: Diagnosis not present

## 2022-12-19 DIAGNOSIS — Z87891 Personal history of nicotine dependence: Secondary | ICD-10-CM | POA: Insufficient documentation

## 2022-12-19 DIAGNOSIS — Z79899 Other long term (current) drug therapy: Secondary | ICD-10-CM | POA: Insufficient documentation

## 2022-12-19 LAB — CBC WITH DIFFERENTIAL (CANCER CENTER ONLY)
Abs Immature Granulocytes: 0.01 10*3/uL (ref 0.00–0.07)
Basophils Absolute: 0.1 10*3/uL (ref 0.0–0.1)
Basophils Relative: 1 %
Eosinophils Absolute: 0 10*3/uL (ref 0.0–0.5)
Eosinophils Relative: 0 %
HCT: 38 % (ref 36.0–46.0)
Hemoglobin: 13 g/dL (ref 12.0–15.0)
Immature Granulocytes: 0 %
Lymphocytes Relative: 33 %
Lymphs Abs: 2 10*3/uL (ref 0.7–4.0)
MCH: 32 pg (ref 26.0–34.0)
MCHC: 34.2 g/dL (ref 30.0–36.0)
MCV: 93.6 fL (ref 80.0–100.0)
Monocytes Absolute: 0.3 10*3/uL (ref 0.1–1.0)
Monocytes Relative: 5 %
Neutro Abs: 3.6 10*3/uL (ref 1.7–7.7)
Neutrophils Relative %: 61 %
Platelet Count: 460 10*3/uL — ABNORMAL HIGH (ref 150–400)
RBC: 4.06 MIL/uL (ref 3.87–5.11)
RDW: 12.1 % (ref 11.5–15.5)
WBC Count: 5.9 10*3/uL (ref 4.0–10.5)
nRBC: 0 % (ref 0.0–0.2)

## 2022-12-19 LAB — CMP (CANCER CENTER ONLY)
ALT: 13 U/L (ref 0–44)
AST: 15 U/L (ref 15–41)
Albumin: 4.6 g/dL (ref 3.5–5.0)
Alkaline Phosphatase: 34 U/L — ABNORMAL LOW (ref 38–126)
Anion gap: 6 (ref 5–15)
BUN: 17 mg/dL (ref 6–20)
CO2: 27 mmol/L (ref 22–32)
Calcium: 9.8 mg/dL (ref 8.9–10.3)
Chloride: 104 mmol/L (ref 98–111)
Creatinine: 1.37 mg/dL — ABNORMAL HIGH (ref 0.44–1.00)
GFR, Estimated: 50 mL/min — ABNORMAL LOW (ref >60)
Glucose, Bld: 109 mg/dL — ABNORMAL HIGH (ref 70–99)
Potassium: 4.5 mmol/L (ref 3.5–5.1)
Sodium: 137 mmol/L (ref 135–145)
Total Bilirubin: 0.5 mg/dL (ref 0.3–1.2)
Total Protein: 7.2 g/dL (ref 6.5–8.1)

## 2022-12-19 NOTE — Progress Notes (Unsigned)
[facility] [phone]   [phone]  PROGRESS NOTE  Patient Care Team: Garnette Gunner, MD as PCP - General (Family Medicine)   CHIEF COMPLAINTS/PURPOSE OF CONSULTATION:  Thrombocytosis  HISTORY OF PRESENTING ILLNESS:  Katie Green 37 y.o. female returns for follow-up for thrombocytosis.  She was last seen by Dr. Angelene Giovanni on 11/14/2022.  In the interim, she has undergone a bone marrow biopsy.  On exam today, Ms. Katie Green reports that she is overall stable without any new or concerning symptoms.  She reports her energy levels are overall stable.  She is able to complete all her daily activities on her own.  She denies any nausea, vomiting or bowel habit changes.  She denies easy bruising or signs of active bleeding.  She denies fevers, chills, night sweats, shortness of breath, chest pain or cough.  She has no other complaints.  Rest of the 10 point is below.  MEDICAL HISTORY:  Past Medical History:  Diagnosis Date   Anemia    Anxiety    Arthritis    Cervical dysplasia    LEEP for high grade 03/2010 margins clear, lgsil pap/C&B 09/2010, ASCUS neg HR HPV pap 04/2011   Complex regional pain syndrome    Left foot   Depression    HSV-2 (herpes simplex virus 2) infection    Hyperlipidemia 08/22/2022   Kidney stones    Nephrolithiasis 07/29/2016   Confirmed multiple stones via CT scan    SURGICAL HISTORY: Past Surgical History:  Procedure Laterality Date   CERVICAL BIOPSY  W/ LOOP ELECTRODE EXCISION  03/31/2010   high grade with free margins   GUM SURGERY      SOCIAL HISTORY: Social History   Socioeconomic History   Marital status: Single    Spouse name: Not on file   Number of children: Not on file   Years of education: Not on file   Highest education level: Not on file  Occupational History   Not on file  Tobacco Use   Smoking status: Former    Current packs/day: 0.00    Average packs/day: 1 pack/day for 17.0 years (17.0 ttl  pk-yrs)    Types: Cigarettes, E-cigarettes    Start date: 01/29/2001    Quit date: 01/29/2018    Years since quitting: 4.8    Passive exposure: Never   Smokeless tobacco: Never   Tobacco comments:    Quit 2019  Vaping Use   Vaping status: Some Days  Substance and Sexual Activity   Alcohol use: Yes    Alcohol/week: 1.0 standard drink of alcohol    Types: 1 Standard drinks or equivalent per week    Comment: Just on occasion   Drug use: No   Sexual activity: Yes    Birth control/protection: Pill    Comment: 1st intercourse 37 yo-More than 5 partners  Other Topics Concern   Not on file  Social History Narrative   Not on file   Social Determinants of Health   Financial Resource Strain: Not on file  Food Insecurity: No Food Insecurity (11/14/2022)   Hunger Vital Sign    Worried About Running Out of Food in the Last Year: Never true    Ran Out of Food in the Last Year: Never true  Transportation Needs: No Transportation Needs (11/14/2022)   PRAPARE - Administrator, Civil Service (Medical): No    Lack of Transportation (Non-Medical): No  Physical Activity: Not on file  Stress: Not on file  Social Connections: Not on file  Intimate Partner Violence: Not At Risk (11/14/2022)   Humiliation, Afraid, Rape, and Kick questionnaire    Fear of Current or Ex-Partner: No    Emotionally Abused: No    Physically Abused: No    Sexually Abused: No    FAMILY HISTORY: Family History  Problem Relation Age of Onset   Hypertension Mother    Kidney Stones Mother    Hyperlipidemia Mother    Diabetes Mother    Anxiety disorder Mother    Kidney disease Mother    Heart attack Father    Diabetes Father    Hearing loss Father    Heart disease Father    Hyperlipidemia Maternal Grandmother    Hypertension Maternal Grandmother    Fibromyalgia Maternal Grandmother    Dementia Maternal Grandmother    Arthritis Maternal Grandmother    Hypertension Maternal Grandfather     Hyperlipidemia Maternal Grandfather    Aneurysm Maternal Grandfather    Deep vein thrombosis Maternal Grandfather    Hearing loss Maternal Grandfather    Emphysema Paternal Grandmother        Smoker   Stroke Paternal Grandmother    Heart disease Paternal Grandfather    Hyperlipidemia Paternal Grandfather    CAD Paternal Grandfather    Emphysema Paternal Grandfather    COPD Paternal Grandfather        Quit smoking 30-years-ago   Aneurysm Paternal Grandfather    Arthritis Paternal Grandfather    Anxiety disorder Sister     ALLERGIES:  has No Known Allergies.  MEDICATIONS:  Current Outpatient Medications  Medication Sig Dispense Refill   amphetamine-dextroamphetamine (ADDERALL) 20 MG tablet TAKE 1 TABLET BY MOUTH EVERY MORNING, AT NOON, AND AT 4PM  0   ferrous sulfate 325 (65 FE) MG EC tablet Take 325 mg by mouth daily with supper.     ibuprofen (ADVIL) 200 MG tablet Take 200 mg by mouth every 6 (six) hours as needed for moderate pain.     LORazepam (ATIVAN) 1 MG tablet Take 1 mg by mouth every 8 (eight) hours.     norethindrone-ethinyl estradiol-FE (JUNEL FE 1/20) 1-20 MG-MCG tablet Take 1 tablet by mouth daily. 84 tablet 3   spironolactone (ALDACTONE) 50 MG tablet Take 50 mg by mouth daily.     valACYclovir (VALTREX) 500 MG tablet Take 1 tablet (500 mg total) by mouth 2 (two) times daily. with outbreak. 30 tablet 3   VIIBRYD 40 MG TABS      vitamin B-12 (CYANOCOBALAMIN) 1000 MCG tablet Take 1,000 mcg by mouth daily.      diclofenac Sodium (VOLTAREN) 1 % GEL Apply 4 g topically 4 (four) times daily as needed. (Patient not taking: Reported on 12/19/2022) 100 g 3   No current facility-administered medications for this visit.    REVIEW OF SYSTEMS:   Constitutional: ( - ) fevers, ( - )  chills , ( - ) night sweats Eyes: ( - ) blurriness of vision, ( - ) double vision, ( - ) watery eyes Ears, nose, mouth, throat, and face: ( - ) mucositis, ( - ) sore throat Respiratory: ( - ) cough,  ( - ) dyspnea, ( - ) wheezes Cardiovascular: ( - ) palpitation, ( - ) chest discomfort, ( - ) lower extremity swelling Gastrointestinal:  ( - ) nausea, ( - ) heartburn, ( - ) change in bowel habits Skin: ( - ) abnormal skin rashes Lymphatics: ( - ) new lymphadenopathy, ( - ) easy  bruising Neurological: ( - ) numbness, ( - ) tingling, ( - ) new weaknesses Behavioral/Psych: ( - ) mood change, ( - ) new changes  All other systems were reviewed with the patient and are negative.  PHYSICAL EXAMINATION: ECOG PERFORMANCE STATUS: 0 - Asymptomatic  Vitals:   12/19/22 1152 12/19/22 1159  BP: (!) 127/104 (!) 117/95  Pulse: 88   Resp: 17   Temp: (!) 97.3 F (36.3 C)   SpO2: 100%    Filed Weights   12/19/22 1152  Weight: 92 lb 14.4 oz (42.1 kg)    GENERAL: well appearing female in NAD  SKIN: skin color, texture, turgor are normal, no rashes or significant lesions EYES: conjunctiva are pink and non-injected, sclera clear LUNGS: clear to auscultation and percussion with normal breathing effort HEART: regular rate & rhythm and no murmurs and no lower extremity edema Musculoskeletal: no cyanosis of digits and no clubbing  PSYCH: alert & oriented x 3, fluent speech NEURO: no focal motor/sensory deficits  LABORATORY DATA:  I have reviewed the data as listed    Latest Ref Rng & Units 12/19/2022   11:21 AM 12/12/2022    7:28 AM 10/24/2022    3:31 PM  CBC  WBC 4.0 - 10.5 K/uL 5.9  6.6  6.4   Hemoglobin 12.0 - 15.0 g/dL 09.8  11.9  14.7   Hematocrit 36.0 - 46.0 % 38.0  36.2  36.9   Platelets 150 - 400 K/uL 460  392  440        Latest Ref Rng & Units 12/19/2022   11:21 AM 10/31/2022    9:23 AM 09/17/2022    8:13 AM  CMP  Glucose 70 - 99 mg/dL 829  562  130   BUN 6 - 20 mg/dL 17  17  13    Creatinine 0.44 - 1.00 mg/dL 8.65  7.84  6.96   Sodium 135 - 145 mmol/L 137  137  137   Potassium 3.5 - 5.1 mmol/L 4.5  3.9  3.6   Chloride 98 - 111 mmol/L 104  102  103   CO2 22 - 32 mmol/L 27  25  25     Calcium 8.9 - 10.3 mg/dL 9.8  9.6  9.2   Total Protein 6.5 - 8.1 g/dL 7.2   6.7   Total Bilirubin 0.3 - 1.2 mg/dL 0.5   0.5   Alkaline Phos 38 - 126 U/L 34   29   AST 15 - 41 U/L 15   13   ALT 0 - 44 U/L 13   9      PATHOLOGY:  Surgical Pathology  CASE: [id number]  PATIENT: Katie Green  Bone Marrow Report   Clinical History: thrombocytopenia   DIAGNOSIS:   BONE MARROW, ASPIRATE, CLOT, CORE:  -Normocellular bone marrow for age with trilineage hematopoiesis  -See comment   PERIPHERAL BLOOD:  -No significant morphologic abnormalities   COMMENT:   There are no morphologic features diagnostic of a myeloid neoplasm.  Correlation with cytogenetic studies is recommended.    RADIOGRAPHIC STUDIES: I have personally reviewed the radiological images as listed and agreed with the findings in the report. CT BONE MARROW ASPIRATION  Result Date: 12/12/2022 INDICATION: 37 year old female with history of thrombocytosis. EXAM: CT-GUIDED BONE MARROW BIOPSY AND ASPIRATION MEDICATIONS: None ANESTHESIA/SEDATION: Fentanyl 100 mcg IV; Versed 2 mg IV Sedation Time: 22 minutes; The patient was continuously monitored during the procedure by the interventional radiology nurse under my direct supervision. COMPLICATIONS: None immediate. PROCEDURE:  Informed consent was obtained from the patient following an explanation of the procedure, risks, benefits and alternatives. The patient understands, agrees and consents for the procedure. All questions were addressed. A time out was performed prior to the initiation of the procedure. The patient was positioned prone and non-contrast localization CT was performed of the pelvis to demonstrate the iliac marrow spaces. The operative site was prepped and draped in the usual sterile fashion. Under sterile conditions and local anesthesia, a 22 gauge spinal needle was utilized for procedural planning. Next, an 11 gauge coaxial bone biopsy needle was advanced into the  right iliac marrow space. Needle position was confirmed with CT imaging. Initially, a bone marrow aspiration was performed. Next, a bone marrow biopsy was obtained with the 11 gauge outer bone marrow device. Samples were prepared with the cytotechnologist and deemed adequate. The needle was removed and superficial hemostasis was obtained with manual compression. A dressing was applied. The patient tolerated the procedure well without immediate post procedural complication. IMPRESSION: Successful CT guided right iliac bone marrow aspiration and core biopsy. Marliss Coots, MD [facility] Electronically Signed   By: Marliss Coots M.D.   On: 12/12/2022 12:23    ASSESSMENT & PLAN Katie Green is a 37 y.o. female who presents to the clinic for a follow up for thrombocytosis.   #Thrombocytosis: --Workup from 09/17/2022 checked inflammatory markers and iron panel that were unremarkable.  --Underwent MPN workup on 10/24/2022. 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 No BCR/ABL rearrangement.  --Underwent bone marrow biopsy on 12/12/2022 that showed normocellular bone marrow with trilineage hematopoiesis. Cytogenetics pending. --Labs today show platelet count of 460K.  --No indication for cytoreductive therapy.  --Monitor blood counts and return in 6 months with repeat labs. If platelet counts are stable at next visit, we can discharge patient with PCP to monitor.     No orders of the defined types were placed in this encounter.   All questions were answered. The patient knows to call the clinic with any problems, questions or concerns.  I have spent a total of 30 minutes minutes of face-to-face and non-face-to-face time, preparing to see the patient, performing a medically appropriate examination, counseling and educating the patient, documenting clinical information in the  electronic health record, independently interpreting results and communicating results to the patient, and care coordination.   Georga Kaufmann, PA-C Department of Hematology/Oncology [facility] Phone: [phone]  Patient was seen with Dr. Leonides Schanz.   I have read the above note and personally examined the patient. I agree with the assessment and plan as noted above.  Briefly Ms. Katie Green is a 37 year old female who presents for evaluation of thrombocytosis.  The patient was previously seen by hematologist in our practice who noted she had an MPN due to a tier 2 mutation.  Labs today show white blood cell count 5.9, hemoglobin 13.0, and platelets of 460.  At this time there is no indication for cytoreductive therapy.  Recommend the patient return to clinic in 6 months time for reevaluation.  If levels are stable in 6 months would recommend close following with PCP and rereferral and event the platelets were to begin trending higher.   Ulysees Barns, MD Department of Hematology/Oncology [facility] Phone: [phone] Pager: [phone] Email: [email or web address]

## 2022-12-26 ENCOUNTER — Encounter (HOSPITAL_COMMUNITY): Payer: Self-pay

## 2023-01-22 ENCOUNTER — Telehealth: Payer: Self-pay | Admitting: Genetic Counselor

## 2023-01-22 ENCOUNTER — Inpatient Hospital Stay: Payer: 59

## 2023-01-22 ENCOUNTER — Inpatient Hospital Stay: Payer: 59 | Admitting: Genetic Counselor

## 2023-01-22 NOTE — Telephone Encounter (Signed)
Called patient to reschedule genetics appt.  Rescheduled for 10/31 at 2pm.

## 2023-01-28 ENCOUNTER — Telehealth: Payer: Self-pay | Admitting: Genetic Counselor

## 2023-01-28 NOTE — Telephone Encounter (Signed)
Patient called and LVM as she is unable to make her genetic counseling appt.  She stated she would like to reschedule.  Returned call and no answer.  Requested call back to get her rescheduled.  Also left number to get in touch with schedulers.

## 2023-01-29 ENCOUNTER — Encounter: Payer: 59 | Admitting: Genetic Counselor

## 2023-01-30 ENCOUNTER — Telehealth: Payer: Self-pay | Admitting: Genetic Counselor

## 2023-01-30 NOTE — Telephone Encounter (Signed)
Called and left message for patient to call office and confirm appt.

## 2023-02-10 ENCOUNTER — Other Ambulatory Visit: Payer: Self-pay | Admitting: Medical Genetics

## 2023-02-10 DIAGNOSIS — Z006 Encounter for examination for normal comparison and control in clinical research program: Secondary | ICD-10-CM

## 2023-02-25 ENCOUNTER — Other Ambulatory Visit: Payer: Self-pay

## 2023-02-25 ENCOUNTER — Emergency Department (HOSPITAL_BASED_OUTPATIENT_CLINIC_OR_DEPARTMENT_OTHER)
Admission: EM | Admit: 2023-02-25 | Discharge: 2023-02-25 | Disposition: A | Discharge status: Home / Self Care | Payer: 59 | Attending: Emergency Medicine | Admitting: Emergency Medicine

## 2023-02-25 ENCOUNTER — Ambulatory Visit: Payer: 59 | Admitting: Family Medicine

## 2023-02-25 ENCOUNTER — Emergency Department (HOSPITAL_BASED_OUTPATIENT_CLINIC_OR_DEPARTMENT_OTHER): Payer: 59

## 2023-02-25 ENCOUNTER — Telehealth: Payer: Self-pay | Admitting: Family Medicine

## 2023-02-25 ENCOUNTER — Encounter (HOSPITAL_BASED_OUTPATIENT_CLINIC_OR_DEPARTMENT_OTHER): Payer: Self-pay

## 2023-02-25 DIAGNOSIS — Z79899 Other long term (current) drug therapy: Secondary | ICD-10-CM | POA: Diagnosis not present

## 2023-02-25 DIAGNOSIS — M5412 Radiculopathy, cervical region: Secondary | ICD-10-CM | POA: Diagnosis not present

## 2023-02-25 DIAGNOSIS — R202 Paresthesia of skin: Secondary | ICD-10-CM

## 2023-02-25 DIAGNOSIS — R200 Anesthesia of skin: Secondary | ICD-10-CM | POA: Diagnosis present

## 2023-02-25 HISTORY — DX: Thrombocytosis, unspecified: D75.839

## 2023-02-25 MED ORDER — METHYLPREDNISOLONE 4 MG PO TBPK: ORAL_TABLET | ORAL | 0 refills | Status: DC

## 2023-02-25 MED ORDER — PREDNISONE 20 MG PO TABS
40.0000 mg | ORAL_TABLET | Freq: Once | ORAL | Status: AC
  Administered 2023-02-25: 40 mg via ORAL
  Filled 2023-02-25: qty 2

## 2023-02-25 NOTE — ED Triage Notes (Signed)
Pt presents with tingling and numbness to left hand last Thursday Progressed to numbness/pins and needles to left arm and hand and resolved after 2 hrs. Intermittently numbness and tingling since then. No numbness or tingling at this time. No injury, just reports neck soreness

## 2023-02-25 NOTE — Discharge Instructions (Signed)
1.  A follow-up appoint with your doctor for approximately 1 week. 2.  You were given a dose of prednisone in the emergency department this evening.  Pick up the Medrol Dosepak and start tomorrow per package instructions. 3.  At this time I most suspect a pinched nerve in the neck.  You had some x-rays done in the emergency department.  You do have some arthritic and narrowing of joints developing.  You may need a follow-up MRI if symptoms are persisting or worsening. 4.  Return to the emergency room immediately if you have other weakness, any slurred speech, visual disturbances imbalance or leg weakness.

## 2023-02-25 NOTE — Telephone Encounter (Signed)
Pt scheduled an OV with Dr Janee Morn on 02/25/23 for-Tingling and numbness in my left arm and hand. I called pt and transferred her over to nurse triage.

## 2023-02-25 NOTE — ED Provider Notes (Signed)
[facility] EM[facility]Y DEPARTMENT AT [facility] Provider Note   CSN: [id number] Arrival date & time: 02/25/23  1404     History  Chief Complaint  Patient presents with   Numbness    Katie Green is a 37 y.o. female.  HPI Patient reports that she has developed left arm numbness that has been waxing and waning.  Symptoms for started about 6 days ago.  The severity of the symptoms that has been variable.  Patient reports at 1 point time she was sitting on her couch and her whole arm felt numb.  She reports that that did resolve after a couple of hours and intermittently she is getting some tingling sensation and will give numbness that is predominantly to the volar and lateral aspect of her forearm to the thumb and forefinger.  She reports sometimes is also felt like there is some numbness at the back of the upper arm.  Patient denies any other associated symptoms.  She reports otherwise she feels well.  She has been doing her job as usual.  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  She denies she does any heavy lifting or pulling.  No injuries.  Patient does report that she has had problems with nerve impingement in the right arm.  That something that she has dealt with for several years.  She has gone to physical therapy and has had improvement.  Patient denies she has significant neck pain.    Home Medications Prior to Admission medications   Medication Sig Start Date End Date Taking? Authorizing Provider  methylPREDNISolone (MEDROL DOSEPAK) 4 MG TBPK tablet Per dose pack instructions 02/25/23  Yes Priyal Musquiz, Lebron Conners, MD  amphetamine-dextroamphetamine (ADDERALL) 20 MG tablet TAKE 1 TABLET BY MOUTH EVERY MORNING, AT NOON, AND AT 4PM 10/20/16   [provider]  ferrous sulfate 325 (65 FE) MG EC tablet Take 325 mg by mouth daily with supper.    [provider]  ibuprofen (ADVIL) 200 MG tablet Take 200 mg by mouth every 6 (six) hours as needed for  moderate pain.    [provider]  LORazepam (ATIVAN) 1 MG tablet Take 1 mg by mouth every 8 (eight) hours.    [provider]  norethindrone-ethinyl estradiol-FE (JUNEL FE 1/20) 1-20 MG-MCG tablet Take 1 tablet by mouth daily. 09/18/22   Olivia Mackie, NP  spironolactone (ALDACTONE) 50 MG tablet Take 50 mg by mouth daily.    [provider]  valACYclovir (VALTREX) 500 MG tablet Take 1 tablet (500 mg total) by mouth 2 (two) times daily. with outbreak. 11/05/20   Olivia Mackie, NP  VIIBRYD 40 MG TABS  09/28/21   [provider]  vitamin B-12 (CYANOCOBALAMIN) 1000 MCG tablet Take 1,000 mcg by mouth daily.     [provider]      Allergies    Patient has no known allergies.    Review of Systems   Review of Systems  Physical Exam Updated Vital Signs BP (!) 148/89 (BP Location: Right Arm)   Pulse 86   Temp 98 F (36.7 C) (Oral)   Resp 18   Wt 43.1 kg   LMP 02/18/2023   SpO2 100%   BMI 16.83 kg/m  Physical Exam Constitutional:      Comments: Patient is alert nontoxic.  Well in appearance.  Well-nourished well-developed.  No acute distress.  HENT:     Head: Normocephalic and atraumatic.     Mouth/Throat:  Pharynx: Oropharynx is clear.  Eyes:     Extraocular Movements: Extraocular movements intact.  Neck:     Comments: Neck supple without lymphadenopathy.  No midline C-spine tenderness.  Range of motion intact.  No meningismus. Cardiovascular:     Rate and Rhythm: Normal rate and regular rhythm.  Pulmonary:     Effort: Pulmonary effort is normal.     Breath sounds: Normal breath sounds.  Musculoskeletal:        General: Normal range of motion.     Cervical back: Neck supple.     Comments: Lateral upper extremities are symmetric.  There is no edema no swelling no deformity.  Radial pulses are 2+ and strong.  I can perform full passive range of motion on the left upper extremity.  No joint effusions or soft tissue  abnormalities.  Patient is ambulatory without difficulty.  No pain with transitions from supine sitting and standing.  Skin:    General: Skin is warm and dry.  Neurological:     Comments: Alert.  Normal mental status.  GCS 15.  Grip strength bilateral upper extremities is 5\5.  Patient has normal intact 5\5 extension and flexion against resistance of the upper extremities.  Patient has intact intrinsic hand muscles of the left upper extremity.  No weakness.  Patient perceives a slight decrease sensation along the thumb and the fourth finger and the radial aspect of the wrist to light touch but sensation is intact.  Otherwise normal intact sensation.  Psychiatric:        Mood and Affect: Mood normal.     ED Results / Procedures / Treatments   Labs (all labs ordered are listed, but only abnormal results are displayed) Labs Reviewed - No data to display  EKG EKG Interpretation Date/Time:  Wednesday February 25 2023 14:17:32 EST Ventricular Rate:  95 PR Interval:  128 QRS Duration:  68 QT Interval:  332 QTC Calculation: 417 R Axis:   88  Text Interpretation: Sinus rhythm with occasional Premature ventricular complexes Right atrial enlargement Minimal voltage criteria for LVH, may be normal variant ( Sokolow-Lyon ) Borderline ECG No previous ECGs available Confirmed by Beckey Downing [phone]) on 02/25/2023 2:45:23 PM  Radiology DG Cervical Spine Complete  Result Date: 02/25/2023 CLINICAL DATA:  Radicular pain right arm.  No history of trauma EXAM: CERVICAL SPINE - COMPLETE 5 VIEW COMPARISON:  None Available. FINDINGS: Preserved vertebral body height, alignment prevertebral soft tissues. Slight disc height loss at C6-7. Minimal endplate osteophyte. No listhesis. On oblique views there is some encroachment of the neural foramen at C6-7 on the right. Preserved bone mineralization. If there is further concern of significant stenosis, MRI or myelographic CT could be considered as clinically  appropriate. IMPRESSION: Minimal degenerative changes with some osseous neural foraminal encroachment at C6-7 on the right Electronically Signed   By: Karen Kays M.D.   On: 02/25/2023 17:21    Procedures Procedures    Medications Ordered in ED Medications  predniSONE (DELTASONE) tablet 40 mg (40 mg Oral Given 02/25/23 1737)    ED Course/ Medical Decision Making/ A&P                                 Medical Decision Making Amount and/or Complexity of Data Reviewed Radiology: ordered.  Risk Prescription drug management.   Patient presents as outlined with approximately 6 days of waxing and waning paresthesia and numbness to the left upper  extremity.  Patient has not had weakness.  There are no other associated symptoms to suggest stroke symptoms.  Patient has prior history of right upper extremity radicular symptoms and weakness requiring physical therapy.  Current neurologic examination is intact with normal strength throughout.  Vascular examination is normal.  Will obtain plain film x-rays for evaluation of any degenerative appearance or disc space narrowing.  Film x-rays shows some foraminal narrowing at C6 and 7 on the right.  This corresponds with the patient's more chronic symptoms.  She also has some osteophyte formation.  At this time with otherwise completely normal neurologic and vascular exam and history as outlined, I have highest suspicion for cervical radiculopathy.  I currently have low suspicion for CVA as etiology.  Patient does not have any risk factors and quality of numbness and distribution is more suggestive of neuropathic etiology.  At this time I have reviewed findings and thought process with the patient.  We will trial a Medrol Dosepak.  Patient was given 1 oral dose of prednisone 40 mg in the emergency department as evening and to start Dosepak tomorrow.  We have reviewed strict return precautions and patient will be following up with PCP to assess response to  Medrol Dosepak.  Patient voices understanding of current plan, return precautions and follow-up plan.        Final Clinical Impression(s) / ED Diagnoses Final diagnoses:  Paresthesia  Cervical radiculopathy    Rx / DC Orders ED Discharge Orders          Ordered    methylPREDNISolone (MEDROL DOSEPAK) 4 MG TBPK tablet        02/25/23 1635              Arby Barrette, MD 02/25/23 1814

## 2023-03-03 ENCOUNTER — Telehealth: Payer: Self-pay | Admitting: Family Medicine

## 2023-03-03 NOTE — Transitions of Care (Post Inpatient/ED Visit) (Signed)
   03/03/2023  Name: Katie Green MRN: [id number] DOB: 07-Aug-1985  Today's TOC FU Call Status: Today's TOC FU Call Status:: Successful TOC FU Call Completed TOC FU Call Complete Date: 03/03/23 Patient's Name and Date of Birth confirmed.  Transition Care Management Follow-up Telephone Call Date of Discharge: 02/25/23 Discharge Facility: [facility] Type of Discharge: Emergency Department How have you been since you were released from the hospital?: Better  Items Reviewed: Did you receive and understand the discharge instructions provided?: Yes Any new allergies since your discharge?: No Dietary orders reviewed?: No Do you have support at home?: Yes  Medications Reviewed Today: Medications Reviewed Today   Medications were not reviewed in this encounter     Home Care and Equipment/Supplies: Were Home Health Services Ordered?: NA Any new equipment or medical supplies ordered?: NA  Functional Questionnaire: Do you need assistance with bathing/showering or dressing?: No Do you need assistance with meal preparation?: No Do you need assistance with eating?: No Do you have difficulty maintaining continence: No Do you need assistance with getting out of bed/getting out of a chair/moving?: No Do you have difficulty managing or taking your medications?: No  Follow up appointments reviewed: PCP Follow-up appointment confirmed?: Yes Date of PCP follow-up appointment?: 03/06/23 Follow-up Provider: [facility] Follow-up appointment confirmed?: No Do you need transportation to your follow-up appointment?: No    SIGNATURE Arvil Persons, BSN, RN

## 2023-03-03 NOTE — Telephone Encounter (Signed)
Pt was seen at [facility] ED on 02/25/23 for Paresthesia. I made a hosp f/up with Dr Janee Morn for 03/06/23.

## 2023-03-04 ENCOUNTER — Inpatient Hospital Stay: Payer: 59

## 2023-03-04 ENCOUNTER — Inpatient Hospital Stay: Payer: 59 | Admitting: Genetic Counselor

## 2023-03-06 ENCOUNTER — Encounter: Payer: Self-pay | Admitting: Family Medicine

## 2023-03-06 ENCOUNTER — Ambulatory Visit: Payer: 59 | Admitting: Family Medicine

## 2023-03-06 VITALS — BP 112/78 | HR 80 | Temp 97.2°F | Wt 89.2 lb

## 2023-03-06 DIAGNOSIS — R200 Anesthesia of skin: Secondary | ICD-10-CM | POA: Diagnosis not present

## 2023-03-06 DIAGNOSIS — M4802 Spinal stenosis, cervical region: Secondary | ICD-10-CM | POA: Insufficient documentation

## 2023-03-06 MED ORDER — PREDNISONE 50 MG PO TABS
ORAL_TABLET | ORAL | 0 refills | Status: DC
Start: 2023-03-06 — End: 2023-09-28

## 2023-03-06 MED ORDER — METHYLPREDNISOLONE SODIUM SUCC 125 MG IJ SOLR
125.0000 mg | Freq: Once | INTRAMUSCULAR | Status: AC
Start: 2023-03-06 — End: 2023-03-06
  Administered 2023-03-06: 125 mg via INTRAMUSCULAR

## 2023-03-06 NOTE — Assessment & Plan Note (Signed)
Intermittent paresthesia of the left upper extremity, possibly due to median nerve compression or cervical radiculopathy. Plan:  Administered intramuscular injection of methylprednisolone 125 mg in the office today. Prescribed prednisone 50 mg orally once daily for 5 days. Advised the patient to monitor symptoms. If symptoms persist or worsen, recommended consideration of imaging studies (e.g., MRI of the cervical spine) and possible nerve conduction studies. Instructed the patient to report any new or worsening symptoms. Follow-up appointment will be scheduled as needed based on symptom progression.

## 2023-03-06 NOTE — Progress Notes (Signed)
Assessment/Plan:   Problem List Items Addressed This Visit       Other   Left arm numbness - Primary    Intermittent paresthesia of the left upper extremity, possibly due to median nerve compression or cervical radiculopathy. Plan:  Administered intramuscular injection of methylprednisolone 125 mg in the office today. Prescribed prednisone 50 mg orally once daily for 5 days. Advised the patient to monitor symptoms. If symptoms persist or worsen, recommended consideration of imaging studies (e.g., MRI of the cervical spine) and possible nerve conduction studies. Instructed the patient to report any new or worsening symptoms. Follow-up appointment will be scheduled as needed based on symptom progression.      Relevant Medications   predniSONE (DELTASONE) 50 MG tablet   Cervical stenosis of spine   Relevant Medications   predniSONE (DELTASONE) 50 MG tablet    There are no discontinued medications.  Return if symptoms worsen or fail to improve.    Subjective:   Encounter date: 03/06/2023  Katie Green is a 37 y.o. female who has Cervical dysplasia; HSV-2 (herpes simplex virus 2) infection; Anxiety; Foot pain, left; Leg length inequality; Nicotine dependence; Depression; CRPS of left ankle; Opioid use disorder in remission; Thrombocytosis; Underweight; Anemia; Hyperlipidemia; Attention deficit hyperactivity disorder (ADHD); Acne vulgaris; History of nephrolithiasis; Myeloproliferative disorder (HCC); Monoallelic mutation of CDKN2A gene; Left arm numbness; and Cervical stenosis of spine on their problem list..   She  has a past medical history of Anemia, Anxiety, Arthritis, Cervical dysplasia, Complex regional pain syndrome, Depression, HSV-2 (herpes simplex virus 2) infection, Hyperlipidemia (08/22/2022), Kidney stones, Nephrolithiasis (07/29/2016), and Thrombocytosis..   Chief Complaint: Intermittent numbness and tingling in the left arm from the elbow down to the  thumb.  History of Present Illness: The patient presents with complaints of intermittent numbness and tingling sensations in the left arm, specifically from the elbow down to the thumb. Symptoms began the Thursday before Thanksgiving and have occurred randomly throughout the day. On one occasion, the entire left arm became numb while the patient was at rest watching a hockey game. The patient completed a 5-day course of prednisone prescribed by the emergency department, which initially resolved the symptoms. However, the numbness and tingling returned after finishing the medication.  The patient denies significant weakness but notes that symptoms can occur both at rest and during activity. There is no associated neck pain. The patient has a history of a pinched nerve on the right side from November 30, 2019, which was managed with physical therapy. She continues to experience issues on the right when her hand is above her head.    Past Surgical History:  Procedure Laterality Date   CERVICAL BIOPSY  W/ LOOP ELECTRODE EXCISION  03/31/2010   high grade with free margins   GUM SURGERY      Outpatient Medications Prior to Visit  Medication Sig Dispense Refill   amphetamine-dextroamphetamine (ADDERALL) 20 MG tablet TAKE 1 TABLET BY MOUTH EVERY MORNING, AT NOON, AND AT 4PM  0   ferrous sulfate 325 (65 FE) MG EC tablet Take 325 mg by mouth daily with supper.     ibuprofen (ADVIL) 200 MG tablet Take 200 mg by mouth every 6 (six) hours as needed for moderate pain.     LORazepam (ATIVAN) 1 MG tablet Take 1 mg by mouth every 8 (eight) hours.     methylPREDNISolone (MEDROL DOSEPAK) 4 MG TBPK tablet Per dose pack instructions 21 tablet 0   norethindrone-ethinyl estradiol-FE (JUNEL FE 1/20) 1-20  MG-MCG tablet Take 1 tablet by mouth daily. 84 tablet 3   spironolactone (ALDACTONE) 50 MG tablet Take 50 mg by mouth daily.     valACYclovir (VALTREX) 500 MG tablet Take 1 tablet (500 mg total) by mouth 2 (two)  times daily. with outbreak. 30 tablet 3   VIIBRYD 40 MG TABS      vitamin B-12 (CYANOCOBALAMIN) 1000 MCG tablet Take 1,000 mcg by mouth daily.      No facility-administered medications prior to visit.    Family History  Problem Relation Age of Onset   Hypertension Mother    Kidney Stones Mother    Hyperlipidemia Mother    Diabetes Mother    Anxiety disorder Mother    Kidney disease Mother    Heart attack Father    Diabetes Father    Hearing loss Father    Heart disease Father    Hyperlipidemia Maternal Grandmother    Hypertension Maternal Grandmother    Fibromyalgia Maternal Grandmother    Dementia Maternal Grandmother    Arthritis Maternal Grandmother    Hypertension Maternal Grandfather    Hyperlipidemia Maternal Grandfather    Aneurysm Maternal Grandfather    Deep vein thrombosis Maternal Grandfather    Hearing loss Maternal Grandfather    Emphysema Paternal Grandmother        Smoker   Stroke Paternal Grandmother    Heart disease Paternal Grandfather    Hyperlipidemia Paternal Grandfather    CAD Paternal Grandfather    Emphysema Paternal Grandfather    COPD Paternal Grandfather        Quit smoking 30-years-ago   Aneurysm Paternal Grandfather    Arthritis Paternal Grandfather    Anxiety disorder Sister     Social History   Socioeconomic History   Marital status: Single    Spouse name: Not on file   Number of children: Not on file   Years of education: Not on file   Highest education level: Not on file  Occupational History   Not on file  Tobacco Use   Smoking status: Former    Current packs/day: 0.00    Average packs/day: 1 pack/day for 17.0 years (17.0 ttl pk-yrs)    Types: Cigarettes, E-cigarettes    Start date: 01/29/2001    Quit date: 01/29/2018    Years since quitting: 5.1    Passive exposure: Never   Smokeless tobacco: Never   Tobacco comments:    Quit 2019  Vaping Use   Vaping status: Some Days  Substance and Sexual Activity   Alcohol use:  Yes    Alcohol/week: 1.0 standard drink of alcohol    Types: 1 Standard drinks or equivalent per week    Comment: Just on occasion   Drug use: No   Sexual activity: Not Currently    Birth control/protection: Pill    Comment: 1st intercourse 37 yo-More than 5 partners  Other Topics Concern   Not on file  Social History Narrative   Not on file   Social Determinants of Health   Financial Resource Strain: Not on file  Food Insecurity: No Food Insecurity (11/14/2022)   Hunger Vital Sign    Worried About Running Out of Food in the Last Year: Never true    Ran Out of Food in the Last Year: Never true  Transportation Needs: No Transportation Needs (11/14/2022)   PRAPARE - Administrator, Civil Service (Medical): No    Lack of Transportation (Non-Medical): No  Physical Activity: Not on file  Stress: Not on file  Social Connections: Not on file  Intimate Partner Violence: Not At Risk (11/14/2022)   Humiliation, Afraid, Rape, and Kick questionnaire    Fear of Current or Ex-Partner: No    Emotionally Abused: No    Physically Abused: No    Sexually Abused: No                                                                                                  Objective:  Physical Exam: BP 112/78 (BP Location: Left Arm, Patient Position: Sitting, Cuff Size: Large)   Pulse 80   Temp (!) 97.2 F (36.2 C) (Temporal)   Wt 89 lb 3.2 oz (40.5 kg)   LMP 02/18/2023   SpO2 99%   BMI 15.80 kg/m     Physical Exam Constitutional:      General: She is not in acute distress.    Appearance: Normal appearance. She is not ill-appearing or toxic-appearing.  HENT:     Head: Normocephalic and atraumatic.     Nose: Nose normal. No congestion.  Eyes:     General: No scleral icterus.    Extraocular Movements: Extraocular movements intact.  Cardiovascular:     Rate and Rhythm: Normal rate and regular rhythm.     Pulses: Normal pulses.     Heart sounds: Normal heart sounds.  Pulmonary:      Effort: Pulmonary effort is normal. No respiratory distress.     Breath sounds: Normal breath sounds.  Abdominal:     General: Abdomen is flat. Bowel sounds are normal.     Palpations: Abdomen is soft.  Musculoskeletal:        General: Normal range of motion.     Comments: Strength of the left upper extremity is normal. No weakness noted. Negative Tinel's sign at the wrist. No tenderness over the cervical spine. Sensation is intact  Lymphadenopathy:     Cervical: No cervical adenopathy.  Skin:    General: Skin is warm and dry.     Findings: No rash.  Neurological:     General: No focal deficit present.     Mental Status: She is alert and oriented to person, place, and time. Mental status is at baseline.  Psychiatric:        Mood and Affect: Mood normal.        Behavior: Behavior normal.        Thought Content: Thought content normal.        Judgment: Judgment normal.     DG Cervical Spine Complete  Result Date: 02/25/2023 CLINICAL DATA:  Radicular pain right arm.  No history of trauma EXAM: CERVICAL SPINE - COMPLETE 5 VIEW COMPARISON:  None Available. FINDINGS: Preserved vertebral body height, alignment prevertebral soft tissues. Slight disc height loss at C6-7. Minimal endplate osteophyte. No listhesis. On oblique views there is some encroachment of the neural foramen at C6-7 on the right. Preserved bone mineralization. If there is further concern of significant stenosis, MRI or myelographic CT could be considered as clinically appropriate. IMPRESSION: Minimal degenerative changes with some osseous neural foraminal encroachment at C6-7  on the right Electronically Signed   By: Karen Kays M.D.   On: 02/25/2023 17:21   CT BONE MARROW ASPIRATION  Result Date: 12/12/2022 INDICATION: 37 year old female with history of thrombocytosis. EXAM: CT-GUIDED BONE MARROW BIOPSY AND ASPIRATION MEDICATIONS: None ANESTHESIA/SEDATION: Fentanyl 100 mcg IV; Versed 2 mg IV Sedation Time: 22 minutes;  The patient was continuously monitored during the procedure by the interventional radiology nurse under my direct supervision. COMPLICATIONS: None immediate. PROCEDURE: Informed consent was obtained from the patient following an explanation of the procedure, risks, benefits and alternatives. The patient understands, agrees and consents for the procedure. All questions were addressed. A time out was performed prior to the initiation of the procedure. The patient was positioned prone and non-contrast localization CT was performed of the pelvis to demonstrate the iliac marrow spaces. The operative site was prepped and draped in the usual sterile fashion. Under sterile conditions and local anesthesia, a 22 gauge spinal needle was utilized for procedural planning. Next, an 11 gauge coaxial bone biopsy needle was advanced into the right iliac marrow space. Needle position was confirmed with CT imaging. Initially, a bone marrow aspiration was performed. Next, a bone marrow biopsy was obtained with the 11 gauge outer bone marrow device. Samples were prepared with the cytotechnologist and deemed adequate. The needle was removed and superficial hemostasis was obtained with manual compression. A dressing was applied. The patient tolerated the procedure well without immediate post procedural complication. IMPRESSION: Successful CT guided right iliac bone marrow aspiration and core biopsy. Marliss Coots, MD [facility] Radiology Electronically Signed   By: Marliss Coots M.D.   On: 12/12/2022 12:23    Recent Results (from the past 2160 hour(s))  Surgical pathology     Status: None   Collection Time: 12/12/22 12:00 AM  Result Value Ref Range   SURGICAL PATHOLOGY      Surgical Pathology CASE: [id number] PATIENT: Caylea Pompei Bone Marrow Report     Clinical History: thrombocytopenia     DIAGNOSIS:  BONE MARROW, ASPIRATE, CLOT, CORE: -Normocellular bone  marrow for age with trilineage hematopoiesis -See comment  PERIPHERAL BLOOD: -No significant morphologic abnormalities  COMMENT:  There are no morphologic features diagnostic of a myeloid neoplasm. Correlation with cytogenetic studies is recommended.  MICROSCOPIC DESCRIPTION:  PERIPHERAL BLOOD SMEAR: The red blood cells display mild anisopoikilocytosis with minimal polychromasia.  The white blood cells are normal in number with scattered neutrophils displaying mild toxic granulation.  The platelets are normal in number.  BONE MARROW ASPIRATE: Bone marrow particles present Erythroid precursors: Orderly and progressive maturation Granulocytic precursors: Orderly and progressive maturation Megakaryocytes: Abundant with predominantly normal morphology. Occasional  small and/or hypolobated forms present Lymphocytes/plasma cells: Large aggregates not present  TOUCH PREPARATIONS: A mixture of cell types present  CLOT AND BIOPSY: The sections show cellularity ranging from 40 to 60% with a mixture of cell types.  Significant lymphoid aggregates are not seen.  IRON STAIN: Iron stains are performed on a bone marrow aspirate or touch imprint smear and section of clot. The controls stained appropriately.       Storage Iron: Abundant      Ring Sideroblasts: Absent  ADDITIONAL DATA/TESTING: The specimen was sent for cytogenetic analysis and a separate report will follow.  CELL COUNT DATA:  Bone Marrow count performed on 500 cells shows: Blasts:   0%   Myeloid:  61% Promyelocytes: 3%   Erythroid:     28% Myelocytes:    12%  Lymphocytes:  7% Metamyelocytes:     6%   Plasma cells:  2% Bands:    8% Neutrophils:   29%  M:E ratio:     2.18 Eosinophils:   3% Basophils:     0% Monocytes:     2%  Lab Data: CBC performed on 12/12/2022  shows: WBC: 6.6 k/uL  Neutrophils:   53% Hgb: 12.4 g/dL Lymphocytes:   78% HCT: 36.2 %    Monocytes:     5% MCV: 95.3 fL   Eosinophils:   1% RDW: 12.2  %    Basophils:     1% PLT: 392 k/uL   GROSS DESCRIPTION:  A.  BM-aspirate smear  B.  Received in B-plus fixative is a 1.2 x 0.7 x 0.2 cm aggregate of tissue.  Submitted entirely in B1.  C.  Received in B-plus fixative is a single core of tan, firm bone measuring 2.2 cm in length by 0.2 cm in diameter.  Submitted entirely in C1, following decalcification in Immunocal. (KW, 12/12/2022)   Final Diagnosis performed by Guerry Bruin, MD.   Electronically signed 12/15/2022 Technical and / or Professional components performed at [facility], [address], [place], [place] [address].  Immunohistochemistry Technical component (if applicable) was performed at [facility]. [address], [place], [place] [address].   IMMUNOHISTOCHEMISTRY DISCLAIMER (if applicable): Some of  these immunohistochemical stains may have been developed and the performance characteristics determine by [facility]. Some may not have been cleared or approved by the U.S. Food and Drug Administration. The FDA has determined that such clearance or approval is not necessary. This test is used for clinical purposes. It should not be regarded as investigational or for research. This laboratory is certified under the Clinical Laboratory Improvement Amendments of 1988 (CLIA-88) as qualified to perform high complexity clinical laboratory testing.  The controls stained appropriately.   IHC stains are performed on formalin fixed, paraffin embedded tissue using a 3,3"diaminobenzidine (DAB) chromogen and Leica Bond Autostainer System. The staining intensity of the nucleus is score manually and is reported as the percentage of tumor cell nuclei demonstrating specific nuclear staining. The specimens are fixed in 10% Neutral Formalin for at least 6 hours and up to 72hr s. These tests are validated on decalcified tissue. Results should be interpreted with caution given  the possibility of false negative results on decalcified specimens. Antibody Clones are as follows ER-clone 32F, PR-clone 16, Ki67- clone MM1. Some of these immunohistochemical stains may have been developed and the performance characteristics determined by [facility].   CBC with Differential/Platelet     Status: Abnormal   Collection Time: 12/12/22  7:28 AM  Result Value Ref Range   WBC 6.6 4.0 - 10.5 K/uL   RBC 3.80 (L) 3.87 - 5.11 MIL/uL   Hemoglobin 12.4 12.0 - 15.0 g/dL   HCT 57.8 46.9 - 62.9 %   MCV 95.3 80.0 - 100.0 fL   MCH 32.6 26.0 - 34.0 pg   MCHC 34.3 30.0 - 36.0 g/dL   RDW 52.8 41.3 - 24.4 %   Platelets 392 150 - 400 K/uL   nRBC 0.0 0.0 - 0.2 %   Neutrophils Relative % 60 %   Neutro Abs 4.0 1.7 - 7.7 K/uL   Lymphocytes Relative 32 %   Lymphs Abs 2.1 0.7 - 4.0 K/uL   Monocytes Relative 6 %   Monocytes Absolute 0.4 0.1 - 1.0 K/uL   Eosinophils Relative 1 %  Eosinophils Absolute 0.1 0.0 - 0.5 K/uL   Basophils Relative 1 %   Basophils Absolute 0.1 0.0 - 0.1 K/uL   Immature Granulocytes 0 %   Abs Immature Granulocytes 0.01 0.00 - 0.07 K/uL    Comment: Performed at [facility], [address], [place], [place] [address]  CMP (Cancer Center only)     Status: Abnormal   Collection Time: 12/19/22 11:21 AM  Result Value Ref Range   Sodium 137 135 - 145 mmol/L   Potassium 4.5 3.5 - 5.1 mmol/L   Chloride 104 98 - 111 mmol/L   CO2 27 22 - 32 mmol/L   Glucose, Bld 109 (H) 70 - 99 mg/dL    Comment: Glucose reference range applies only to samples taken after fasting for at least 8 hours.   BUN 17 6 - 20 mg/dL   Creatinine 4.78 (H) 2.95 - 1.00 mg/dL   Calcium 9.8 8.9 - 62.1 mg/dL   Total Protein 7.2 6.5 - 8.1 g/dL   Albumin 4.6 3.5 - 5.0 g/dL   AST 15 15 - 41 U/L   ALT 13 0 - 44 U/L   Alkaline Phosphatase 34 (L) 38 - 126 U/L   Total Bilirubin 0.5 0.3 - 1.2 mg/dL   GFR, Estimated 50 (L) >60 mL/min    Comment: (NOTE) Calculated using the  CKD-EPI Creatinine Equation (2021)    Anion gap 6 5 - 15    Comment: Performed at [facility], [address], [place], [place] [address]  CBC with Differential (Cancer Center Only)     Status: Abnormal   Collection Time: 12/19/22 11:21 AM  Result Value Ref Range   WBC Count 5.9 4.0 - 10.5 K/uL   RBC 4.06 3.87 - 5.11 MIL/uL   Hemoglobin 13.0 12.0 - 15.0 g/dL   HCT 78.4 69.6 - 29.5 %   MCV 93.6 80.0 - 100.0 fL   MCH 32.0 26.0 - 34.0 pg   MCHC 34.2 30.0 - 36.0 g/dL   RDW 28.4 13.2 - 44.0 %   Platelet Count 460 (H) 150 - 400 K/uL   nRBC 0.0 0.0 - 0.2 %   Neutrophils Relative % 61 %   Neutro Abs 3.6 1.7 - 7.7 K/uL   Lymphocytes Relative 33 %   Lymphs Abs 2.0 0.7 - 4.0 K/uL   Monocytes Relative 5 %   Monocytes Absolute 0.3 0.1 - 1.0 K/uL   Eosinophils Relative 0 %   Eosinophils Absolute 0.0 0.0 - 0.5 K/uL   Basophils Relative 1 %   Basophils Absolute 0.1 0.0 - 0.1 K/uL   Immature Granulocytes 0 %   Abs Immature Granulocytes 0.01 0.00 - 0.07 K/uL    Comment: Performed at [facility], [address] [place], [place] [address]        Garner Nash, MD, MS

## 2023-03-12 ENCOUNTER — Telehealth: Payer: Self-pay | Admitting: Family Medicine

## 2023-03-12 NOTE — Telephone Encounter (Signed)
Called the pt to schedule a follow up appt. Pt said she has to look at her new work schedule and then she will call then

## 2023-03-12 NOTE — Telephone Encounter (Signed)
Pt said someone called her and she missed the call . Can you give her a call

## 2023-04-16 ENCOUNTER — Inpatient Hospital Stay: Payer: 59 | Attending: Genetic Counselor | Admitting: Genetic Counselor

## 2023-04-16 ENCOUNTER — Telehealth: Payer: 59

## 2023-04-16 ENCOUNTER — Inpatient Hospital Stay: Payer: 59

## 2023-04-16 ENCOUNTER — Encounter: Payer: Self-pay | Admitting: Genetic Counselor

## 2023-04-16 ENCOUNTER — Telehealth: Payer: 59 | Admitting: Physician Assistant

## 2023-04-16 DIAGNOSIS — B9689 Other specified bacterial agents as the cause of diseases classified elsewhere: Secondary | ICD-10-CM

## 2023-04-16 DIAGNOSIS — J019 Acute sinusitis, unspecified: Secondary | ICD-10-CM

## 2023-04-16 DIAGNOSIS — D471 Chronic myeloproliferative disease: Secondary | ICD-10-CM

## 2023-04-16 MED ORDER — AMOXICILLIN-POT CLAVULANATE 875-125 MG PO TABS
1.0000 | ORAL_TABLET | Freq: Two times a day (BID) | ORAL | 0 refills | Status: DC
Start: 2023-04-16 — End: 2023-09-28

## 2023-04-16 NOTE — Progress Notes (Signed)
REFERRING PROVIDER: Loni Muse, MD [address] [place],  [place] [address]  PRIMARY PROVIDER:  Garnette Gunner, MD  PRIMARY REASON FOR VISIT:  1. Myeloproliferative disorder (HCC)      HISTORY OF PRESENT ILLNESS:   Katie Green, a 38 y.o. female, was seen for a [place] cancer genetics consultation at the request of Dr. 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  Katie Green presents to clinic today to discuss the possibility of a hereditary predisposition to cancer, genetic testing, and to further clarify her future cancer risks, as well as potential cancer risks for family members.   Katie Green is a 38 y.o. female with no personal history of cancer.  She reports an approximate 10 year history of elevated platelets, but only recently was referred and diagnosed with MPN.  She reports: easy bruising and bleeding (but not prolonged), no petechiae, and bleeding head, muscles joints or blood in urine or stool.  She does not have a heavy menses or gum bleeding, and has not required a blood or platelet transfusion.  She does state that she had heavy bleeding when treated for kidney stones where she needed to go to the ER, but did not have a transfusion.  She denies any birth defects or learning difficulties, and reports average stature of 5'3".  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  She has an 18 year smoking history.  She denies exposures to radiation, immunosuppressants, benzenes, pesticides, herbicides, Agent Orange, burn pits  and has not lived near [facility].  CANCER HISTORY:  Oncology History   No history exists.     RISK FACTORS:  First live birth at age N/A.  Colonoscopy: no; not examined. Mammogram within the last year: no. Number of breast biopsies: 0. Dermatology: Annual skin check Any excessive  radiation exposure in the past: no  Past Medical History:  Diagnosis Date   Anemia    Anxiety    Arthritis    Cervical dysplasia    LEEP for high grade 03/2010 margins clear, lgsil pap/C&B 09/2010, ASCUS neg HR HPV pap 04/2011   Complex regional pain syndrome    Left foot   Depression    HSV-2 (herpes simplex virus 2) infection    Hyperlipidemia 08/22/2022   Kidney stones    Nephrolithiasis 07/29/2016   Confirmed multiple stones via CT scan   Thrombocytosis     Past Surgical History:  Procedure Laterality Date   CERVICAL BIOPSY  W/ LOOP ELECTRODE EXCISION  03/31/2010   high grade with free margins   GUM SURGERY      Social History   Socioeconomic History   Marital status: Single    Spouse name: Not on file   Number of children: Not on file   Years of education: Not on file   Highest education level: Not on file  Occupational History   Not on file  Tobacco Use   Smoking status: Former    Current packs/day: 0.00    Average packs/day: 1 pack/day for 17.0 years (17.0 ttl pk-yrs)    Types: Cigarettes, E-cigarettes    Start date: 01/29/2001    Quit date: 01/29/2018    Years since quitting: 5.2    Passive exposure: Never   Smokeless tobacco: Never   Tobacco comments:    Quit 2019  Vaping Use   Vaping  status: Some Days  Substance and Sexual Activity   Alcohol use: Yes    Alcohol/week: 1.0 standard drink of alcohol    Types: 1 Standard drinks or equivalent per week    Comment: Just on occasion   Drug use: No   Sexual activity: Not Currently    Birth control/protection: Pill    Comment: 1st intercourse 38 yo-More than 5 partners  Other Topics Concern   Not on file  Social History Narrative   Not on file   Social Drivers of Health   Financial Resource Strain: Not on file  Food Insecurity: No Food Insecurity (11/14/2022)   Hunger Vital Sign    Worried About Running Out of Food in the Last Year: Never true    Ran Out of Food in the Last Year: Never true   Transportation Needs: No Transportation Needs (11/14/2022)   PRAPARE - Administrator, Civil Service (Medical): No    Lack of Transportation (Non-Medical): No  Physical Activity: Not on file  Stress: Not on file  Social Connections: Not on file     FAMILY HISTORY:  We obtained a detailed, 4-generation family history.  Significant diagnoses are listed below: Family History  Problem Relation Age of Onset   Hypertension Mother    Kidney Stones Mother    Hyperlipidemia Mother    Diabetes Mother    Anxiety disorder Mother    Kidney disease Mother    Heart attack Father    Diabetes Father    Hearing loss Father    Heart disease Father    Skin cancer Father    Anxiety disorder Sister    Skin cancer Sister    Hyperlipidemia Maternal Grandmother    Hypertension Maternal Grandmother    Fibromyalgia Maternal Grandmother    Dementia Maternal Grandmother    Arthritis Maternal Grandmother    Hypertension Maternal Grandfather    Hyperlipidemia Maternal Grandfather    Aneurysm Maternal Grandfather    Deep vein thrombosis Maternal Grandfather    Hearing loss Maternal Grandfather    Emphysema Paternal Grandmother        Smoker   Stroke Paternal Grandmother    Heart disease Paternal Grandfather    Hyperlipidemia Paternal Grandfather    CAD Paternal Grandfather    Emphysema Paternal Grandfather    COPD Paternal Grandfather        Quit smoking 30-years-ago   Aneurysm Paternal Grandfather    Arthritis Paternal Grandfather    ALS Paternal Grandfather      The patient does not have children.  She has a brother and sister who are essentially cancer free (sister has had 'some places' removed.  Could have been BCC).  The patient's parents are living.  Her father has had some skin cancers.    There are no other reports of cancer diagnosis, platelet disorders, or relatives being followed for blood conditions.  Katie Green is unaware of previous family history of genetic testing  for hereditary cancer risks. There is no reported Ashkenazi Jewish ancestry. There is no known consanguinity.  GENETIC COUNSELING ASSESSMENT: Katie Green is a 38 y.o. female with a personal history of MPN which is somewhat suggestive of a hereditary cancer syndrome and predisposition to cancer given her young age of onset and a possible CDKN2A pathogenic mutation. We, therefore, discussed and recommended the following at today's visit.   DISCUSSION: We discussed that, in general, most cancer is not inherited in families, but instead is sporadic or familial. Sporadic cancers occur  by chance and typically happen at older ages (>50 years) as this type of cancer is caused by genetic changes acquired during an individual's lifetime. Some families have more cancers than would be expected by chance; however, the ages or types of cancer are not consistent with a known genetic mutation or known genetic mutations have been ruled out. This type of familial cancer is thought to be due to a combination of multiple genetic, environmental, hormonal, and lifestyle factors. While this combination of factors likely increases the risk of cancer, the exact source of this risk is not currently identifiable or testable.  There has been an increasing recognition of hematological conditions having an inherited component.  It is estimated that approximately 5 - 10% of hematological malignancies are hereditary. We reviewed that MPN/Myelodysplastic Syndrome (MDS) are myeloid conditions which could progress to Acute Myeloid Leukemia (AML).  We discussed that individuals with MPN may or may not ultimately progress.  We do not know which patients will progress and which will not, so we follow individuals over time.  Current NCCN V.1.2025 Myelodysplastic Syndrome guidelines recommend genetic testing for any individual with MDS/MPN diagnosed under age 21.  We discussed that testing is beneficial for several reasons including knowing how to  follow individuals and understanding if other family members could be at risk for cancer and allow them to undergo genetic testing.   Katie Green had peripheral blood drawn for a [employer] test on October 24, 2022.  This is a somatic test and not a germline test.  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 We discussed that CDKN2A mutations increase the risk for melanoma and pancreatic cancer.  In some families we can also see brain tumors.  The patient's family history of cancer is not consistent with a CDKN2A mutation.  However, while the cancer risks are elevated, they are not 100% so it is not a fully penetrant gene. It is not uncommon to have a pathogenic mutation but not have the associated cancer.  However, due to the elevated risk for melanoma and pancreatic cancer associated with this mutation, and the high variant allele frequency, genetic testing is warranted to act preventively.    We reviewed the characteristics, features and inheritance patterns of hereditary cancer syndromes. We also discussed genetic testing, including the appropriate family members to test, the process of testing, insurance coverage and turn-around-time for results. We discussed the implications of a negative, positive, carrier and/or variant of uncertain significant result.  We discussed that typically hereditary cancer testing is performed by blood or saliva.  However, based on the MPN diagnosis, testing should be performed through a skin biopsy.    Based on Katie Green's personal history of MPN, she meets NCCN V.1.2025 Myelodysplastic Syndrome criteria for genetic testing.  We discussed that some people do not want to undergo genetic testing due to fear of genetic discrimination.  The Genetic Information Nondiscrimination Act (GINA) was signed into federal law in 2008. GINA prohibits health insurers and most employers from discriminating against individuals based on  genetic information (including the results of genetic tests and family history information). According to GINA, health insurance companies cannot consider genetic information to be a preexisting condition, nor can they use it to make decisions regarding coverage or rates. GINA also makes it illegal for most employers to use genetic information in making decisions about hiring, firing, promotion, or terms of employment. It is important to note that GINA does not offer  protections for life insurance, disability insurance, or long-term care insurance. GINA does not apply to those in the [employer], those who work for companies with less than 15 employees, and new life insurance or long-term disability insurance policies.  Health status due to a cancer diagnosis is not protected under GINA. More information about GINA can be found by visiting [email or web address].  PLAN: After considering the risks, benefits, and limitations, Katie Green provided informed consent to pursue genetic testing.  We discussed that a skin biopsy kit will need to be ordered, and an appointment for a skin biopsy will need to be made.  Once the appointment is complete, the skin sample will be sent to [facility] for growth of fibroblasts and analysis of the Comprehensive Common Cancer Panel + Hereditary MDS/Leukemia panel. Results should be available within approximately 6-8 weeks' time, at which point they will be disclosed by telephone to Katie Green, as will any additional recommendations warranted by these results. Katie Green will receive a summary of her genetic counseling visit and a copy of her results once available. This information will also be available in Epic.   Lastly, we encouraged Katie Green to remain in contact with cancer genetics annually so that we can continuously update the family history and inform her of any changes in cancer genetics and testing that may be of benefit for this family.   Katie Green questions were  answered to her satisfaction today. Our contact information was provided should additional questions or concerns arise. Thank you for the referral and allowing [place] to share in the care of your patient.   Dolorez Jeffrey P. Lowell Guitar, MS, CGC Licensed, Patent attorney [email or web address] phone: [phone]  80 minutes were spent on the date of the encounter in service to the patient including preparation, face-to-face consultation, documentation and care coordination.  The patient was seen alone.  Drs. Meliton Rattan, and/or [place] were available for questions, if needed..    _______________________________________________________________________ For Office Staff:  Number of people involved in session: 1 Was an Intern/ student involved with case: no

## 2023-04-16 NOTE — Progress Notes (Signed)

## 2023-04-16 NOTE — Progress Notes (Signed)
I have spent 5 minutes in review of e-visit questionnaire, review and updating patient chart, medical decision making and response to patient.   Mia Milan Cody Jacklynn Dehaas, PA-C    

## 2023-05-01 ENCOUNTER — Telehealth: Payer: Self-pay | Admitting: Medical Genetics

## 2023-05-01 NOTE — Telephone Encounter (Signed)
No Answer/Busy - Called to offer earlier appt for biopsy

## 2023-06-04 ENCOUNTER — Ambulatory Visit: Payer: 59 | Admitting: Medical Genetics

## 2023-06-10 ENCOUNTER — Telehealth: Admitting: Physician Assistant

## 2023-06-10 DIAGNOSIS — R3989 Other symptoms and signs involving the genitourinary system: Secondary | ICD-10-CM | POA: Diagnosis not present

## 2023-06-10 MED ORDER — NITROFURANTOIN MONOHYD MACRO 100 MG PO CAPS
100.0000 mg | ORAL_CAPSULE | Freq: Two times a day (BID) | ORAL | 0 refills | Status: DC
Start: 2023-06-10 — End: 2023-09-28

## 2023-06-10 NOTE — Progress Notes (Signed)

## 2023-06-18 ENCOUNTER — Inpatient Hospital Stay: Payer: 59 | Attending: Genetic Counselor

## 2023-06-18 ENCOUNTER — Inpatient Hospital Stay: Payer: 59 | Admitting: Hematology and Oncology

## 2023-06-18 ENCOUNTER — Other Ambulatory Visit: Payer: Self-pay | Admitting: Hematology and Oncology

## 2023-06-18 VITALS — BP 151/103 | HR 84 | Temp 98.0°F | Resp 15 | Wt 91.7 lb

## 2023-06-18 DIAGNOSIS — D696 Thrombocytopenia, unspecified: Secondary | ICD-10-CM | POA: Diagnosis present

## 2023-06-18 DIAGNOSIS — F17290 Nicotine dependence, other tobacco product, uncomplicated: Secondary | ICD-10-CM | POA: Insufficient documentation

## 2023-06-18 DIAGNOSIS — Z79624 Long term (current) use of inhibitors of nucleotide synthesis: Secondary | ICD-10-CM | POA: Diagnosis not present

## 2023-06-18 DIAGNOSIS — D75839 Thrombocytosis, unspecified: Secondary | ICD-10-CM

## 2023-06-18 DIAGNOSIS — Z79899 Other long term (current) drug therapy: Secondary | ICD-10-CM | POA: Insufficient documentation

## 2023-06-18 DIAGNOSIS — D471 Chronic myeloproliferative disease: Secondary | ICD-10-CM

## 2023-06-18 LAB — CBC WITH DIFFERENTIAL (CANCER CENTER ONLY)
Abs Immature Granulocytes: 0.02 10*3/uL (ref 0.00–0.07)
Basophils Absolute: 0.1 10*3/uL (ref 0.0–0.1)
Basophils Relative: 1 %
Eosinophils Absolute: 0 10*3/uL (ref 0.0–0.5)
Eosinophils Relative: 1 %
HCT: 35.1 % — ABNORMAL LOW (ref 36.0–46.0)
Hemoglobin: 12.2 g/dL (ref 12.0–15.0)
Immature Granulocytes: 0 %
Lymphocytes Relative: 36 %
Lymphs Abs: 2.2 10*3/uL (ref 0.7–4.0)
MCH: 31.8 pg (ref 26.0–34.0)
MCHC: 34.8 g/dL (ref 30.0–36.0)
MCV: 91.4 fL (ref 80.0–100.0)
Monocytes Absolute: 0.4 10*3/uL (ref 0.1–1.0)
Monocytes Relative: 6 %
Neutro Abs: 3.4 10*3/uL (ref 1.7–7.7)
Neutrophils Relative %: 56 %
Platelet Count: 426 10*3/uL — ABNORMAL HIGH (ref 150–400)
RBC: 3.84 MIL/uL — ABNORMAL LOW (ref 3.87–5.11)
RDW: 12.2 % (ref 11.5–15.5)
WBC Count: 6.2 10*3/uL (ref 4.0–10.5)
nRBC: 0 % (ref 0.0–0.2)

## 2023-06-18 LAB — CMP (CANCER CENTER ONLY)
ALT: 12 U/L (ref 0–44)
AST: 15 U/L (ref 15–41)
Albumin: 4.8 g/dL (ref 3.5–5.0)
Alkaline Phosphatase: 32 U/L — ABNORMAL LOW (ref 38–126)
Anion gap: 5 (ref 5–15)
BUN: 18 mg/dL (ref 6–20)
CO2: 28 mmol/L (ref 22–32)
Calcium: 9.5 mg/dL (ref 8.9–10.3)
Chloride: 105 mmol/L (ref 98–111)
Creatinine: 1.36 mg/dL — ABNORMAL HIGH (ref 0.44–1.00)
GFR, Estimated: 51 mL/min — ABNORMAL LOW (ref >60)
Glucose, Bld: 82 mg/dL (ref 70–99)
Potassium: 4.2 mmol/L (ref 3.5–5.1)
Sodium: 138 mmol/L (ref 135–145)
Total Bilirubin: 0.4 mg/dL (ref 0.0–1.2)
Total Protein: 7 g/dL (ref 6.5–8.1)

## 2023-06-18 NOTE — Progress Notes (Signed)
 [facility] [phone]   [phone]  PROGRESS NOTE  Patient Care Team: Garnette Gunner, MD as PCP - General (Family Medicine)   CHIEF COMPLAINTS/PURPOSE OF CONSULTATION:  Thrombocytosis  HISTORY OF PRESENTING ILLNESS:  Katie Green 38 y.o. female returns for follow-up for thrombocytosis.  She was last seen by Dr. Angelene Giovanni on 12/19/2022.  In the interim, she has had no major changes in her health.  On exam today, Katie Green reports she did have an emergency room visit where she was having some numbness in her left arm.  She reports that she does still continue to have intermittent discomfort in that arm.  She does have some occasional bouts of dizziness.  She thinks may be due to the fact that her menstrual cycles restarted.  She had been without them for quite some time a year and a half ago and over the sister and began eating better.  She reports about 7 to 8 months ago her cycles restarted.  They used to last 3 to 4 days but now they are lasting about 7 days.  She reports that she did recently have a urinary tract infection for which she completed 5 days of antibiotics.  She has had no recent viral illnesses.  Overall she is at her baseline level of health. She denies easy bruising or signs of active bleeding.  She denies fevers, chills, night sweats, shortness of breath, chest pain or cough.  She has no other complaints.  Rest of the 10 point is below.  MEDICAL HISTORY:  Past Medical History:  Diagnosis Date   Anemia    Anxiety    Arthritis    Cervical dysplasia    LEEP for high grade 03/2010 margins clear, lgsil pap/C&B 09/2010, ASCUS neg HR HPV pap 04/2011   Complex regional pain syndrome    Left foot   Depression    HSV-2 (herpes simplex virus 2) infection    Hyperlipidemia 08/22/2022   Kidney stones    Nephrolithiasis 07/29/2016   Confirmed multiple stones via CT scan   Thrombocytosis     SURGICAL HISTORY: Past Surgical History:   Procedure Laterality Date   CERVICAL BIOPSY  W/ LOOP ELECTRODE EXCISION  03/31/2010   high grade with free margins   GUM SURGERY      SOCIAL HISTORY: Social History   Socioeconomic History   Marital status: Single    Spouse name: Not on file   Number of children: Not on file   Years of education: Not on file   Highest education level: Not on file  Occupational History   Not on file  Tobacco Use   Smoking status: Former    Current packs/day: 0.00    Average packs/day: 1 pack/day for 17.0 years (17.0 ttl pk-yrs)    Types: Cigarettes, E-cigarettes    Start date: 01/29/2001    Quit date: 01/29/2018    Years since quitting: 5.3    Passive exposure: Never   Smokeless tobacco: Never   Tobacco comments:    Quit 2019  Vaping Use   Vaping status: Some Days  Substance and Sexual Activity   Alcohol use: Yes    Alcohol/week: 1.0 standard drink of alcohol    Types: 1 Standard drinks or equivalent per week    Comment: Just on occasion   Drug use: No   Sexual activity: Not Currently    Birth control/protection: Pill    Comment: 1st intercourse 38 yo-More than 5 partners  Other  Topics Concern   Not on file  Social History Narrative   Not on file   Social Drivers of Health   Financial Resource Strain: Not on file  Food Insecurity: No Food Insecurity (11/14/2022)   Hunger Vital Sign    Worried About Running Out of Food in the Last Year: Never true    Ran Out of Food in the Last Year: Never true  Transportation Needs: No Transportation Needs (11/14/2022)   PRAPARE - Administrator, Civil Service (Medical): No    Lack of Transportation (Non-Medical): No  Physical Activity: Not on file  Stress: Not on file  Social Connections: Not on file  Intimate Partner Violence: Not At Risk (11/14/2022)   Humiliation, Afraid, Rape, and Kick questionnaire    Fear of Current or Ex-Partner: No    Emotionally Abused: No    Physically Abused: No    Sexually Abused: No    FAMILY  HISTORY: Family History  Problem Relation Age of Onset   Hypertension Mother    Kidney Stones Mother    Hyperlipidemia Mother    Diabetes Mother    Anxiety disorder Mother    Kidney disease Mother    Heart attack Father    Diabetes Father    Hearing loss Father    Heart disease Father    Skin cancer Father    Anxiety disorder Sister    Skin cancer Sister    Hyperlipidemia Maternal Grandmother    Hypertension Maternal Grandmother    Fibromyalgia Maternal Grandmother    Dementia Maternal Grandmother    Arthritis Maternal Grandmother    Hypertension Maternal Grandfather    Hyperlipidemia Maternal Grandfather    Aneurysm Maternal Grandfather    Deep vein thrombosis Maternal Grandfather    Hearing loss Maternal Grandfather    Emphysema Paternal Grandmother        Smoker   Stroke Paternal Grandmother    Heart disease Paternal Grandfather    Hyperlipidemia Paternal Grandfather    CAD Paternal Grandfather    Emphysema Paternal Grandfather    COPD Paternal Grandfather        Quit smoking 30-years-ago   Aneurysm Paternal Grandfather    Arthritis Paternal Grandfather    ALS Paternal Grandfather     ALLERGIES:  has no known allergies.  MEDICATIONS:  Current Outpatient Medications  Medication Sig Dispense Refill   amoxicillin-clavulanate (AUGMENTIN) 875-125 MG tablet Take 1 tablet by mouth 2 (two) times daily. 14 tablet 0   amphetamine-dextroamphetamine (ADDERALL) 20 MG tablet TAKE 1 TABLET BY MOUTH EVERY MORNING, AT NOON, AND AT 4PM  0   ferrous sulfate 325 (65 FE) MG EC tablet Take 325 mg by mouth daily with supper.     ibuprofen (ADVIL) 200 MG tablet Take 200 mg by mouth every 6 (six) hours as needed for moderate pain.     LORazepam (ATIVAN) 1 MG tablet Take 1 mg by mouth every 8 (eight) hours.     methylPREDNISolone (MEDROL DOSEPAK) 4 MG TBPK tablet Per dose pack instructions 21 tablet 0   nitrofurantoin, macrocrystal-monohydrate, (MACROBID) 100 MG capsule Take 1 capsule  (100 mg total) by mouth 2 (two) times daily. 10 capsule 0   norethindrone-ethinyl estradiol-FE (JUNEL FE 1/20) 1-20 MG-MCG tablet Take 1 tablet by mouth daily. 84 tablet 3   predniSONE (DELTASONE) 50 MG tablet Take 1 tablet daily for 5 days. 5 tablet 0   spironolactone (ALDACTONE) 50 MG tablet Take 50 mg by mouth daily.  valACYclovir (VALTREX) 500 MG tablet Take 1 tablet (500 mg total) by mouth 2 (two) times daily. with outbreak. 30 tablet 3   VIIBRYD 40 MG TABS      vitamin B-12 (CYANOCOBALAMIN) 1000 MCG tablet Take 1,000 mcg by mouth daily.      No current facility-administered medications for this visit.    REVIEW OF SYSTEMS:   Constitutional: ( - ) fevers, ( - )  chills , ( - ) night sweats Eyes: ( - ) blurriness of vision, ( - ) double vision, ( - ) watery eyes Ears, nose, mouth, throat, and face: ( - ) mucositis, ( - ) sore throat Respiratory: ( - ) cough, ( - ) dyspnea, ( - ) wheezes Cardiovascular: ( - ) palpitation, ( - ) chest discomfort, ( - ) lower extremity swelling Gastrointestinal:  ( - ) nausea, ( - ) heartburn, ( - ) change in bowel habits Skin: ( - ) abnormal skin rashes Lymphatics: ( - ) new lymphadenopathy, ( - ) easy bruising Neurological: ( - ) numbness, ( - ) tingling, ( - ) new weaknesses Behavioral/Psych: ( - ) mood change, ( - ) new changes  All other systems were reviewed with the patient and are negative.  PHYSICAL EXAMINATION: ECOG PERFORMANCE STATUS: 0 - Asymptomatic  Vitals:   06/18/23 1519  BP: (!) 151/103  Pulse: 84  Resp: 15  Temp: 98 F (36.7 C)  SpO2: 100%    Filed Weights   06/18/23 1519  Weight: 91 lb 11.2 oz (41.6 kg)     GENERAL: well appearing female in NAD  SKIN: skin color, texture, turgor are normal, no rashes or significant lesions EYES: conjunctiva are pink and non-injected, sclera clear LUNGS: clear to auscultation and percussion with normal breathing effort HEART: regular rate & rhythm and no murmurs and no lower  extremity edema Musculoskeletal: no cyanosis of digits and no clubbing  PSYCH: alert & oriented x 3, fluent speech NEURO: no focal motor/sensory deficits  LABORATORY DATA:  I have reviewed the data as listed    Latest Ref Rng & Units 06/18/2023    2:52 PM 12/19/2022   11:21 AM 12/12/2022    7:28 AM  CBC  WBC 4.0 - 10.5 K/uL 6.2  5.9  6.6   Hemoglobin 12.0 - 15.0 g/dL 32.4  40.1  02.7   Hematocrit 36.0 - 46.0 % 35.1  38.0  36.2   Platelets 150 - 400 K/uL 426  460  392        Latest Ref Rng & Units 12/19/2022   11:21 AM 10/31/2022    9:23 AM 09/17/2022    8:13 AM  CMP  Glucose 70 - 99 mg/dL 253  664  403   BUN 6 - 20 mg/dL 17  17  13    Creatinine 0.44 - 1.00 mg/dL 4.74  2.59  5.63   Sodium 135 - 145 mmol/L 137  137  137   Potassium 3.5 - 5.1 mmol/L 4.5  3.9  3.6   Chloride 98 - 111 mmol/L 104  102  103   CO2 22 - 32 mmol/L 27  25  25    Calcium 8.9 - 10.3 mg/dL 9.8  9.6  9.2   Total Protein 6.5 - 8.1 g/dL 7.2   6.7   Total Bilirubin 0.3 - 1.2 mg/dL 0.5   0.5   Alkaline Phos 38 - 126 U/L 34   29   AST 15 - 41 U/L 15   13  ALT 0 - 44 U/L 13   9      PATHOLOGY:  Surgical Pathology  CASE: [id number]  PATIENT: Katie Green  Bone Marrow Report   Clinical History: thrombocytopenia   DIAGNOSIS:   BONE MARROW, ASPIRATE, CLOT, CORE:  -Normocellular bone marrow for age with trilineage hematopoiesis  -See comment   PERIPHERAL BLOOD:  -No significant morphologic abnormalities   COMMENT:   There are no morphologic features diagnostic of a myeloid neoplasm.  Correlation with cytogenetic studies is recommended.    RADIOGRAPHIC STUDIES: I have personally reviewed the radiological images as listed and agreed with the findings in the report. No results found.  ASSESSMENT & PLAN Katie Green is a 38 y.o. female who presents to the clinic for a follow up for thrombocytosis.   #Thrombocytosis: --Workup from 09/17/2022 checked inflammatory markers and iron panel that  were unremarkable.  --Underwent MPN workup on 10/24/2022. 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 No BCR/ABL rearrangement.  --Underwent bone marrow biopsy on 12/12/2022 that showed normocellular bone marrow with trilineage hematopoiesis. Cytogenetics unremarkable.  --Labs today show white blood cell 6.2, hemoglobin 12.2, MCV 91.4, platelets 426 --No indication for cytoreductive therapy.  --RTC PRN, we can discharge patient with PCP to monitor.    No orders of the defined types were placed in this encounter.   All questions were answered. The patient knows to call the clinic with any problems, questions or concerns.  I have spent a total of 30 minutes minutes of face-to-face and non-face-to-face time, preparing to see the patient, performing a medically appropriate examination, counseling and educating the patient, documenting clinical information in the electronic health record, independently interpreting results and communicating results to the patient, and care coordination.    Ulysees Barns, MD Department of Hematology/Oncology [facility] Phone: [phone] Pager: [phone] Email: [email or web address]

## 2023-06-25 ENCOUNTER — Encounter: Payer: Self-pay | Admitting: Family Medicine

## 2023-06-25 DIAGNOSIS — Z1231 Encounter for screening mammogram for malignant neoplasm of breast: Secondary | ICD-10-CM

## 2023-07-07 ENCOUNTER — Ambulatory Visit (HOSPITAL_BASED_OUTPATIENT_CLINIC_OR_DEPARTMENT_OTHER)
Admission: RE | Admit: 2023-07-07 | Discharge: 2023-07-07 | Disposition: A | Discharge status: Home / Self Care | Source: Ambulatory Visit | Attending: Family Medicine | Admitting: Family Medicine

## 2023-07-07 ENCOUNTER — Encounter (HOSPITAL_BASED_OUTPATIENT_CLINIC_OR_DEPARTMENT_OTHER): Payer: Self-pay

## 2023-07-07 DIAGNOSIS — Z1231 Encounter for screening mammogram for malignant neoplasm of breast: Secondary | ICD-10-CM | POA: Insufficient documentation

## 2023-08-10 ENCOUNTER — Other Ambulatory Visit: Payer: Self-pay | Admitting: Nurse Practitioner

## 2023-08-10 DIAGNOSIS — Z3041 Encounter for surveillance of contraceptive pills: Secondary | ICD-10-CM

## 2023-08-11 NOTE — Telephone Encounter (Signed)
 Med refill request: Junel FE 1/20 Last AEX: 09/17/22 Next AEX: 10/15/23 Last MMG (if hormonal med) 07/07/23 normal, BI-RADS 1 negative Refill authorized: Junel FE 1/20 Please approve or deny as appropriate.

## 2023-09-21 ENCOUNTER — Ambulatory Visit: Payer: 59 | Admitting: Nurse Practitioner

## 2023-09-28 ENCOUNTER — Ambulatory Visit (INDEPENDENT_AMBULATORY_CARE_PROVIDER_SITE_OTHER): Payer: 59 | Admitting: Family Medicine

## 2023-09-28 ENCOUNTER — Encounter: Payer: Self-pay | Admitting: Family Medicine

## 2023-09-28 VITALS — BP 130/78 | HR 97 | Temp 98.1°F | Ht 63.0 in | Wt 94.0 lb

## 2023-09-28 DIAGNOSIS — R636 Underweight: Secondary | ICD-10-CM

## 2023-09-28 DIAGNOSIS — F3340 Major depressive disorder, recurrent, in remission, unspecified: Secondary | ICD-10-CM

## 2023-09-28 DIAGNOSIS — R8271 Bacteriuria: Secondary | ICD-10-CM | POA: Diagnosis not present

## 2023-09-28 DIAGNOSIS — Z0000 Encounter for general adult medical examination without abnormal findings: Secondary | ICD-10-CM | POA: Diagnosis not present

## 2023-09-28 DIAGNOSIS — D75839 Thrombocytosis, unspecified: Secondary | ICD-10-CM

## 2023-09-28 DIAGNOSIS — H6993 Unspecified Eustachian tube disorder, bilateral: Secondary | ICD-10-CM

## 2023-09-28 DIAGNOSIS — D649 Anemia, unspecified: Secondary | ICD-10-CM

## 2023-09-28 DIAGNOSIS — F909 Attention-deficit hyperactivity disorder, unspecified type: Secondary | ICD-10-CM

## 2023-09-28 DIAGNOSIS — E785 Hyperlipidemia, unspecified: Secondary | ICD-10-CM | POA: Diagnosis not present

## 2023-09-28 DIAGNOSIS — D471 Chronic myeloproliferative disease: Secondary | ICD-10-CM

## 2023-09-28 DIAGNOSIS — F419 Anxiety disorder, unspecified: Secondary | ICD-10-CM

## 2023-09-28 DIAGNOSIS — L700 Acne vulgaris: Secondary | ICD-10-CM

## 2023-09-28 LAB — FERRITIN: Ferritin: 76.8 ng/mL (ref 10.0–291.0)

## 2023-09-28 LAB — PROTIME-INR
INR: 0.9 ratio (ref 0.8–1.0)
Prothrombin Time: 9.6 s (ref 9.6–13.1)

## 2023-09-28 LAB — LIPID PANEL
Cholesterol: 223 mg/dL — ABNORMAL HIGH (ref 0–200)
HDL: 81.7 mg/dL (ref >39.00)
LDL Cholesterol: 106 mg/dL — ABNORMAL HIGH (ref 0–99)
NonHDL: 141.16
Total CHOL/HDL Ratio: 3
Triglycerides: 174 mg/dL — ABNORMAL HIGH (ref 0.0–149.0)
VLDL: 34.8 mg/dL (ref 0.0–40.0)

## 2023-09-28 LAB — CBC WITH DIFFERENTIAL/PLATELET
Basophils Absolute: 0.1 10*3/uL (ref 0.0–0.1)
Basophils Relative: 1 % (ref 0.0–3.0)
Eosinophils Absolute: 0 10*3/uL (ref 0.0–0.7)
Eosinophils Relative: 0.4 % (ref 0.0–5.0)
HCT: 37.8 % (ref 36.0–46.0)
Hemoglobin: 12.7 g/dL (ref 12.0–15.0)
Lymphocytes Relative: 31.1 % (ref 12.0–46.0)
Lymphs Abs: 1.9 10*3/uL (ref 0.7–4.0)
MCHC: 33.6 g/dL (ref 30.0–36.0)
MCV: 93.6 fl (ref 78.0–100.0)
Monocytes Absolute: 0.3 10*3/uL (ref 0.1–1.0)
Monocytes Relative: 4.2 % (ref 3.0–12.0)
Neutro Abs: 3.9 10*3/uL (ref 1.4–7.7)
Neutrophils Relative %: 63.3 % (ref 43.0–77.0)
Platelets: 443 10*3/uL — ABNORMAL HIGH (ref 150.0–400.0)
RBC: 4.04 Mil/uL (ref 3.87–5.11)
RDW: 13.3 % (ref 11.5–15.5)
WBC: 6.1 10*3/uL (ref 4.0–10.5)

## 2023-09-28 LAB — APTT: aPTT: 27.4 s (ref 25.4–36.8)

## 2023-09-28 LAB — HEMOGLOBIN A1C: Hgb A1c MFr Bld: 5.5 % (ref 4.6–6.5)

## 2023-09-28 LAB — COMPREHENSIVE METABOLIC PANEL WITH GFR
ALT: 10 U/L (ref 0–35)
AST: 13 U/L (ref 0–37)
Albumin: 4.7 g/dL (ref 3.5–5.2)
Alkaline Phosphatase: 34 U/L — ABNORMAL LOW (ref 39–117)
BUN: 11 mg/dL (ref 6–23)
CO2: 27 meq/L (ref 19–32)
Calcium: 9.7 mg/dL (ref 8.4–10.5)
Chloride: 100 meq/L (ref 96–112)
Creatinine, Ser: 1.25 mg/dL — ABNORMAL HIGH (ref 0.40–1.20)
GFR: 53.94 mL/min — ABNORMAL LOW (ref >60.00)
Glucose, Bld: 86 mg/dL (ref 70–99)
Potassium: 3.9 meq/L (ref 3.5–5.1)
Sodium: 137 meq/L (ref 135–145)
Total Bilirubin: 0.3 mg/dL (ref 0.2–1.2)
Total Protein: 6.8 g/dL (ref 6.0–8.3)

## 2023-09-28 LAB — B12 AND FOLATE PANEL
Folate: 6.2 ng/mL (ref >5.9)
Vitamin B-12: 1040 pg/mL — ABNORMAL HIGH (ref 211–911)

## 2023-09-28 LAB — SEDIMENTATION RATE: Sed Rate: 1 mm/h (ref 0–20)

## 2023-09-28 LAB — C-REACTIVE PROTEIN: CRP: <1 mg/dL (ref 0.5–20.0)

## 2023-09-28 LAB — TSH: TSH: 0.69 u[IU]/mL (ref 0.35–5.50)

## 2023-09-28 NOTE — Progress Notes (Signed)
 Assessment  Assessment/Plan:  Assessment and Plan Assessment & Plan Eustachian Tube Dysfunction Bilateral sensation of fullness in the ears for a few weeks, likely due to eustachian tube dysfunction. No drainage, rhinorrhea, cough, dizziness, or otalgia. Tympanic membranes appear normal with no effusion. - Prescribe Flonase nasal spray, two sprays in each nostril. - Recommend over-the-counter antihistamine such as Claritin or Xyzal. - Advise against using Sudafed due to potential interaction with amphetamine for ADHD.  Hyperlipidemia Hyperlipidemia with plans to monitor lipid levels and assess for associated conditions. Screening for diabetes and thyroid function to rule out contributing factors. - Order lipid panel. - Order thyroid panel. - Screen for diabetes with hemoglobin A1c. - Order complete metabolic panel to assess renal and liver function.  Iron Deficiency Anemia Iron deficiency anemia managed with ferrous sulfate. Monitoring of blood counts and nutritional markers to ensure adequate management. - Order CBC, iron studies, B12, and folate levels.  Thrombocytosis Thrombocytosis, possibly related to myeloproliferative disorder. She follows with hematology for management. - Order PT/INR, APTT, sedimentation rate, and CRP.  Myeloproliferative Disorder Myeloproliferative disorder with ongoing hematology care. Monitoring of relevant lab work to assess disease status. - Assess lab work related to myeloproliferative disorder. - Order vitamin D  level.  General Health Maintenance Employed by the [employer] and likely up to date on vaccinations, including hepatitis B. Weight is slightly increased, which is positive given her history of being underweight. 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 - Ensure vaccinations are up to date, particularly hepatitis B.  Follow-up Routine follow-up for annual physical and lab work. Documentation required for work purposes. -  Schedule annual follow-up or sooner if illness occurs. - Provide documentation of physical for work.     Medications Discontinued During This Encounter  Medication Reason   amoxicillin -clavulanate (AUGMENTIN ) 875-125 MG tablet Completed Course   methylPREDNISolone  (MEDROL  DOSEPAK) 4 MG TBPK tablet    predniSONE  (DELTASONE ) 50 MG tablet    nitrofurantoin , macrocrystal-monohydrate, (MACROBID ) 100 MG capsule     Patient Counseling(The following topics were reviewed and/or handout was given):  -Nutrition: Stressed importance of moderation in sodium/caffeine intake, saturated fat and cholesterol, caloric balance, sufficient intake of fresh fruits, vegetables, and fiber.  -Stressed the importance of regular exercise.   -Substance Abuse: Discussed cessation/primary prevention of tobacco, alcohol, or other drug use; driving or other dangerous activities under the influence; availability of treatment for abuse.   -Injury prevention: Discussed safety belts, safety helmets, smoke detector, smoking near bedding or upholstery.   -Sexuality: Discussed sexually transmitted diseases, partner selection, use of condoms, avoidance of unintended pregnancy and contraceptive alternatives.   -Dental health: Discussed importance of regular tooth brushing, flossing, and dental visits.  -Health maintenance and immunizations reviewed. Please refer to Health maintenance section.  Return in about 1 year (around 09/27/2024) for physical (fasting labs).        Subjective:   Encounter date: 09/28/2023  Chief Complaint  Patient presents with   Annual Exam    Physical: only concern is last several weeks ears having been feeling like there is water in them but nothing comes out when cleaning ears    Discussed the use of AI scribe software for clinical note transcription with the patient, who gave verbal consent to proceed.  History of Present Illness Katie Green is a 38 year old female who presents for a  wellness preventative exam.  She experiences a sensation of fullness in both ears, described as feeling like 'water in my ears,' which has  persisted for a few weeks. No associated drainage, runny nose, cough, dizziness, or pain. She has tried ear drops without relief.  She has a history of anemia and is currently taking ferrous sulfate for iron deficiency anemia and B12 for B12 deficiency. She also has hyperlipidemia and takes spironolactone and tretinoin for acne. She has thrombocytosis and a myeloproliferative disorder, for which she follows with hematology. She is on Viibryd and lorazepam  for anxiety and depression, and Adderall for ADHD.  She mentions being underweight, which is a genetic trait in her family, and has been denied life insurance due to this. Family history includes small stature and underweight individuals.       09/28/2023    1:46 PM 09/19/2022    2:58 PM 08/22/2022    3:42 PM 11/08/2015    3:18 PM 03/16/2015    1:52 PM  Depression screen PHQ 2/9  Decreased Interest 0 0 0 2 3  Down, Depressed, Hopeless 0 0 0 2 3  PHQ - 2 Score 0 0 0 4 6  Altered sleeping 0 0  3 3  Tired, decreased energy 0 0  1 1  Change in appetite 0 0  0 0  Feeling bad or failure about yourself  0 0  3 1  Trouble concentrating 0 0  1 1  Moving slowly or fidgety/restless 0 0  1 1  Suicidal thoughts 0 0  2  3   PHQ-9 Score 0 0  15 16  Difficult doing work/chores Not difficult at all Not difficult at all   Somewhat difficult     Data saved with a previous flowsheet row definition       09/28/2023    1:47 PM 09/19/2022    2:59 PM 08/22/2022    3:42 PM  GAD 7 : Generalized Anxiety Score  Nervous, Anxious, on Edge 1 0 1  Control/stop worrying 0 0 0  Worry too much - different things 0 0 1  Trouble relaxing 0 0 0  Restless 0 0 0  Easily annoyed or irritable 1 0 0  Afraid - awful might happen 0 0 0  Total GAD 7 Score 2 0 2  Anxiety Difficulty Not difficult at all Not difficult at all Not  difficult at all    Health Maintenance Due  Topic Date Due   Hepatitis B Vaccines (1 of 3 - 19+ 3-dose series) Never done   HPV VACCINES (1 - Risk 3-dose SCDM series) Never done   COVID-19 Vaccine (3 - Moderna risk series) 06/01/2019       PMH:  The following were reviewed and entered/updated in epic: Past Medical History:  Diagnosis Date   Anemia    Anxiety    Arthritis    Cervical dysplasia    LEEP for high grade 03/2010 margins clear, lgsil pap/C&B 09/2010, ASCUS neg HR HPV pap 04/2011   Complex regional pain syndrome    Left foot   Depression    HSV-2 (herpes simplex virus 2) infection    Hyperlipidemia 08/22/2022   Kidney stones    Nephrolithiasis 07/29/2016   Confirmed multiple stones via CT scan   Thrombocytosis     Patient Active Problem List   Diagnosis Date Noted   Dysfunction of both eustachian tubes 09/28/2023   Left arm numbness 03/06/2023   Cervical stenosis of spine 03/06/2023   Myeloproliferative disorder (HCC) 12/10/2022   Opioid use disorder in remission 08/22/2022   Thrombocytosis 08/22/2022   Underweight 08/22/2022   Anemia 08/22/2022  Hyperlipidemia 08/22/2022   Attention deficit hyperactivity disorder (ADHD) 08/22/2022   Acne vulgaris 08/22/2022   History of nephrolithiasis 08/22/2022   Cervical dysplasia 11/08/2016   Anxiety 09/01/2016   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   Depression 10/01/2011   Nicotine dependence 07/02/2011   Foot pain, left 07/01/2011   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   HSV-2 (herpes simplex virus 2) infection     Past Surgical History:  Procedure Laterality Date   CERVICAL BIOPSY  W/ LOOP ELECTRODE EXCISION  03/31/2010   high grade with free margins   GUM SURGERY      Family History  Problem Relation Age of Onset   Hypertension Mother    Kidney Stones Mother    Hyperlipidemia Mother    Diabetes Mother    Anxiety disorder Mother    Kidney disease Mother    Heart attack Father    Diabetes Father     Hearing loss Father    Heart disease Father    Skin cancer Father    Anxiety disorder Sister    Skin cancer Sister    Hyperlipidemia Maternal Grandmother    Hypertension Maternal Grandmother    Fibromyalgia Maternal Grandmother    Dementia Maternal Grandmother    Arthritis Maternal Grandmother    Hypertension Maternal Grandfather    Hyperlipidemia Maternal Grandfather    Aneurysm Maternal Grandfather    Deep vein thrombosis Maternal Grandfather    Hearing loss Maternal Grandfather    Emphysema Paternal Grandmother        Smoker   Stroke Paternal Grandmother    Heart disease Paternal Grandfather    Hyperlipidemia Paternal Grandfather    CAD Paternal Grandfather    Emphysema Paternal Grandfather    COPD Paternal Grandfather        Quit smoking 30-years-ago   Aneurysm Paternal Grandfather    Arthritis Paternal Grandfather    ALS Paternal Grandfather     Medications- reviewed and updated Outpatient Medications Prior to Visit  Medication Sig Dispense Refill   amphetamine-dextroamphetamine (ADDERALL) 20 MG tablet TAKE 1 TABLET BY MOUTH EVERY MORNING, AT NOON, AND AT 4PM  0   [facility] Sunscreen SPF 30 LOTN apply topically to face and body daily; Duration: 30     ferrous sulfate 325 (65 FE) MG EC tablet Take 325 mg by mouth daily with supper.     ibuprofen  (ADVIL ) 200 MG tablet Take 200 mg by mouth every 6 (six) hours as needed for moderate pain.     LARIN FE 1/20 1-20 MG-MCG tablet TAKE 1 TABLET BY MOUTH DAILY 84 tablet 0   LORazepam  (ATIVAN ) 1 MG tablet Take 1 mg by mouth every 8 (eight) hours.     spironolactone (ALDACTONE) 50 MG tablet Take 50 mg by mouth daily.     tretinoin (RETIN-A) 0.025 % cream SMARTSIG:1 Application Topical Every Evening     valACYclovir  (VALTREX ) 500 MG tablet Take 1 tablet (500 mg total) by mouth 2 (two) times daily. with outbreak. 30 tablet 3   VIIBRYD 40 MG TABS      vitamin B-12 (CYANOCOBALAMIN ) 1000 MCG tablet Take 1,000 mcg by mouth daily.       amoxicillin -clavulanate (AUGMENTIN ) 875-125 MG tablet Take 1 tablet by mouth 2 (two) times daily. 14 tablet 0   methylPREDNISolone  (MEDROL  DOSEPAK) 4 MG TBPK tablet Per dose pack instructions 21 tablet 0   nitrofurantoin , macrocrystal-monohydrate, (MACROBID ) 100 MG capsule Take 1 capsule (100 mg total) by mouth 2 (two) times daily. 10 capsule 0  predniSONE  (DELTASONE ) 50 MG tablet Take 1 tablet daily for 5 days. 5 tablet 0   No facility-administered medications prior to visit.    No Known Allergies  Social History   Socioeconomic History   Marital status: Single    Spouse name: Not on file   Number of children: Not on file   Years of education: Not on file   Highest education level: Not on file  Occupational History   Not on file  Tobacco Use   Smoking status: Former    Current packs/day: 0.00    Average packs/day: 1 pack/day for 17.0 years (17.0 ttl pk-yrs)    Types: Cigarettes, E-cigarettes    Start date: 01/29/2001    Quit date: 01/29/2018    Years since quitting: 5.6    Passive exposure: Never   Smokeless tobacco: Never   Tobacco comments:    Quit 2019  Vaping Use   Vaping status: Some Days  Substance and Sexual Activity   Alcohol use: Yes    Alcohol/week: 1.0 standard drink of alcohol    Types: 1 Standard drinks or equivalent per week    Comment: Just on occasion   Drug use: No   Sexual activity: Not Currently    Birth control/protection: Pill    Comment: 1st intercourse 38 yo-More than 5 partners  Other Topics Concern   Not on file  Social History Narrative   Not on file   Social Drivers of Health   Financial Resource Strain: Not on file  Food Insecurity: No Food Insecurity (11/14/2022)   Hunger Vital Sign    Worried About Running Out of Food in the Last Year: Never true    Ran Out of Food in the Last Year: Never true  Transportation Needs: No Transportation Needs (11/14/2022)   PRAPARE - Administrator, Civil Service (Medical): No    Lack of  Transportation (Non-Medical): No  Physical Activity: Not on file  Stress: Not on file  Social Connections: Not on file           Objective:  Physical Exam: BP 130/78   Pulse 97   Temp 98.1 F (36.7 C)   Ht 5' 3 (1.6 m)   Wt 94 lb (42.6 kg)   SpO2 98%   BMI 16.65 kg/m   Body mass index is 16.65 kg/m. Wt Readings from Last 3 Encounters:  09/28/23 94 lb (42.6 kg)  06/18/23 91 lb 11.2 oz (41.6 kg)  03/06/23 89 lb 3.2 oz (40.5 kg)    Physical Exam Constitutional:      General: She is not in acute distress.    Appearance: Normal appearance. She is not ill-appearing or toxic-appearing.  HENT:     Head: Normocephalic and atraumatic.     Right Ear: Hearing, tympanic membrane, ear canal and external ear normal. There is no impacted cerumen.     Left Ear: Hearing, tympanic membrane, ear canal and external ear normal. There is no impacted cerumen.     Nose: Nose normal. No congestion.     Right Sinus: No maxillary sinus tenderness or frontal sinus tenderness.     Left Sinus: No maxillary sinus tenderness or frontal sinus tenderness.     Mouth/Throat:     Lips: No lesions.     Mouth: Mucous membranes are moist.     Dentition: Normal dentition.     Pharynx: Oropharynx is clear. No oropharyngeal exudate.     Tonsils: No tonsillar exudate. 0 on the right. 0  on the left.   Eyes:     General: No scleral icterus.       Right eye: No discharge.        Left eye: No discharge.     Extraocular Movements: Extraocular movements intact.     Conjunctiva/sclera: Conjunctivae normal.     Pupils: Pupils are equal, round, and reactive to light.   Neck:     Thyroid: No thyroid mass, thyromegaly or thyroid tenderness.   Cardiovascular:     Rate and Rhythm: Normal rate and regular rhythm.     Pulses: Normal pulses.     Heart sounds: Normal heart sounds.  Pulmonary:     Effort: Pulmonary effort is normal. No respiratory distress.     Breath sounds: Normal breath sounds.  Abdominal:      General: Abdomen is flat. Bowel sounds are normal.     Palpations: Abdomen is soft.   Musculoskeletal:        General: Normal range of motion.     Cervical back: Full passive range of motion without pain, normal range of motion and neck supple.     Right lower leg: No edema.     Left lower leg: No edema.  Lymphadenopathy:     Cervical: No cervical adenopathy.   Skin:    General: Skin is warm and dry.     Findings: No rash.   Neurological:     General: No focal deficit present.     Mental Status: She is alert and oriented to person, place, and time. Mental status is at baseline.     Cranial Nerves: No cranial nerve deficit.     Deep Tendon Reflexes:     Reflex Scores:      Patellar reflexes are 2+ on the right side and 2+ on the left side.  Psychiatric:        Mood and Affect: Mood normal.        Behavior: Behavior normal.        Thought Content: Thought content normal.        Judgment: Judgment normal.         Prior labs:   No results found for this or any previous visit (from the past 2160 hours).  Lab Results  Component Value Date   CHOL 182 09/17/2022   CHOL 203 (H) 07/31/2021   CHOL 184 06/30/2019   Lab Results  Component Value Date   HDL 66.20 09/17/2022   HDL 76 07/31/2021   HDL 65 06/30/2019   Lab Results  Component Value Date   LDLCALC 95 09/17/2022   LDLCALC 109 (H) 07/31/2021   LDLCALC 100 (H) 06/30/2019   Lab Results  Component Value Date   TRIG 103.0 09/17/2022   TRIG 88 07/31/2021   TRIG 95 06/30/2019   Lab Results  Component Value Date   CHOLHDL 3 09/17/2022   CHOLHDL 2.7 07/31/2021   CHOLHDL 2.8 06/30/2019   No results found for: LDLDIRECT  Last metabolic panel Lab Results  Component Value Date   GLUCOSE 82 06/18/2023   NA 138 06/18/2023   K 4.2 06/18/2023   CL 105 06/18/2023   CO2 28 06/18/2023   BUN 18 06/18/2023   CREATININE 1.36 (H) 06/18/2023   GFRNONAA 51 (L) 06/18/2023   CALCIUM 9.5 06/18/2023   PROT 7.0  06/18/2023   ALBUMIN 4.8 06/18/2023   BILITOT 0.4 06/18/2023   ALKPHOS 32 (L) 06/18/2023   AST 15 06/18/2023   ALT 12 06/18/2023  ANIONGAP 5 06/18/2023    Lab Results  Component Value Date   HGBA1C 5.3 09/17/2022    Last CBC Lab Results  Component Value Date   WBC 6.2 06/18/2023   HGB 12.2 06/18/2023   HCT 35.1 (L) 06/18/2023   MCV 91.4 06/18/2023   MCH 31.8 06/18/2023   RDW 12.2 06/18/2023   PLT 426 (H) 06/18/2023    Lab Results  Component Value Date   TSH 3.34 09/17/2022    No results found for: PSA1, PSA  Last vitamin D  No results found for: MARIEN BOLLS, VD25OH  Lab Results  Component Value Date   COLORU yellow 03/04/2012   CLARITYU clear 03/04/2012   GLUCOSEUR neg 03/04/2012   BILIRUBINUR NEGATIVE 09/17/2022   KETONESU neg 03/04/2012   SPECGRAV <=1.005 03/04/2012   RBCUR 3+ 03/04/2012   PHUR 6.5 03/04/2012   PROTEINUR TRACE (A) 09/19/2022   UROBILINOGEN 0.2 09/17/2022   LEUKOCYTESUR TRACE (A) 09/17/2022    No results found for: LABMICR, MICROALBUR   At today's visit, we discussed treatment options, associated risk and benefits, and engage in counseling as needed.  Additionally the following were reviewed: Past medical records, past medical and surgical history, family and social background, as well as relevant laboratory results, imaging findings, and specialty notes, where applicable.  This message was generated using dictation software, and as a result, it may contain unintentional typos or errors.  Nevertheless, extensive effort was made to accurately convey at the pertinent aspects of the patient visit.    There may have been are other unrelated non-urgent complaints, but due to the busy schedule and the amount of time already spent with her, time does not permit to address these issues at today's visit. Another appointment may have or has been requested to review these additional issues.     Arvella Hummer, MD, MS

## 2023-10-01 ENCOUNTER — Ambulatory Visit: Admitting: Nurse Practitioner

## 2023-10-02 LAB — URINALYSIS W MICROSCOPIC + REFLEX CULTURE
Bacteria, UA: NONE SEEN /HPF
Bilirubin Urine: NEGATIVE
Glucose, UA: NEGATIVE
Hgb urine dipstick: NEGATIVE
Hyaline Cast: NONE SEEN /LPF
Ketones, ur: NEGATIVE
Leukocyte Esterase: NEGATIVE
Nitrites, Initial: NEGATIVE
Protein, ur: NEGATIVE
RBC / HPF: NONE SEEN /HPF (ref 0–2)
Specific Gravity, Urine: 1.013 (ref 1.001–1.035)
Squamous Epithelial / HPF: NONE SEEN /HPF (ref <=5)
WBC, UA: NONE SEEN /HPF (ref 0–5)
pH: 6 (ref 5.0–8.0)

## 2023-10-02 LAB — IRON, TOTAL/TOTAL IRON BINDING CAP
%SAT: 26 % (ref 16–45)
Iron: 111 ug/dL (ref 40–190)
TIBC: 429 ug/dL (ref 250–450)

## 2023-10-02 LAB — VITAMIN D 1,25 DIHYDROXY
Vitamin D 1, 25 (OH)2 Total: 45 pg/mL (ref 18–72)
Vitamin D2 1, 25 (OH)2: <8 pg/mL
Vitamin D3 1, 25 (OH)2: 45 pg/mL

## 2023-10-02 LAB — NO CULTURE INDICATED

## 2023-10-06 ENCOUNTER — Ambulatory Visit: Payer: Self-pay | Admitting: Family Medicine

## 2023-10-13 ENCOUNTER — Encounter: Payer: Self-pay | Admitting: Family Medicine

## 2023-10-15 ENCOUNTER — Ambulatory Visit: Admitting: Nurse Practitioner

## 2023-10-26 ENCOUNTER — Other Ambulatory Visit: Payer: Self-pay | Admitting: Nurse Practitioner

## 2023-10-26 DIAGNOSIS — Z3041 Encounter for surveillance of contraceptive pills: Secondary | ICD-10-CM

## 2023-10-27 NOTE — Telephone Encounter (Signed)
 Med refill request: Larin Fe 1/20 MG Last AEX: 09/17/22 TW Next AEX: 12/04/23 TW Last MMG (if hormonal med) 07/07/23 Refill authorized: Last Rx sent #[id number] with zero refills on 08/11/23 TW. Please approve or deny

## 2023-12-04 ENCOUNTER — Encounter: Payer: Self-pay | Admitting: Nurse Practitioner

## 2023-12-04 ENCOUNTER — Ambulatory Visit: Admitting: Nurse Practitioner

## 2023-12-04 VITALS — BP 122/80 | HR 83 | Ht 62.5 in | Wt 92.6 lb

## 2023-12-04 DIAGNOSIS — Z1331 Encounter for screening for depression: Secondary | ICD-10-CM

## 2023-12-04 DIAGNOSIS — Z3041 Encounter for surveillance of contraceptive pills: Secondary | ICD-10-CM

## 2023-12-04 DIAGNOSIS — Z01419 Encounter for gynecological examination (general) (routine) without abnormal findings: Secondary | ICD-10-CM | POA: Diagnosis not present

## 2023-12-04 MED ORDER — NORETHIN ACE-ETH ESTRAD-FE 1-20 MG-MCG PO TABS
1.0000 | ORAL_TABLET | Freq: Every day | ORAL | 3 refills | Status: DC
Start: 2023-12-04 — End: 2023-12-08

## 2023-12-04 NOTE — Progress Notes (Signed)
 MACKINLEY CASSADAY 1985-11-12 [id number]   History:  38 y.o. G0 presents for annual exam. Light cycle every few months on OCPs. 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 HSV, uses Valtrex  as needed, rare outbreaks.   Gynecologic History Patient's last menstrual period was 11/18/2023 (exact date). Period Cycle (Days): 28 Period Duration (Days): 3-4 Period Pattern: Regular Menstrual Flow: Light Menstrual Control: Tampon Menstrual Control Change Freq (Hours): 3 Dysmenorrhea: (!) Mild (back pain on onset) Dysmenorrhea Symptoms: Headache, Cramping Contraception/Family planning: OCP (estrogen/progesterone ) Sexually active: Yes  Health Maintenance Last Pap: 07/31/2021. Results were: Normal neg HPV Last mammogram: 07/07/2023. Results were: Normal Last colonoscopy: Not indicated Last Dexa: Not indicated     12/04/2023    2:38 PM  Depression screen PHQ 2/9  Decreased Interest 0  Down, Depressed, Hopeless 0  PHQ - 2 Score 0     Past medical history, past surgical history, family history and social history were all reviewed and documented in the EPIC chart. 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   ROS:  A ROS was performed and pertinent positives and negatives are included.  Exam:  Vitals:   12/04/23 1429  BP: 122/80  Pulse: 83  SpO2: 100%  Weight: 92 lb 9.6 oz (42 kg)  Height: 5' 2.5 (1.588 m)      Body mass index is 16.67 kg/m.  General appearance:  Normal Thyroid:  Symmetrical, normal in size, without palpable masses or nodularity. Respiratory  Auscultation:  Clear without wheezing or rhonchi Cardiovascular  Auscultation:  Regular rate, without rubs, murmurs or gallops  Edema/varicosities:  Not grossly evident Abdominal  Soft,nontender, without masses, guarding or rebound.  Liver/spleen:  No organomegaly noted  Hernia:  None appreciated  Skin  Inspection:  Grossly  normal Breasts: Examined lying and sitting.   Right: Without masses, retractions, discharge or axillary adenopathy.   Left: Without masses, retractions, discharge or axillary adenopathy. Pelvic: External genitalia:  no lesions              Urethra:  normal appearing urethra with no masses, tenderness or lesions              Bartholins and Skenes: normal                 Vagina: normal appearing vagina with normal color and discharge, no lesions              Cervix: no lesions Bimanual Exam:  Uterus:  no masses or tenderness              Adnexa: no mass, fullness, tenderness              Rectovaginal: Deferred              Anus:  normal, no lesions  Dereck Keas, CMA present as chaperone.   Assessment/Plan:  38 y.o. G0 for annual exam.   Well female exam with routine gynecological exam - Education provided on SBEs, importance of preventative screenings, current guidelines, high calcium diet, regular exercise, and multivitamin daily. Labs with PCP.  Encounter for surveillance of contraceptive pills - Plan: norethindrone -ethinyl estradiol-FE (JUNEL FE 1/20) 1-20 MG-MCG tablet daily. Taking as prescribed. Refill x 1 year provided.   Screening for cervical cancer - 2012 LEEP for high-grade dysplasia with free margins, 04/2011 LGSIL, 09/2011 ASCUS negative HPV, subsequent paps normal. Will repeat at 3-year interval per guidelines.   Return in about 1 year (  around 12/03/2024) for Annual.      Annabella DELENA Shutter DNP, 2:47 PM 12/04/2023

## 2023-12-08 ENCOUNTER — Other Ambulatory Visit: Payer: Self-pay

## 2023-12-08 DIAGNOSIS — Z3041 Encounter for surveillance of contraceptive pills: Secondary | ICD-10-CM

## 2023-12-08 MED ORDER — NORETHIN ACE-ETH ESTRAD-FE 1-20 MG-MCG PO TABS: 1.0000 | ORAL_TABLET | Freq: Every day | ORAL | 3 refills | Status: AC

## 2023-12-08 NOTE — Telephone Encounter (Signed)
 Patient sent mychart message requesting birth control be sent to [facility].

## 2024-01-13 ENCOUNTER — Other Ambulatory Visit: Payer: Self-pay | Admitting: Medical Genetics

## 2024-01-13 DIAGNOSIS — Z006 Encounter for examination for normal comparison and control in clinical research program: Secondary | ICD-10-CM

## 2024-09-29 ENCOUNTER — Encounter: Admitting: Family Medicine

## 2024-10-07 ENCOUNTER — Encounter: Admitting: Family Medicine
